# Patient Record
Sex: Female | Born: 1985
Health system: Southern US, Community
[De-identification: ages and names within clinical notes are randomized; demographics above are authoritative.]

## PROBLEM LIST (undated history)

## (undated) DIAGNOSIS — D509 Iron deficiency anemia, unspecified: Secondary | ICD-10-CM

## (undated) DIAGNOSIS — J45909 Unspecified asthma, uncomplicated: Secondary | ICD-10-CM

## (undated) DIAGNOSIS — E538 Deficiency of other specified B group vitamins: Secondary | ICD-10-CM

## (undated) DIAGNOSIS — F329 Major depressive disorder, single episode, unspecified: Secondary | ICD-10-CM

## (undated) DIAGNOSIS — F419 Anxiety disorder, unspecified: Secondary | ICD-10-CM

## (undated) DIAGNOSIS — E282 Polycystic ovarian syndrome: Secondary | ICD-10-CM

## (undated) DIAGNOSIS — O24419 Gestational diabetes mellitus in pregnancy, unspecified control: Secondary | ICD-10-CM

## (undated) DIAGNOSIS — K219 Gastro-esophageal reflux disease without esophagitis: Secondary | ICD-10-CM

## (undated) DIAGNOSIS — F32A Depression, unspecified: Secondary | ICD-10-CM

## (undated) DIAGNOSIS — Z803 Family history of malignant neoplasm of breast: Secondary | ICD-10-CM

## (undated) HISTORY — DX: Depression, unspecified: F32.A

## (undated) HISTORY — DX: Unspecified asthma, uncomplicated: J45.909

## (undated) HISTORY — DX: Gestational diabetes mellitus in pregnancy, unspecified control: O24.419

## (undated) HISTORY — DX: Iron deficiency anemia, unspecified: D50.9

## (undated) HISTORY — DX: Family history of malignant neoplasm of breast: Z80.3

## (undated) HISTORY — DX: Deficiency of other specified B group vitamins: E53.8

---
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
## 2007-05-03 ENCOUNTER — Encounter: Payer: Self-pay | Admitting: Internal Medicine

## 2007-05-03 ENCOUNTER — Ambulatory Visit: Payer: Self-pay | Admitting: Internal Medicine

## 2007-05-03 ENCOUNTER — Other Ambulatory Visit: Admission: RE | Admit: 2007-05-03 | Discharge: 2007-05-03 | Payer: Self-pay | Admitting: Internal Medicine

## 2007-05-03 DIAGNOSIS — N946 Dysmenorrhea, unspecified: Secondary | ICD-10-CM | POA: Insufficient documentation

## 2007-05-03 DIAGNOSIS — E049 Nontoxic goiter, unspecified: Secondary | ICD-10-CM | POA: Insufficient documentation

## 2007-05-03 LAB — CONVERTED CEMR LAB
Thyroglobulin Ab: 34.9 (ref 0.0–60.0)
Thyroperoxidase Ab SerPl-aCnc: <25 (ref 0.0–60.0)

## 2007-05-08 LAB — CONVERTED CEMR LAB
ALT: 12 units/L (ref 0–35)
AST: 15 units/L (ref 0–37)
Albumin: 3.5 g/dL (ref 3.5–5.2)
Alkaline Phosphatase: 59 units/L (ref 39–117)
BUN: 11 mg/dL (ref 6–23)
Basophils Absolute: 0.1 10*3/uL (ref 0.0–0.1)
Basophils Relative: 0.9 % (ref 0.0–1.0)
Bilirubin, Direct: 0.1 mg/dL (ref 0.0–0.3)
CO2: 25 meq/L (ref 19–32)
Calcium: 9.2 mg/dL (ref 8.4–10.5)
Chloride: 107 meq/L (ref 96–112)
Cholesterol: 133 mg/dL (ref 0–200)
Creatinine, Ser: 0.8 mg/dL (ref 0.4–1.2)
Eosinophils Absolute: 0.1 10*3/uL (ref 0.0–0.6)
Eosinophils Relative: 1.2 % (ref 0.0–5.0)
Free T4: 1 ng/dL (ref 0.6–1.6)
GFR calc Af Amer: 119 mL/min
GFR calc non Af Amer: 98 mL/min
Glucose, Bld: 90 mg/dL (ref 70–99)
HCT: 37.3 % (ref 36.0–46.0)
HDL: 31.3 mg/dL — ABNORMAL LOW (ref >39.0)
Hemoglobin: 12.8 g/dL (ref 12.0–15.0)
LDL Cholesterol: 90 mg/dL (ref 0–99)
Lymphocytes Relative: 24.1 % (ref 12.0–46.0)
MCHC: 34.3 g/dL (ref 30.0–36.0)
MCV: 83.6 fL (ref 78.0–100.0)
Monocytes Absolute: 0.4 10*3/uL (ref 0.2–0.7)
Monocytes Relative: 5.6 % (ref 3.0–11.0)
Neutro Abs: 4.3 10*3/uL (ref 1.4–7.7)
Neutrophils Relative %: 68.2 % (ref 43.0–77.0)
Platelets: 228 10*3/uL (ref 150–400)
Potassium: 3.8 meq/L (ref 3.5–5.1)
RBC: 4.46 M/uL (ref 3.87–5.11)
RDW: 11.8 % (ref 11.5–14.6)
Sodium: 139 meq/L (ref 135–145)
TSH: 0.99 microintl units/mL (ref 0.35–5.50)
Total Bilirubin: 0.6 mg/dL (ref 0.3–1.2)
Total CHOL/HDL Ratio: 4.2
Total Protein: 6.8 g/dL (ref 6.0–8.3)
Triglycerides: 60 mg/dL (ref 0–149)
VLDL: 12 mg/dL (ref 0–40)
WBC: 6.5 10*3/uL (ref 4.5–10.5)

## 2007-05-24 ENCOUNTER — Encounter: Payer: Self-pay | Admitting: Internal Medicine

## 2007-05-25 ENCOUNTER — Telehealth: Payer: Self-pay | Admitting: *Deleted

## 2007-07-19 ENCOUNTER — Ambulatory Visit: Payer: Self-pay | Admitting: Internal Medicine

## 2007-07-19 DIAGNOSIS — R51 Headache: Secondary | ICD-10-CM | POA: Insufficient documentation

## 2007-07-19 DIAGNOSIS — R519 Headache, unspecified: Secondary | ICD-10-CM | POA: Insufficient documentation

## 2007-07-19 DIAGNOSIS — E786 Lipoprotein deficiency: Secondary | ICD-10-CM | POA: Insufficient documentation

## 2007-07-19 DIAGNOSIS — J069 Acute upper respiratory infection, unspecified: Secondary | ICD-10-CM | POA: Insufficient documentation

## 2007-08-17 ENCOUNTER — Ambulatory Visit: Payer: Self-pay | Admitting: Internal Medicine

## 2007-08-17 DIAGNOSIS — N926 Irregular menstruation, unspecified: Secondary | ICD-10-CM | POA: Insufficient documentation

## 2007-08-17 DIAGNOSIS — N83209 Unspecified ovarian cyst, unspecified side: Secondary | ICD-10-CM | POA: Insufficient documentation

## 2007-08-23 ENCOUNTER — Telehealth: Payer: Self-pay | Admitting: Internal Medicine

## 2007-09-26 ENCOUNTER — Telehealth: Payer: Self-pay | Admitting: Internal Medicine

## 2007-10-16 ENCOUNTER — Ambulatory Visit (HOSPITAL_COMMUNITY): Admission: RE | Admit: 2007-10-16 | Discharge: 2007-10-16 | Payer: Self-pay | Admitting: Obstetrics and Gynecology

## 2009-06-16 ENCOUNTER — Ambulatory Visit: Payer: Self-pay | Admitting: Internal Medicine

## 2009-06-16 DIAGNOSIS — K645 Perianal venous thrombosis: Secondary | ICD-10-CM | POA: Insufficient documentation

## 2009-12-21 ENCOUNTER — Emergency Department: Payer: Self-pay | Admitting: Emergency Medicine

## 2009-12-24 ENCOUNTER — Ambulatory Visit: Payer: Self-pay | Admitting: Internal Medicine

## 2009-12-24 DIAGNOSIS — M79609 Pain in unspecified limb: Secondary | ICD-10-CM | POA: Insufficient documentation

## 2009-12-24 DIAGNOSIS — M542 Cervicalgia: Secondary | ICD-10-CM | POA: Insufficient documentation

## 2009-12-29 ENCOUNTER — Telehealth: Payer: Self-pay | Admitting: *Deleted

## 2010-01-08 ENCOUNTER — Encounter: Payer: Self-pay | Admitting: Internal Medicine

## 2010-02-02 ENCOUNTER — Encounter: Payer: Self-pay | Admitting: Internal Medicine

## 2010-03-02 ENCOUNTER — Encounter: Payer: Self-pay | Admitting: Internal Medicine

## 2010-07-12 ENCOUNTER — Encounter: Payer: Self-pay | Admitting: Endocrinology

## 2010-07-21 NOTE — Letter (Signed)
Summary: [facility]  [facility]   Imported By: Maryln Gottron 02/13/2010 12:16:09  _____________________________________________________________________  External Attachment:    Type:   Image     Comment:   External Document

## 2010-07-21 NOTE — Progress Notes (Signed)
Summary: diag  Phone Note Other Incoming Call back at [phone]   Call placed by: Shelby Aguilar Call placed to: Shelby Aguilar Summary of Call: they need more specific dig in order to complete Shelby Aguilar paper work for a medical leave at [employer].  Would you please fax to [phone], make atten to Shelby Aguilar. Initial call taken by: Shelby Aguilar,  September 26, 2007 8:55 AM  Follow-up for Phone Call        Per md- Call pt and see what the GYN said and let her know what is going on. Left message to call back. Follow-up by: Shelby Aguilar, CMA,  September 26, 2007 12:54 PM  Additional Follow-up for Phone Call Additional follow up Details #1::        Spoke to pt- pt saw a gyn- they didn't do anything just talked to her. She doesn't remember who she seen but they thought she was there because she was pregnant. Her grandmother was suppose to make her another appt but hasn't yet. I told her to call Shelby Aguilar and let her know what is going on and to call us back once she gets seen with a gyn about this problem then we would be able to do another letter. I also told pt that we would call over to [facility] as well and let her know that we have referred her to a gyn and once we get a report from them, we will be happy to do another letter. I spoke with Shelby Aguilar at [facility] and she said that the would hold her file until they get the letter from [place]. Additional Follow-up by: Shelby Aguilar, CMA,  September 26, 2007 2:26 PM

## 2010-07-21 NOTE — Letter (Signed)
Summary: Out of Work  Adult nurse at [employer]  [address]   [place], [place] [address]   Phone: [phone]  Fax: [phone]    December 24, 2009   Employee:  LESLIE JESTER    To Whom It May Concern:   For Medical reasons, please excuse the above named employee from  physical work for the following dates:  Start:   December 24 2009  End:   December 31 2009  If you need additional information, please feel free to contact our office.         Sincerely,    Madelin Headings MD

## 2010-07-21 NOTE — Letter (Signed)
Summary: Out of Work  Adult nurse at [employer]  [address]   [place], [place] [address]   Phone: [phone]  Fax: [phone]    December 24, 2009   Employee:  ALIVEAH GALLANT    To Whom It May Concern:   For Medical reasons, please excuse the above named employee from work for the following dates:  Start:    End:    If you need additional information, please feel free to contact our office.         Sincerely,    Madelin Headings MD

## 2010-07-21 NOTE — Letter (Signed)
Summary: medical release  medical release   Imported By: Kassie Mends 06/16/2007 14:58:18  _____________________________________________________________________  External Attachment:    Type:   Image     Comment:   medical release

## 2010-07-21 NOTE — Assessment & Plan Note (Signed)
Summary: panosh pt w rectal bleed/mae   Vital Signs:  Patient profile:   25 year old female Height:      62.75 inches Weight:      154 pounds BMI:     27.60 Temp:     98.2 degrees F oral Pulse rate:   72 / minute Resp:     14 per minute BP sitting:   110 / 76  (left arm)  Vitals Entered By: Willy Eddy, LPN (June 16, 2009 4:11 PM)  CC:  c/obright red rectal bleeding with bm 3 times.  History of Present Illness: PRESENTS FOR RECTAL BLEEDING no hx of hemmorhoids has been mildly constipated has a hx anal sex in the past had several episodes of bleeding with blood on the tissue and in the toilet had a similar episode that was diagnosed as a hemmorrhoid   Problems Prior to Update: 1)  Ovarian Cyst  (ICD-620.2) 2)  Irregular Menstrual Cycle  (ICD-626.4) 3)  Low Hdl  (ICD-272.5) 4)  Headache  (ICD-784.0) 5)  Uri  (ICD-465.9) 6)  Oral Contraception  (ICD-V25.41) 7)  Dysmenorrhea  (ICD-625.3) 8)  Well Woman  (ICD-V70.0) 9)  Goiter, Unspecified  (ICD-240.9) 10)  Family History Diabetes 1st Degree Relative  (ICD-V18.0)  Medications Prior to Update: 1)  None  Current Medications (verified): 1)  None  Allergies (verified): 1)  ! Penicillin  Past History:  Family History: Last updated: 05/03/2007 Family History Diabetes 1st degree relative Family History Hypertension no blood clots   Social History: Last updated: 05/03/2007 Occupation: Consulting civil engineer at [employer]  Never Smoked  Risk Factors: Smoking Status: never (05/03/2007)  Past medical, surgical, family and social histories (including risk factors) reviewed, and no changes noted (except as noted below).  Past Medical History: Reviewed history from 07/19/2007 and no changes required. ?EIA 9#1/2 BW  Family History: Reviewed history from 05/03/2007 and no changes required. Family History Diabetes 1st degree relative Family History Hypertension no blood clots   Social History: Reviewed history from  05/03/2007 and no changes required. Occupation: Consulting civil engineer at [employer]  Review of Systems       The patient complains of hematochezia.  The patient denies anorexia, fever, weight loss, weight gain, vision loss, decreased hearing, hoarseness, chest pain, syncope, dyspnea on exertion, peripheral edema, prolonged cough, headaches, hemoptysis, abdominal pain, melena, severe indigestion/heartburn, hematuria, incontinence, genital sores, muscle weakness, suspicious skin lesions, transient blindness, difficulty walking, depression, unusual weight change, abnormal bleeding, enlarged lymph nodes, angioedema, and breast masses.    Physical Exam  General:  Well-developed,well-nourished,in no acute distress; alert,appropriate and cooperative throughout examination Head:  normocephalic and atraumatic.   Eyes:  vision grossly intact, pupils equal, and pupils round.   Ears:  R ear normal, L ear normal, and ear piercing(s) noted.   Nose:  mucosal erythema and mucosal edema.  face nt Mouth:  pharynx pink and moist.  no lesions Neck:  No deformities, masses, or tenderness noted. Lungs:  Normal respiratory effort, chest expands symmetrically. Lungs are clear to auscultation, no crackles or wheezes. Heart:  Normal rate and regular rhythm. S1 and S2 normal without gallop, murmur, click, rub or other extra sounds. Rectal:  internal hemorrhoid(s).   no external lesions Genitalia:  normal introitus and no external lesions.    no apparent vaginal bleeding   Impression & Recommendations:  Problem # 1:  INTERNAL THROMBOSED HEMORRHOIDS (ICD-455.1)   pt gave informed consent and anascopy was performed with my nurse present internal thrombosed hemorrhoids  were noted  and scarring from prior hemorrhoids noted pt informed of results  Orders: Anoscopy ([address])  Complete Medication List: 1)  Hydrocortisone Ace-pramoxine 2.5-1 % Crea (Hydrocortisone ace-pramoxine) .... Apply pr internally two times a day for  7 days  Patient Instructions: 1)  keep stools soft and use cream for one week even if the bleeding stops Prescriptions: HYDROCORTISONE ACE-PRAMOXINE 2.5-1 % CREA (HYDROCORTISONE ACE-PRAMOXINE) apply PR internally two times a day for 7 days  #1 tube x 1   Entered and Authorized by:   Stacie Glaze MD   Signed by:   Stacie Glaze MD on 06/16/2009   Method used:   Electronically to        [facility]. [address] [phone]* (retail)       [address] [place], [place]  [id number]       Ph: [phone]       Fax: [phone]   RxID:   [id number]

## 2010-07-21 NOTE — Consult Note (Signed)
Summary: [facility]  [facility]   Imported By: Maryln Gottron 01/20/2010 10:43:14  _____________________________________________________________________  External Attachment:    Type:   Image     Comment:   External Document
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
## 2010-07-21 NOTE — Progress Notes (Signed)
Summary: wants referral  Phone Note Call from Patient Call back at [phone]   Caller: linda---grandmother---live call Summary of Call: still having back pain. was told by dr Fabian Sharp that a referral to a chiropractor can be done. please advise. she is unable to come in today. Initial call taken by: Warnell Forester,  December 29, 2009 8:41 AM  Follow-up for Phone Call        I would  do orthopedist /( and then  Physical therapy) referral first.  She can  do a chiro appt on her own.   May we do a referral ? Follow-up by: Madelin Headings MD,  December 29, 2009 5:11 PM  Additional Follow-up for Phone Call Additional follow up Details #1::        Pt's grandmother aware and order sent to [facility]. Pt had appt on Wed with Dr. Fabian Sharp but we went ahead and cancelled it. Additional Follow-up by: Romualdo Bolk, CMA (AAMA),  December 29, 2009 5:23 PM

## 2010-07-24 NOTE — Letter (Signed)
Summary: [facility]  [facility]   Imported By: Maryln Gottron 03/18/2010 09:14:04  _____________________________________________________________________  External Attachment:    Type:   Image     Comment:   External Document

## 2010-11-06 NOTE — Letter (Signed)
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  Unfortunately, she  developed a medical problem during her study abroad at [place] that  required her to seek care at the local hospital.  She did need to return  home for further care and I have since  referred her to a specialist.   If you require more specifics about her medical condition, please let us  know.  I suspect that she would be able to return to classes within a  month or so, however, because of her study abroad, she would not be able  to return to [place].  Therefore, she is requesting withdrawal for  medical reasons, which I support.   Please let us know if we can be of more help.    Sincerely,      Shelby Mends. Panosh, MD  Electronically Signed    WKP/MedQ  DD: 08/24/2007  DT: 08/24/2007  Job #: [id number]

## 2011-10-20 ENCOUNTER — Observation Stay: Payer: Self-pay | Admitting: Obstetrics and Gynecology

## 2011-10-20 LAB — URINALYSIS, COMPLETE
Bilirubin,UR: NEGATIVE
Glucose,UR: 150 mg/dL (ref 0–75)
Ketone: NEGATIVE
Leukocyte Esterase: NEGATIVE
Nitrite: NEGATIVE
Ph: 6 (ref 4.5–8.0)
Protein: 100
RBC,UR: 3281 /HPF (ref 0–5)
Specific Gravity: 1.021 (ref 1.003–1.030)
Squamous Epithelial: 3
WBC UR: 34 /HPF (ref 0–5)

## 2011-10-23 LAB — URINE CULTURE

## 2011-12-14 ENCOUNTER — Inpatient Hospital Stay: Payer: Self-pay | Admitting: Obstetrics & Gynecology

## 2011-12-15 LAB — CBC WITH DIFFERENTIAL/PLATELET
Basophil #: 0 x10 3/mm 3 (ref 0.0–0.1)
Basophil %: 0.1 %
Eosinophil #: 0.3 x10 3/mm 3 (ref 0.0–0.7)
Eosinophil %: 1.9 %
HCT: 37.2 % (ref 35.0–47.0)
HGB: 12.4 g/dL (ref 12.0–16.0)
Lymphocyte %: 13.8 %
Lymphs Abs: 2 x10 3/mm 3 (ref 1.0–3.6)
MCH: 27.8 pg (ref 26.0–34.0)
MCHC: 33.2 g/dL (ref 32.0–36.0)
MCV: 84 fL (ref 80–100)
Monocyte #: 0.9 x10 3/mm (ref 0.2–0.9)
Monocyte %: 6.6 %
Neutrophil #: 11 x10 3/mm 3 — ABNORMAL HIGH (ref 1.4–6.5)
Neutrophil %: 77.6 %
Platelet: 195 x10 3/mm 3 (ref 150–440)
RBC: 4.45 X10 6/mm 3 (ref 3.80–5.20)
RDW: 14.2 % (ref 11.5–14.5)
WBC: 14.2 x10 3/mm 3 — ABNORMAL HIGH (ref 3.6–11.0)

## 2011-12-16 LAB — HEMATOCRIT: HCT: 25.8 % — ABNORMAL LOW (ref 35.0–47.0)

## 2014-10-13 NOTE — Consult Note (Signed)
PATIENT NAME:  Shelby Aguilar, Shelby Aguilar MR#:  [id number] DATE OF BIRTH:  [birth year]  DATE OF CONSULTATION:  10/21/2011  REFERRING PHYSICIAN:  Dr. Bonney AidStaebler CONSULTING PHYSICIAN:  Suszanne ConnersMichael R. Evelene CroonWolff, MD  REASON FOR CONSULTATION: Kidney stones.   HISTORY OF PRESENT ILLNESS: [name] is a 29 year old African American female with sudden onset of right lower quadrant abdominal pain radiating to her right upper quadrant last night. She denied nausea, vomiting, fever, chills, or hematuria. She denies past history of kidney stones or urinary tract infections. She had an abdominal ultrasound today which by report indicated moderate to severe right hydronephrosis but an obstructing stone was not identified. At the present time the patient is pain free.   ALLERGIES: No drug allergies.   CURRENT MEDICATIONS: Prenatal vitamins.   PAST SURGICAL HISTORY: No previous surgical procedures.   SOCIAL HISTORY: Patient denied tobacco or alcohol use.   FAMILY HISTORY: Negative for kidney disease or stones.   PAST AND CURRENT MEDICAL CONDITIONS: Patient denied heart disease, lung disease, diabetes or kidney disease. She does have a history of a questionable hyperthyroidism.   PHYSICAL EXAMINATION:  GENITOURINARY: She had a gravid uterus consistent with 27 weeks of pregnancy. No CVA tenderness.   LABORATORY, DIAGNOSTIC AND RADIOLOGICAL DATA: Urinalysis indicated 34 WBCs per field and +3 blood.   IMPRESSION:  1. Right hydronephrosis per ultrasound.  2. Possible right ureterolithiasis.   SUGGESTIONS:  1. Discussed kidney stone disease during pregnancy with the patient. Specifically discussed indications for urological intervention with her such as intractable pain, intractable nausea and vomiting and urinary sepsis.  2. Patient is pain free at the present time and may be discharged home when stable from the obstetric standpoint.  3. Patient was instructed to drink plenty of fluids and strain her urine. 4. If she  develops any significant symptoms that could complicate her pregnancy then she is to contact myself or her OB/GYN.  5. After she delivers she will need a KUB to determine if the stone is still present.  ____________________________ Suszanne ConnersMichael R. Evelene CroonWolff, MD mrw:cms D: 10/21/2011 16:38:44 ET T: 10/22/2011 08:11:37 ET JOB#: [id number]  cc: Suszanne ConnersMichael R. Evelene CroonWolff, MD, <Dictator> Florina OuAndreas Aguilar. Bonney AidStaebler, MD Orson ApeMICHAEL R Sorren Vallier MD ELECTRONICALLY SIGNED 10/22/2011 11:13

## 2014-10-13 NOTE — Consult Note (Signed)
Brief Consult Note: Diagnosis: R hydronephrosis/possible ureterolithiasis.   Patient was seen by consultant.   Consult note dictated.   Orders entered.   Discussed with Attending MD.   Comments: Kidney stone management during pregnancy discussed with patient and she was given a brochure to take home. Patient is pain-free now and may be discharged home from my standpoint.  Electronic Signatures: Orson ApeWolff, Michael R (MD)  (Signed 02-May-13 16:27)  Authored: Brief Consult Note   Last Updated: 02-May-13 16:27 by Orson ApeWolff, Michael R (MD)

## 2014-10-13 NOTE — Op Note (Signed)
PATIENT NAME:  Shelby Aguilar, Shelby Aguilar MR#:  [id number] DATE OF BIRTH:  03-21-1986  DATE OF PROCEDURE:  12/15/2011  PREOPERATIVE DIAGNOSES:  1. Term intrauterine pregnancy at 39 weeks 5 days gestation.  2. Failed induction of labor with active phase arrest deemed secondary to cephalopelvic disproportion.   POSTOPERATIVE DIAGNOSES:  1. Term intrauterine pregnancy at 39 weeks 5 days gestation.  2. Failed induction of labor with active phase arrest deemed secondary to cephalopelvic disproportion.  3. True knot in umbilical cord.   PROCEDURE: Primary low transverse C-section via Pfannenstiel skin incision. .   ANESTHESIA: Epidural.   PRIMARY SURGEON: Florina Ou. Bonney Aid, MD  ASSISTANT: Shella Maxim, CNM    ESTIMATED BLOOD LOSS: 650 mL.   OPERATIVE FLUIDS: 100 mL.   DRAINS OR TUBES: Foley to gravity drainage. On-Q catheter system.   IMPLANTS: None.   SPECIMENS REMOVED: None.   COMPLICATIONS: None.   INTRAOPERATIVE FINDINGS: Normal tubes, ovaries, and uterus. Delivery of a liveborn female infant weighing 3990 grams, 8 pounds 13 ounces, Apgars 9 and 9.   POSTOPERATIVE CONDITION: Stable.   PROCEDURE IN DETAIL: Risks, benefits, and alternatives of the procedure were discussed with the patient prior to proceeding to the Operating Room. The patient displayed reassuring fetal surveillance but had stalled labor at approximately 5 cm cervical dilation with no cervical change despite adequate MVU's. After discussion of further attempts to labor versus proceeding with operative delivery via cesarean section, the patient opted to proceed with cesarean section for delivery. Patient was taken back to the Operating Room her epidural was dosed. Once the level of anesthetic was noted to be adequate the patient was prepped and draped in the usual sterile fashion and positioned in the supine position. A Pfannenstiel skin incision was made 2 cm above the umbilicus and carried down sharply to the level of the  rectus fascia with the knife. The fascia was incised in the midline using the knife. The fascial incision was then extended using Mayo scissors. The superior border of the rectus fascia was grasped with two Kocher clamps. The underlying rectus muscle was dissected off the rectus fascia bluntly and the median raphe incised using Mayo scissors. Attention was then turned to the inferior border of the rectus fascia which was dissected off the rectus muscles in a similar fashion. The midline was identified and the peritoneum was entered bluntly. The peritoneal incision was stretched using manual traction. A bladder blade was placed. The operator's hand was placed into the abdomen noting the uterus to be in nonrotated position. A bladder flap was created using Metzenbaum scissors and further developed digitally and the bladder blade was replaced. A low transverse hysterotomy incision was made. The uterus was entered bluntly using the operator's finger. Clear fluid was noted. The infant's head was noted to be in the OA position. The vertex was grasped, flexed, brought to the incision, and delivered atraumatically using fundal pressure. The infant's cord was clamped and cut and the infant was suctioned before being passed to the awaiting pediatrician. Following delivery of the infant, cord blood was obtained. The placenta was delivered using manual extraction. The uterus was exteriorized and wiped clean of clots and debris. The hysterotomy incision was closed in two layers using 0 Vicryl with the first layer being a running locked stitch, the second a horizontal imbricating. Following closure the hysterotomy was inspected and noted to be hemostatic. The uterus was returned to the abdomen. The peritoneal gutters were wiped clean of clots and debris using two  moist laps. The peritoneum was then closed using a 2-0 Vicryl in a running fashion. The rectus muscles were inspected and noted to be hemostatic. The On-Q catheter  system was then placed using the On-Q trocars. The On-Q trocars were introduced a midline approximately 4 cm superior to the superior border of the Pfannenstiel skin incision. The trocars were removed and the catheters threaded through the introducers and introducers were subsequently removed. Following this the rectus fascia was closed using a running stitch of #1 loop PDS. Subcutaneous tissue was irrigated and hemostasis achieved using the Bovie. The skin was closed using a 4-0 Monocryl in a subcuticular fashion. The On-Q catheters were dressed with Dermabond, Steri-Strips and a Tegaderm. The Pfannenstiel skin incision was dressed using Steri-Strips. Following this the On-Q catheters were bolused with 5 mL of 0.5% bupivacaine each. Sponge, needle, and instrument counts were correct x2. The patient tolerated the procedure well and was taken to recovering room in stable condition.   ____________________________ Florina OuAndreas M. Bonney AidStaebler, MD ams:cms D: 12/15/2011 23:52:35 ET T: 12/16/2011 09:45:56 ET JOB#: [id number]  cc: Florina OuAndreas M. Bonney AidStaebler, MD, <Dictator>  Carmel SacramentoANDREAS Cathrine MusterM Raela Bohl MD ELECTRONICALLY SIGNED 12/25/2011 8:34

## 2014-10-22 DIAGNOSIS — E282 Polycystic ovarian syndrome: Secondary | ICD-10-CM | POA: Insufficient documentation

## 2014-10-29 NOTE — H&P (Signed)
L&D Evaluation:  History Expanded:   HPI 29 yo G1 at 1432 1/7 weeks with EDD of 12/18/11 per early US. Pt presents with c/o right low abdominal and back pain and noting blood in toilet after voiding. [facility], ASCUS + HPV pap (Colp neg, repeat pap neg/+ HPV), UTI tx in October, elevated 1 hr with normal 3 hr GTT.    Blood Type A positive    Group B Strep Results (Result >5wks must be treated as unknown) unknown/result > 5 weeks ago    Maternal HIV Negative    Maternal Syphilis Ab Nonreactive    Maternal Varicella Immune    Rubella Results immune    Maternal T-Dap Immune    Patient's Medical History enlarged thyroid (labs nl this pregnancy)    Medications Pre Natal Vitamins    Allergies PCN    Social History none    Family History Non-Contributory   ROS:   ROS see HPI   Exam:   Vital Signs stable    General no apparent distress    Mental Status clear    Chest clear    Heart no murmur/gallop/rubs    Abdomen gravid, tender RLQ    Back no CVAT    Pelvic closed per RN, no vaginal blood noted while checking    Mebranes Intact    FHT normal rate with no decels, category 1    Ucx absent    Other UA: 150 glucose, 3+ blood, 100 protein, 3281 RBCs, 34 WBC, 1+ bacteria, 3 epithelial Renal US: right hydronephrosis, questionable right nonobstructive nephrolithiasis   Impression:   Impression nephrolithiasis   Plan:   Comments Urology consult in am Percocet prn for pain Transfer to mother-baby floor   Electronic Signatures: Vella KohlerBrothers, Ioane Bhola K (CNM)  (Signed 02-May-13 03:50)  Authored: L&D Evaluation   Last Updated: 02-May-13 03:50 by Vella KohlerBrothers, Cataleia Gade K (CNM)

## 2015-06-24 DIAGNOSIS — E669 Obesity, unspecified: Secondary | ICD-10-CM | POA: Insufficient documentation

## 2017-06-17 ENCOUNTER — Encounter: Payer: Self-pay | Admitting: Obstetrics and Gynecology

## 2017-06-17 ENCOUNTER — Ambulatory Visit (INDEPENDENT_AMBULATORY_CARE_PROVIDER_SITE_OTHER): Payer: 59 | Admitting: Obstetrics and Gynecology

## 2017-06-17 DIAGNOSIS — Z1239 Encounter for other screening for malignant neoplasm of breast: Secondary | ICD-10-CM

## 2017-06-17 DIAGNOSIS — Z113 Encounter for screening for infections with a predominantly sexual mode of transmission: Secondary | ICD-10-CM | POA: Diagnosis not present

## 2017-06-17 DIAGNOSIS — Z1231 Encounter for screening mammogram for malignant neoplasm of breast: Secondary | ICD-10-CM | POA: Diagnosis not present

## 2017-06-17 DIAGNOSIS — Z01419 Encounter for gynecological examination (general) (routine) without abnormal findings: Secondary | ICD-10-CM | POA: Diagnosis not present

## 2017-06-17 DIAGNOSIS — Z124 Encounter for screening for malignant neoplasm of cervix: Secondary | ICD-10-CM | POA: Diagnosis not present

## 2017-06-17 NOTE — Progress Notes (Signed)
Patient ID: Shelby Aguilar, female   DOB: 22-Dec-1985, 31 y.o.   MRN: [id number]     Gynecology Annual Exam  PCP: Madelin HeadingsPanosh, Wanda K, MD  Chief Complaint:  Chief Complaint  Patient presents with  . Gynecologic Exam    Discuss fertility    History of Present Illness: Patient is a 31 y.o. G1P1001 presents for annual exam. The patient has no complaints today.   LMP: Patient's last menstrual period was 06/11/2017. Regular monthly, 4-5 days, light flow, positive moliminal symptoms  The patient is sexually active. She currently uses none for contraception. She denies dyspareunia.  The patient does perform self breast exams.  There is notable family history of breastcancer in her paternal grandmother in her 6440's.  The patient wears seatbelts: yes.   The patient has regular exercise: no, but just got gym membership.   The patient denies current symptoms of depression.    Review of Systems: ROS  Past Medical History:  Past Medical History:  Diagnosis Date  . Asthma     Past Surgical History:  Past Surgical History:  Procedure Laterality Date  . CESAREAN SECTION  2013    Gynecologic History:  Patient's last menstrual period was 06/11/2017. Contraception: none Nexplanon removed 02/12/2015 Last Pap:  03/18/2014 negative colposcopy 07/10/2013 CIN II 06/06/2013 ASCUS HPV positive 01/26/2012 NIL  Obstetric History: G1P1001  Family History:  Family History  Problem Relation Age of Onset  . Breast cancer Paternal Grandmother 5240    Social History:  Social History   Socioeconomic History  . Marital status: Married    Spouse name: Not on file  . Number of children: Not on file  . Years of education: Not on file  . Highest education level: Not on file  Social Needs  . Financial resource strain: Not on file  . Food insecurity - worry: Not on file  . Food insecurity - inability: Not on file  . Transportation needs - medical: Not on file  . Transportation needs -  non-medical: Not on file  Occupational History  . Not on file  Tobacco Use  . Smoking status: Never Smoker  . Smokeless tobacco: Never Used  Substance and Sexual Activity  . Alcohol use: Yes  . Drug use: No  . Sexual activity: Yes    Partners: Male    Birth control/protection: None  Other Topics Concern  . Not on file  Social History Narrative  . Not on file    Allergies:  Allergies  Allergen Reactions  . Penicillins     Medications: Prior to Admission medications   Not on File    Physical Exam Vitals: Blood pressure 116/64, pulse (!) 102, height 5\' 3"  (1.6 m), weight 226 lb (102.5 kg), last menstrual period 06/11/2017. Body mass index is 40.03 kg/m.  General: NAD HEENT: normocephalic, anicteric Thyroid: no enlargement, no palpable nodules Pulmonary: No increased work of breathing, CTAB Cardiovascular: RRR, distal pulses 2+ Breast: Breast symmetrical, no tenderness, no palpable nodules or masses, no skin or nipple retraction present, no nipple discharge.  No axillary or supraclavicular lymphadenopathy. Abdomen: NABS, soft, non-tender, non-distended.  Umbilicus without lesions.  No hepatomegaly, splenomegaly or masses palpable. No evidence of hernia  Genitourinary:  External: Normal external female genitalia.  Normal urethral meatus, normal  Bartholin's and Skene's glands.    Vagina: Normal vaginal mucosa, no evidence of prolapse.    Cervix: Grossly normal in appearance, no bleeding  Uterus: Non-enlarged, mobile, normal contour.  No CMT  Adnexa: ovaries non-enlarged,  no adnexal masses  Rectal: deferred  Lymphatic: no evidence of inguinal lymphadenopathy Extremities: no edema, erythema, or tenderness Neurologic: Grossly intact Psychiatric: mood appropriate, affect full  Female chaperone present for pelvic and breast  portions of the physical exam    Assessment: 31 y.o. G1P1001 routine annual exam  Plan: Problem List Items Addressed This Visit    None      Visit Diagnoses    Screening for malignant neoplasm of cervix       Relevant Orders   Pap Lb, Ct-Ng, rfx HPV ASCU   Breast screening       Encounter for gynecological examination without abnormal finding       Relevant Orders   Pap Lb, Ct-Ng, rfx HPV ASCU   HEP, RPR, HIV Panel   Screen for STD (sexually transmitted disease)       Relevant Orders   Pap Lb, Ct-Ng, rfx HPV ASCU   HEP, RPR, HIV Panel      1) STI screening was offered and accepted  2) ASCCP guidelines and rational discussed.  Patient opts for every 3 years screening interval  3) Contraception - attempting to conceive, stressed PNV.  Discussed OPK's if no positive consider letrozole given prior diagnosis of PCOS  4) Routine healthcare maintenance including cholesterol, diabetes screening discussed managed by PCP  5) Follow up 1 year for routine annual exam

## 2017-06-17 NOTE — Patient Instructions (Signed)
Preventive Care 18-39 Years, Female Preventive care refers to lifestyle choices and visits with your health care provider that can promote health and wellness. What does preventive care include?  A yearly physical exam. This is also called an annual well check.  Dental exams once or twice a year.  Routine eye exams. Ask your health care provider how often you should have your eyes checked.  Personal lifestyle choices, including: ? Daily care of your teeth and gums. ? Regular physical activity. ? Eating a healthy diet. ? Avoiding tobacco and drug use. ? Limiting alcohol use. ? Practicing safe sex. ? Taking vitamin and mineral supplements as recommended by your health care provider. What happens during an annual well check? The services and screenings done by your health care provider during your annual well check will depend on your age, overall health, lifestyle risk factors, and family history of disease. Counseling Your health care provider may ask you questions about your:  Alcohol use.  Tobacco use.  Drug use.  Emotional well-being.  Home and relationship well-being.  Sexual activity.  Eating habits.  Work and work Statistician.  Method of birth control.  Menstrual cycle.  Pregnancy history.  Screening You may have the following tests or measurements:  Height, weight, and BMI.  Diabetes screening. This is done by checking your blood sugar (glucose) after you have not eaten for a while (fasting).  Blood pressure.  Lipid and cholesterol levels. These may be checked every 5 years starting at age 66.  Skin check.  Hepatitis C blood test.  Hepatitis B blood test.  Sexually transmitted disease (STD) testing.  BRCA-related cancer screening. This may be done if you have a family history of breast, ovarian, tubal, or peritoneal cancers.  Pelvic exam and Pap test. This may be done every 3 years starting at age 40. Starting at age 59, this may be done every 5  years if you have a Pap test in combination with an HPV test.  Discuss your test results, treatment options, and if necessary, the need for more tests with your health care provider. Vaccines Your health care provider may recommend certain vaccines, such as:  Influenza vaccine. This is recommended every year.  Tetanus, diphtheria, and acellular pertussis (Tdap, Td) vaccine. You may need a Td booster every 10 years.  Varicella vaccine. You may need this if you have not been vaccinated.  HPV vaccine. If you are 69 or younger, you may need three doses over 6 months.  Measles, mumps, and rubella (MMR) vaccine. You may need at least one dose of MMR. You may also need a second dose.  Pneumococcal 13-valent conjugate (PCV13) vaccine. You may need this if you have certain conditions and were not previously vaccinated.  Pneumococcal polysaccharide (PPSV23) vaccine. You may need one or two doses if you smoke cigarettes or if you have certain conditions.  Meningococcal vaccine. One dose is recommended if you are age 27-21 years and a first-year college student living in a residence hall, or if you have one of several medical conditions. You may also need additional booster doses.  Hepatitis A vaccine. You may need this if you have certain conditions or if you travel or work in places where you may be exposed to hepatitis A.  Hepatitis B vaccine. You may need this if you have certain conditions or if you travel or work in places where you may be exposed to hepatitis B.  Haemophilus influenzae type b (Hib) vaccine. You may need this if  you have certain risk factors.  Talk to your health care provider about which screenings and vaccines you need and how often you need them. This information is not intended to replace advice given to you by your health care provider. Make sure you discuss any questions you have with your health care provider. Document Released: 08/03/2001 Document Revised: 02/25/2016  Document Reviewed: 04/08/2015 Elsevier Interactive Patient Education  [employer].

## 2017-06-18 LAB — HEP, RPR, HIV PANEL
HIV Screen 4th Generation wRfx: NONREACTIVE
Hepatitis B Surface Ag: NEGATIVE
RPR Ser Ql: NONREACTIVE

## 2017-06-22 LAB — PAP LB, CT-NG, RFX HPV ASCU
Chlamydia, Nuc. Acid Amp: NEGATIVE
Gonococcus, Nuc. Acid Amp: NEGATIVE
PAP Smear Comment: 0

## 2017-08-09 ENCOUNTER — Ambulatory Visit: Payer: 59 | Admitting: Obstetrics and Gynecology

## 2017-08-22 ENCOUNTER — Other Ambulatory Visit: Payer: Self-pay

## 2017-08-22 ENCOUNTER — Encounter: Payer: Self-pay | Admitting: *Deleted

## 2017-08-22 ENCOUNTER — Ambulatory Visit
Admission: EM | Admit: 2017-08-22 | Discharge: 2017-08-22 | Disposition: A | Discharge status: Home / Self Care | Payer: 59 | Attending: Family Medicine | Admitting: Family Medicine

## 2017-08-22 DIAGNOSIS — J029 Acute pharyngitis, unspecified: Secondary | ICD-10-CM

## 2017-08-22 HISTORY — DX: Polycystic ovarian syndrome: E28.2

## 2017-08-22 LAB — RAPID STREP SCREEN (MED CTR MEBANE ONLY): Streptococcus, Group A Screen (Direct): NEGATIVE

## 2017-08-22 MED ORDER — FLUTICASONE PROPIONATE 50 MCG/ACT NA SUSP: 2.0000 | Freq: Every day | NASAL | 0 refills | Status: DC

## 2017-08-22 MED ORDER — HYDROCODONE-HOMATROPINE 5-1.5 MG/5ML PO SYRP: 5.0000 mL | ORAL_SOLUTION | Freq: Four times a day (QID) | ORAL | 0 refills | Status: DC | PRN

## 2017-08-22 NOTE — ED Provider Notes (Signed)
[facility]    CSN: [id number] Arrival date & time: 08/22/17  0815  History   Chief Complaint Chief Complaint  Patient presents with  . Sore Throat  . Cough   HPI  32 year old female presents with the above complaints.  Patient states that she is been sick since Thursday.  She is been worse since Saturday.  She reports sore throat which is severe.  Associated cough and body aches.  No fever.  No improvement with over-the-counter medication.  No known exacerbating factors.  No other associated symptoms.  No other complaints.  Past Medical History:  Diagnosis Date  . Asthma   . PCOS (polycystic ovarian syndrome)     Patient Active Problem List   Diagnosis Date Noted  . NECK AND BACK PAIN 12/24/2009  . ARM PAIN, LEFT 12/24/2009  . INTERNAL THROMBOSED HEMORRHOIDS 06/16/2009  . OVARIAN CYST 08/17/2007  . IRREGULAR MENSTRUAL CYCLE 08/17/2007  . LOW HDL 07/19/2007  . URI 07/19/2007  . HEADACHE 07/19/2007  . GOITER, UNSPECIFIED 05/03/2007  . DYSMENORRHEA 05/03/2007    Past Surgical History:  Procedure Laterality Date  . CESAREAN SECTION  2013    OB History    Gravida Para Term Preterm AB Living   1 1 1     1    SAB TAB Ectopic Multiple Live Births           1       Home Medications    Prior to Admission medications   Medication Sig Start Date End Date Taking? Authorizing Provider  albuterol (PROVENTIL HFA;VENTOLIN HFA) 108 (90 Base) MCG/ACT inhaler Inhale 2 puffs into the lungs every 6 (six) hours as needed for wheezing or shortness of breath.   Yes [provider]  fluticasone (FLONASE) 50 MCG/ACT nasal spray Place 2 sprays into both nostrils daily. 08/22/17   Tommie Samsook, Caelen Higinbotham G, DO  HYDROcodone-homatropine (HYCODAN) 5-1.5 MG/5ML syrup Take 5 mLs by mouth every 6 (six) hours as needed. 08/22/17   Tommie Samsook, Shimika Ames G, DO    Family History Family History  Problem Relation Age of Onset  . Breast cancer Paternal Grandmother 4140    Social History Social  History   Tobacco Use  . Smoking status: Never Smoker  . Smokeless tobacco: Never Used  Substance Use Topics  . Alcohol use: Yes  . Drug use: No     Allergies   Penicillins   Review of Systems Review of Systems  Constitutional: Negative for fever.  HENT: Positive for sore throat.   Respiratory: Positive for cough.   Musculoskeletal:       Body aches.   Physical Exam Triage Vital Signs ED Triage Vitals  Enc Vitals Group     BP 08/22/17 0839 117/68     Pulse Rate 08/22/17 0839 84     Resp 08/22/17 0839 16     Temp 08/22/17 0839 97.9 F (36.6 C)     Temp Source 08/22/17 0839 Oral     SpO2 08/22/17 0839 99 %     Weight 08/22/17 0841 220 lb (99.8 kg)     Height 08/22/17 0841 5\' 3"  (1.6 m)     Head Circumference --      Peak Flow --      Pain Score 08/22/17 0840 5     Pain Loc --      Pain Edu? --      Excl. in GC? --    Updated Vital Signs BP 117/68 (BP Location: Left Arm)  Pulse 84   Temp 97.9 F (36.6 C) (Oral)   Resp 16   Ht 5\' 3"  (1.6 m)   Wt 220 lb (99.8 kg)   LMP 08/10/2017   SpO2 99%   BMI 38.97 kg/m   Physical Exam  Constitutional: She is oriented to person, place, and time. She appears well-developed. No distress.  HENT:  Head: Normocephalic and atraumatic.  Oropharynx with mild erythema. Post nasal drip noted.  Eyes: Conjunctivae are normal. Right eye exhibits no discharge. Left eye exhibits no discharge.  Neck: Neck supple.  Cardiovascular: Normal rate and regular rhythm.  Pulmonary/Chest: Effort normal and breath sounds normal. She has no wheezes. She has no rales.  Lymphadenopathy:    She has no cervical adenopathy.  Neurological: She is alert and oriented to person, place, and time.  Psychiatric: She has a normal mood and affect. Her behavior is normal.  Nursing note and vitals reviewed.  UC Treatments / Results  Labs (all labs ordered are listed, but only abnormal results are displayed) Labs Reviewed  RAPID STREP SCREEN (NOT AT  [facility])  CULTURE, GROUP A STREP [facility])    EKG  EKG Interpretation None       Radiology No results found.  Procedures Procedures (including critical care time)  Medications Ordered in UC Medications - No data to display   Initial Impression / Assessment and Plan / UC Course  I have reviewed the triage vital signs and the nursing notes.  Pertinent labs & imaging results that were available during my care of the patient were reviewed by me and considered in my medical decision making (see chart for details).     32 year old female presents with a viral respiratory illness. Treating with flonase and hycodan.  Final Clinical Impressions(s) / UC Diagnoses   Final diagnoses:  Viral pharyngitis    ED Discharge Orders        Ordered    fluticasone (FLONASE) 50 MCG/ACT nasal spray  Daily     08/22/17 0907    HYDROcodone-homatropine (HYCODAN) 5-1.5 MG/5ML syrup  Every 6 hours PRN     08/22/17 0907     Controlled Substance Prescriptions [place] Controlled Substance Registry consulted? Not Applicable   Tommie Sams, [place] 08/22/17 1610

## 2017-08-22 NOTE — Discharge Instructions (Signed)
Strep negative.  This appears to be mostly from postnasal drip.   Use the medications as prescribed.  Take care  Dr. Adriana Simasook

## 2017-08-22 NOTE — ED Triage Notes (Signed)
Patient started having symptom of cough, sore throat and body aches 4 days ago.

## 2017-08-24 LAB — CULTURE, GROUP A STREP (THRC)

## 2017-11-17 ENCOUNTER — Ambulatory Visit (INDEPENDENT_AMBULATORY_CARE_PROVIDER_SITE_OTHER): Payer: 59 | Admitting: Advanced Practice Midwife

## 2017-11-17 ENCOUNTER — Encounter: Payer: Self-pay | Admitting: Advanced Practice Midwife

## 2017-11-17 VITALS — BP 126/82 | Wt 222.0 lb

## 2017-11-17 DIAGNOSIS — O99210 Obesity complicating pregnancy, unspecified trimester: Secondary | ICD-10-CM

## 2017-11-17 DIAGNOSIS — N912 Amenorrhea, unspecified: Secondary | ICD-10-CM

## 2017-11-17 DIAGNOSIS — Z6839 Body mass index (BMI) 39.0-39.9, adult: Secondary | ICD-10-CM

## 2017-11-17 DIAGNOSIS — Z113 Encounter for screening for infections with a predominantly sexual mode of transmission: Secondary | ICD-10-CM

## 2017-11-17 DIAGNOSIS — O0990 Supervision of high risk pregnancy, unspecified, unspecified trimester: Secondary | ICD-10-CM

## 2017-11-17 LAB — POCT URINE PREGNANCY: Preg Test, Ur: POSITIVE — AB

## 2017-11-17 NOTE — Progress Notes (Signed)
NOB Cramping Sharpe pain across chest when coughs

## 2017-11-17 NOTE — Patient Instructions (Signed)
Exercise During Pregnancy For people of all ages, exercise is an important part of being healthy. Exercise improves heart and lung function and helps to maintain strength, flexibility, and a healthy body weight. Exercise also boosts energy levels and elevates mood. For most women, maintaining an exercise routine throughout pregnancy is recommended. It is only on rare occasions and with certain medical conditions or pregnancy complications that women may be asked to limit or avoid exercise during pregnancy. What are some other benefits to exercising during pregnancy? Along with maintaining strength and flexibility, exercising throughout pregnancy can help to:  Keep strength in muscles that are very important during labor and childbirth.  Decrease low back pain during pregnancy.  Decrease the risk of developing gestational diabetes mellitus (GDM).  Improve blood sugar (glucose) control for women who have GDM.  Decrease the risk of developing preeclampsia. This is a serious condition that causes high blood pressure along with other symptoms, such as swelling and headaches.  Decrease the risk of cesarean delivery.  Speed up the recovery after giving birth.  How often should I exercise? Unless your health care provider gives you different instructions, you should try to exercise on most days or all days of the week. In general, try to exercise with moderate intensity for about 150 minutes per week. This can be spread out across several days, such as exercising for 30 minutes per day on 5 days of each week. You can tell that you are exercising at a moderate intensity if you have a higher heart rate and faster breathing, but you are still able to hold a conversation. What types of moderate-intensity exercise are recommended during pregnancy? There are many types of exercise that are safe for you to do during pregnancy. Unless your health care provider gives you different instructions, do a variety of  exercises that safely increase your heart and breathing (cardiopulmonary) rates and help you to build and maintain muscle strength (strength training). You should always be able to talk in full sentences while exercising during pregnancy. Some examples of exercising that is safe to do during pregnancy include:  Brisk walking or hiking.  Swimming.  Water aerobics.  Riding a stationary bike.  Strength training.  Modified yoga or Pilates. Tell your instructor that you are pregnant. Avoid overstretching and avoid lying on your back for long periods of time.  Running or jogging. Only choose this type of exercise if: ? You ran or jogged regularly before your pregnancy. ? You can run or jog and still talk in complete sentences.  What types of exercise should I not do during pregnancy? Depending on your level of fitness and whether you exercised regularly before your pregnancy, you may be advised to limit vigorous-intensity exercise during your pregnancy. You can tell that you are exercising at a vigorous intensity if you are breathing much harder and faster and cannot hold a conversation while exercising. Some examples of exercising that you should avoid during pregnancy include:  Contact sports.  Activities that place you at risk for falling on or being hit in the belly, such as downhill skiing, water skiing, surfing, rock climbing, cycling, gymnastics, and horseback riding.  Scuba diving.  Sky diving.  Yoga or Pilates in a room that is heated to extreme temperatures ("hot yoga" or "hot Pilates").  Jogging or running, unless you ran or jogged regularly before your pregnancy. While jogging or running, you should always be able to talk in full sentences. Do not run or jog so vigorously   that you are unable to have a conversation.  If you are not used to exercising at elevation (more than 6,000 feet above sea level), do not do so during your pregnancy.  When should I avoid exercising  during pregnancy? Certain medical conditions can make it unsafe to exercise during pregnancy, or they may increase your risk of miscarriage or early labor and birth. Some of these conditions include:  Some types of heart disease.  Some types of lung disease.  Placenta previa. This is when the placenta partially or completely covers the opening of the uterus (cervix).  Frequent bleeding from the vagina during your pregnancy.  Incompetent cervix. This is when your cervix does not remain as tightly closed during pregnancy as it should.  Premature labor.  Ruptured membranes. This is when the protective sac (amniotic sac) opens up and amniotic fluid leaks from your vagina.  Severely low blood count (anemia).  Preeclampsia or pregnancy-caused high blood pressure.  Carrying more than one baby (multiple gestation) and having an additional risk of early labor.  Poorly controlled diabetes.  Being severely underweight or severely overweight.  Intrauterine growth restriction. This is when your baby's growth and development during pregnancy are slower than expected.  Other medical conditions. Ask your health care provider if any apply to you.  What else should I know about exercising during pregnancy? You should take these precautions while exercising during pregnancy:  Avoid overheating. ? Wear loose-fitting, breathable clothes. ? Do not exercise in very high temperatures.  Avoid dehydration. Drink enough water before, during, and after exercise to keep your urine clear or pale yellow.  Avoid overstretching. Because of hormone changes during pregnancy, it is easy to overstretch muscles, tendons, and ligaments during pregnancy.  Start slowly and ask your health care provider to recommend types of exercise that are safe for you, if exercising regularly is new for you.  Pregnancy is not a time for exercising to lose weight. When should I seek medical care? You should stop exercising  and call your health care provider if you have any unusual symptoms, such as:  Mild uterine contractions or abdominal cramping.  Dizziness that does not improve with rest.  When should I seek immediate medical care? You should stop exercising and call your local emergency services (911 in the [place] if you have any unusual symptoms, such as:  Sudden, severe pain in your low back or your belly.  Uterine contractions or abdominal cramping that do not improve with rest.  Chest pain.  Bleeding or fluid leaking from your vagina.  Shortness of breath.  This information is not intended to replace advice given to you by your health care provider. Make sure you discuss any questions you have with your health care provider. Document Released: 06/07/2005 Document Revised: 11/05/2015 Document Reviewed: 08/15/2014 [employer] Interactive Patient Education  2018 [employer] Eating Plan for Pregnant Women While you are pregnant, your body will require additional nutrition to help support your growing baby. It is recommended that you consume:  150 additional calories each day during your first trimester.  300 additional calories each day during your second trimester.  300 additional calories each day during your third trimester.  Eating a healthy, well-balanced diet is very important for your health and for your baby's health. You also have a higher need for some vitamins and minerals, such as folic acid, calcium, iron, and vitamin D. What do I need to know about eating during pregnancy?  Do not try to lose weight   or go on a diet during pregnancy.  Choose healthy, nutritious foods. Choose  of a sandwich with a glass of milk instead of a candy bar or a high-calorie sugar-sweetened beverage.  Limit your overall intake of foods that have "empty calories." These are foods that have little nutritional value, such as sweets, desserts, candies, sugar-sweetened beverages, and fried foods.  Eat a  variety of foods, especially fruits and vegetables.  Take a prenatal vitamin to help meet the additional needs during pregnancy, specifically for folic acid, iron, calcium, and vitamin D.  Remember to stay active. Ask your health care provider for exercise recommendations that are specific to you.  Practice good food safety and cleanliness, such as washing your hands before you eat and after you prepare raw meat. This helps to prevent foodborne illnesses, such as listeriosis, that can be very dangerous for your baby. Ask your health care provider for more information about listeriosis. What does 150 extra calories look like? Healthy options for an additional 150 calories each day could be any of the following:  Plain low-fat yogurt (6-8 oz) with  cup of berries.  1 apple with 2 teaspoons of peanut butter.  Cut-up vegetables with  cup of hummus.  Low-fat chocolate milk (8 oz or 1 cup).  1 string cheese with 1 medium orange.   of a peanut butter and jelly sandwich on whole-wheat bread (1 tsp of peanut butter).  For 300 calories, you could eat two of those healthy options each day. What is a healthy amount of weight to gain? The recommended amount of weight for you to gain is based on your pre-pregnancy BMI. If your pre-pregnancy BMI was:  Less than 18 (underweight), you should gain 28-40 lb.  18-24.9 (normal), you should gain 25-35 lb.  25-29.9 (overweight), you should gain 15-25 lb.  Greater than 30 (obese), you should gain 11-20 lb.  What if I am having twins or multiples? Generally, pregnant women who will be having twins or multiples may need to increase their daily calories by 300-600 calories each day. The recommended range for total weight gain is 25-54 lb, depending on your pre-pregnancy BMI. Talk with your health care provider for specific guidance about additional nutritional needs, weight gain, and exercise during your pregnancy. What foods can I eat? Grains Any  grains. Try to choose whole grains, such as whole-wheat bread, oatmeal, or brown rice. Vegetables Any vegetables. Try to eat a variety of colors and types of vegetables to get a full range of vitamins and minerals. Remember to wash your vegetables well before eating. Fruits Any fruits. Try to eat a variety of colors and types of fruit to get a full range of vitamins and minerals. Remember to wash your fruits well before eating. Meats and Other Protein Sources Lean meats, including chicken, turkey, fish, and lean cuts of beef, veal, or pork. Make sure that all meats are cooked to "well done." Tofu. Tempeh. Beans. Eggs. Peanut butter and other nut butters. Seafood, such as shrimp, crab, and lobster. If you choose fish, select types that are higher in omega-3 fatty acids, including salmon, herring, mussels, trout, sardines, and pollock. Make sure that all meats are cooked to food-safe temperatures. Dairy Pasteurized milk and milk alternatives. Pasteurized yogurt and pasteurized cheese. Cottage cheese. Sour cream. Beverages Water. Juices that contain 100% fruit juice or vegetable juice. Caffeine-free teas and decaffeinated coffee. Drinks that contain caffeine are okay to drink, but it is better to avoid caffeine. Keep your total caffeine   intake to less than 200 mg each day (12 oz of coffee, tea, or soda) or as directed by your health care provider. Condiments Any pasteurized condiments. Sweets and Desserts Any sweets and desserts. Fats and Oils Any fats and oils. The items listed above may not be a complete list of recommended foods or beverages. Contact your dietitian for more options. What foods are not recommended? Vegetables Unpasteurized (raw) vegetable juices. Fruits Unpasteurized (raw) fruit juices. Meats and Other Protein Sources Cured meats that have nitrates, such as bacon, salami, and hotdogs. Luncheon meats, bologna, or other deli meats (unless they are reheated until they are  steaming hot). Refrigerated pate, meat spreads from a meat counter, smoked seafood that is found in the refrigerated section of a store. Raw fish, such as sushi or sashimi. High mercury content fish, such as tilefish, shark, swordfish, and king mackerel. Raw meats, such as tuna or beef tartare. Undercooked meats and poultry. Make sure that all meats are cooked to food-safe temperatures. Dairy Unpasteurized (raw) milk and any foods that have raw milk in them. Soft cheeses, such as feta, queso blanco, queso fresco, Brie, Camembert cheeses, blue-veined cheeses, and Panela cheese (unless it is made with pasteurized milk, which must be stated on the label). Beverages Alcohol. Sugar-sweetened beverages, such as sodas, teas, or energy drinks. Condiments Homemade fermented foods and drinks, such as pickles, sauerkraut, or kombucha drinks. (Store-bought pasteurized versions of these are okay.) Other Salads that are made in the store, such as ham salad, chicken salad, egg salad, tuna salad, and seafood salad. The items listed above may not be a complete list of foods and beverages to avoid. Contact your dietitian for more information. This information is not intended to replace advice given to you by your health care provider. Make sure you discuss any questions you have with your health care provider. Document Released: 03/22/2014 Document Revised: 11/13/2015 Document Reviewed: 11/20/2013 [employer] Interactive Patient Education  2018 [employer] Prenatal Care WHAT IS PRENATAL CARE? Prenatal care is the process of caring for a pregnant woman before she gives birth. Prenatal care makes sure that she and her baby remain as healthy as possible throughout pregnancy. Prenatal care may be provided by a midwife, family practice health care provider, or a childbirth and pregnancy specialist (obstetrician). Prenatal care may include physical examinations, testing, treatments, and education on nutrition, lifestyle, and  social support services. WHY IS PRENATAL CARE SO IMPORTANT? Early and consistent prenatal care increases the chance that you and your baby will remain healthy throughout your pregnancy. This type of care also decreases a baby's risk of being born too early (prematurely), or being born smaller than expected (small for gestational age). Any underlying medical conditions you may have that could pose a risk during your pregnancy are discussed during prenatal care visits. You will also be monitored regularly for any new conditions that may arise during your pregnancy so they can be treated quickly and effectively. WHAT HAPPENS DURING PRENATAL CARE VISITS? Prenatal care visits may include the following: Discussion Tell your health care provider about any new signs or symptoms you have experienced since your last visit. These might include:  Nausea or vomiting.  Increased or decreased level of energy.  Difficulty sleeping.  Back or leg pain.  Weight changes.  Frequent urination.  Shortness of breath with physical activity.  Changes in your skin, such as the development of a rash or itchiness.  Vaginal discharge or bleeding.  Feelings of excitement or nervousness.  Changes in   your baby's movements.  You may want to write down any questions or topics you want to discuss with your health care provider and bring them with you to your appointment. Examination During your first prenatal care visit, you will likely have a complete physical exam. Your health care provider will often examine your vagina, cervix, and the position of your uterus, as well as check your heart, lungs, and other body systems. As your pregnancy progresses, your health care provider will measure the size of your uterus and your baby's position inside your uterus. He or she may also examine you for early signs of labor. Your prenatal visits may also include checking your blood pressure and, after about 10-12 weeks of  pregnancy, listening to your baby's heartbeat. Testing Regular testing often includes:  Urinalysis. This checks your urine for glucose, protein, or signs of infection.  Blood count. This checks the levels of white and red blood cells in your body.  Tests for sexually transmitted infections (STIs). Testing for STIs at the beginning of pregnancy is routinely done and is required in many states.  Antibody testing. You will be checked to see if you are immune to certain illnesses, such as rubella, that can affect a developing fetus.  Glucose screen. Around 24-28 weeks of pregnancy, your blood glucose level will be checked for signs of gestational diabetes. Follow-up tests may be recommended.  Group B strep. This is a bacteria that is commonly found inside a woman's vagina. This test will inform your health care provider if you need an antibiotic to reduce the amount of this bacteria in your body prior to labor and childbirth.  Ultrasound. Many pregnant women undergo an ultrasound screening around 18-20 weeks of pregnancy to evaluate the health of the fetus and check for any developmental abnormalities.  HIV (human immunodeficiency virus) testing. Early in your pregnancy, you will be screened for HIV. If you are at high risk for HIV, this test may be repeated during your third trimester of pregnancy.  You may be offered other testing based on your age, personal or family medical history, or other factors. HOW OFTEN SHOULD I PLAN TO SEE MY HEALTH CARE PROVIDER FOR PRENATAL CARE? Your prenatal care check-up schedule depends on any medical conditions you have before, or develop during, your pregnancy. If you do not have any underlying medical conditions, you will likely be seen for checkups:  Monthly, during the first 6 months of pregnancy.  Twice a month during months 7 and 8 of pregnancy.  Weekly starting in the 9th month of pregnancy and until delivery.  If you develop signs of early labor  or other concerning signs or symptoms, you may need to see your health care provider more often. Ask your health care provider what prenatal care schedule is best for you. WHAT CAN I DO TO KEEP MYSELF AND MY BABY AS HEALTHY AS POSSIBLE DURING MY PREGNANCY?  Take a prenatal vitamin containing 400 micrograms (0.4 mg) of folic acid every day. Your health care provider may also ask you to take additional vitamins such as iodine, vitamin D, iron, copper, and zinc.  Take 1500-2000 mg of calcium daily starting at your 20th week of pregnancy until you deliver your baby.  Make sure you are up to date on your vaccinations. Unless directed otherwise by your health care provider: ? You should receive a tetanus, diphtheria, and pertussis (Tdap) vaccination between the 27th and 36th week of your pregnancy, regardless of when your last Tdap immunization   occurred. This helps protect your baby from whooping cough (pertussis) after he or she is born. ? You should receive an annual inactivated influenza vaccine (IIV) to help protect you and your baby from influenza. This can be done at any point during your pregnancy.  Eat a well-rounded diet that includes: ? Fresh fruits and vegetables. ? Lean proteins. ? Calcium-rich foods such as milk, yogurt, hard cheeses, and dark, leafy greens. ? Whole grain breads.  Do noteat seafood high in mercury, including: ? Swordfish. ? Tilefish. ? Shark. ? King mackerel. ? More than 6 oz tuna per week.  Do not eat: ? Raw or undercooked meats or eggs. ? Unpasteurized foods, such as soft cheeses (brie, blue, or feta), juices, and milks. ? Lunch meats. ? Hot dogs that have not been heated until they are steaming.  Drink enough water to keep your urine clear or pale yellow. For many women, this may be 10 or more 8 oz glasses of water each day. Keeping yourself hydrated helps deliver nutrients to your baby and may prevent the start of pre-term uterine contractions.  Do not use  any tobacco products including cigarettes, chewing tobacco, or electronic cigarettes. If you need help quitting, ask your health care provider.  Do not drink beverages containing alcohol. No safe level of alcohol consumption during pregnancy has been determined.  Do not use any illegal drugs. These can harm your developing baby or cause a miscarriage.  Ask your health care provider or pharmacist before taking any prescription or over-the-counter medicines, herbs, or supplements.  Limit your caffeine intake to no more than 200 mg per day.  Exercise. Unless told otherwise by your health care provider, try to get 30 minutes of moderate exercise most days of the week. Do not  do high-impact activities, contact sports, or activities with a high risk of falling, such as horseback riding or downhill skiing.  Get plenty of rest.  Avoid anything that raises your body temperature, such as hot tubs and saunas.  If you own a cat, do not empty its litter box. Bacteria contained in cat feces can cause an infection called toxoplasmosis. This can result in serious harm to the fetus.  Stay away from chemicals such as insecticides, lead, mercury, and cleaning or paint products that contain solvents.  Do not have any X-rays taken unless medically necessary.  Take a childbirth and breastfeeding preparation class. Ask your health care provider if you need a referral or recommendation.  This information is not intended to replace advice given to you by your health care provider. Make sure you discuss any questions you have with your health care provider. Document Released: 06/10/2003 Document Revised: 11/10/2015 Document Reviewed: 08/22/2013 [employer] Interactive Patient Education  2017 [employer]  

## 2017-11-18 ENCOUNTER — Encounter: Payer: Self-pay | Admitting: Advanced Practice Midwife

## 2017-11-18 LAB — URINE DRUG PANEL 7
Amphetamines, Urine: NEGATIVE ng/mL
Barbiturate Quant, Ur: NEGATIVE ng/mL
Benzodiazepine Quant, Ur: NEGATIVE ng/mL
Cannabinoid Quant, Ur: NEGATIVE ng/mL
Cocaine (Metab.): NEGATIVE ng/mL
Opiate Quant, Ur: NEGATIVE ng/mL
PCP Quant, Ur: NEGATIVE ng/mL

## 2017-11-19 DIAGNOSIS — O0990 Supervision of high risk pregnancy, unspecified, unspecified trimester: Secondary | ICD-10-CM | POA: Insufficient documentation

## 2017-11-19 DIAGNOSIS — O99210 Obesity complicating pregnancy, unspecified trimester: Secondary | ICD-10-CM | POA: Insufficient documentation

## 2017-11-19 DIAGNOSIS — Z6839 Body mass index (BMI) 39.0-39.9, adult: Secondary | ICD-10-CM | POA: Insufficient documentation

## 2017-11-19 LAB — CHLAMYDIA/GONOCOCCUS/TRICHOMONAS, NAA
Chlamydia by NAA: NEGATIVE
Gonococcus by NAA: NEGATIVE
Trich vag by NAA: NEGATIVE

## 2017-11-19 LAB — URINE CULTURE: Organism ID, Bacteria: NO GROWTH

## 2017-11-19 NOTE — Progress Notes (Signed)
New Obstetric Patient H&P    Chief Complaint: "Desires prenatal care"   History of Present Illness: Patient is a 32 y.o. G2P1001 Not Hispanic or Latino female, presents with amenorrhea and positive home pregnancy test. Patient's last menstrual period was 10/07/2017 (exact date). and based on her  LMP, her EDD is Estimated Date of Delivery: 07/14/18 and her EGA is 8332w1d. Cycles are 3. days, regular, and occur approximately every : 28 days. Her last pap smear was 5 months ago and was no abnormalities.    She had a urine pregnancy test which was positive 1 week(s)  ago. Her last menstrual period was normal and lasted for  3 day(s). Since her LMP she claims she has experienced breast tenderness, fatigue, nausea, vomiting. She denies vaginal bleeding. Her past medical history is noncontributory. Her prior pregnancies are notable for primary low transverse c/section for fetal intolerance  Since her LMP, she admits to the use of tobacco products  no She claims she has gained   2 pounds since the start of her pregnancy.  There are cats in the home in the home  no  She admits close contact with children on a regular basis  yes  She has had chicken pox in the past yes She has had Tuberculosis exposures, symptoms, or previously tested positive for TB   no Current or past history of domestic violence. no  Genetic Screening/Teratology Counseling: (Includes patient, baby's father, or anyone in either family with:)   1. [facility]  no 2. Thalassemia ([place], [place], Mediterranean, or Asian background): MCV<80  no 3. Neural tube defect (meningomyelocele, spina bifida, anencephaly)  no 4. Congenital heart defect  no  5. Down syndrome  no 6. Tay-Sachs (Jewish, [place])  no 7. Canavan's Disease  no 8. Sickle cell disease or trait (African)  no  9. Hemophilia or other blood disorders  no  10. Muscular dystrophy  no  11. Cystic fibrosis  no  12. Huntington's Chorea  no  13.  Mental retardation/autism  no 14. Other inherited genetic or chromosomal disorder  no 15. Maternal metabolic disorder (DM, PKU, etc)  no 16. Patient or FOB with a child with a birth defect not listed above no  16a. Patient or FOB with a birth defect themselves no 17. Recurrent pregnancy loss, or stillbirth  no  18. Any medications since LMP other than prenatal vitamins (include vitamins, supplements, OTC meds, drugs, alcohol)  no 19. Any other genetic/environmental exposure to discuss  no  Infection History:   1. Lives with someone with TB or TB exposed  no  2. Patient or partner has history of genital herpes  no 3. Rash or viral illness since LMP  no 4. History of STI (GC, CT, HPV, syphilis, HIV)  no 5. History of recent travel :  no  Other pertinent information:  no     Review of Systems:10 point review of systems negative unless otherwise noted in HPI  Past Medical History:  Past Medical History:  Diagnosis Date  . Asthma   . PCOS (polycystic ovarian syndrome)     Past Surgical History:  Past Surgical History:  Procedure Laterality Date  . CESAREAN SECTION  2013    Gynecologic History: Patient's last menstrual period was 10/07/2017 (exact date).  Obstetric History: G2P1001  Family History:  Family History  Problem Relation Age of Onset  . Breast cancer Paternal Grandmother [id number]    Social History:  Social History  Socioeconomic History  . Marital status: Married    Spouse name: Not on file  . Number of children: Not on file  . Years of education: Not on file  . Highest education level: Not on file  Occupational History  . Not on file  Social Needs  . Financial resource strain: Not on file  . Food insecurity:    Worry: Not on file    Inability: Not on file  . Transportation needs:    Medical: Not on file    Non-medical: Not on file  Tobacco Use  . Smoking status: Never Smoker  . Smokeless tobacco: Never Used  Substance and Sexual Activity  .  Alcohol use: Yes  . Drug use: No  . Sexual activity: Yes    Partners: Male    Birth control/protection: None  Lifestyle  . Physical activity:    Days per week: 3 days    Minutes per session: 30 min  . Stress: Rather much  Relationships  . Social connections:    Talks on phone: More than three times a week    Gets together: Never    Attends religious service: More than 4 times per year    Active member of club or organization: Yes    Attends meetings of clubs or organizations: More than 4 times per year    Relationship status: Married  . Intimate partner violence:    Fear of current or ex partner: No    Emotionally abused: No    Physically abused: No    Forced sexual activity: No  Other Topics Concern  . Not on file  Social History Narrative  . Not on file    Allergies:  Allergies  Allergen Reactions  . Penicillins     Medications: Prior to Admission medications   Medication Sig Start Date End Date Taking? Authorizing Provider  albuterol (PROVENTIL HFA;VENTOLIN HFA) 108 (90 Base) MCG/ACT inhaler Inhale 2 puffs into the lungs every 6 (six) hours as needed for wheezing or shortness of breath.   Yes [provider]    Physical Exam Vitals: Blood pressure 126/82, weight 222 lb (100.7 kg), last menstrual period 10/07/2017.  General: NAD HEENT: normocephalic, anicteric Thyroid: no enlargement, no palpable nodules Pulmonary: No increased work of breathing, CTAB Cardiovascular: RRR, distal pulses 2+ Abdomen: NABS, soft, non-tender, non-distended.  Umbilicus without lesions.  No hepatomegaly, splenomegaly or masses palpable. No evidence of hernia  Genitourinary:  External: Normal external female genitalia.  Normal urethral meatus, normal  Bartholin's and Skene's glands.    Vagina: Normal vaginal mucosa, no evidence of prolapse.    Cervix: Grossly normal in appearance, no bleeding, no CMT  Uterus: deferred for no concerns/shared decision making  Adnexa: deferred  for no concerns/shared decision making  Rectal: deferred Extremities: no edema, erythema, or tenderness Neurologic: Grossly intact Psychiatric: mood appropriate, affect full   Assessment: 32 y.o. G2P1001 at [redacted]w[redacted]d presenting to initiate prenatal care  Plan: 1) Avoid alcoholic beverages. 2) Patient encouraged not to smoke.  3) Discontinue the use of all non-medicinal drugs and chemicals.  4) Take prenatal vitamins daily.  5) Nutrition, food safety (fish, cheese advisories, and high nitrite foods) and exercise discussed. 6) Hospital and practice style discussed with cross coverage system.  7) Genetic Screening, such as with 1st Trimester Screening, cell free fetal DNA, AFP testing, and Ultrasound, as well as with amniocentesis and CVS as appropriate, is discussed with patient. At the conclusion of today's visit patient requested cell free DNA genetic  testing 8) Patient is asked about travel to areas at risk for the Zika virus, and counseled to avoid travel and exposure to mosquitoes or sexual partners who may have themselves been exposed to the virus. Testing is discussed, and will be ordered as appropriate.    Tresea Mall, CNM [facility], [facility] 11/19/2017, 6:52 AM

## 2017-11-28 ENCOUNTER — Ambulatory Visit (INDEPENDENT_AMBULATORY_CARE_PROVIDER_SITE_OTHER): Payer: Self-pay

## 2017-11-28 ENCOUNTER — Ambulatory Visit (INDEPENDENT_AMBULATORY_CARE_PROVIDER_SITE_OTHER): Payer: Self-pay | Admitting: Obstetrics and Gynecology

## 2017-11-28 ENCOUNTER — Other Ambulatory Visit: Payer: Self-pay | Admitting: Advanced Practice Midwife

## 2017-11-28 VITALS — BP 102/62 | Wt 222.0 lb

## 2017-11-28 DIAGNOSIS — O99210 Obesity complicating pregnancy, unspecified trimester: Secondary | ICD-10-CM

## 2017-11-28 DIAGNOSIS — O99211 Obesity complicating pregnancy, first trimester: Secondary | ICD-10-CM

## 2017-11-28 DIAGNOSIS — O0990 Supervision of high risk pregnancy, unspecified, unspecified trimester: Secondary | ICD-10-CM

## 2017-11-28 DIAGNOSIS — Z6839 Body mass index (BMI) 39.0-39.9, adult: Secondary | ICD-10-CM

## 2017-11-28 DIAGNOSIS — O0991 Supervision of high risk pregnancy, unspecified, first trimester: Secondary | ICD-10-CM

## 2017-11-28 DIAGNOSIS — Z3A01 Less than 8 weeks gestation of pregnancy: Secondary | ICD-10-CM

## 2017-11-28 NOTE — Progress Notes (Signed)
Dating Scan today. No c/o's. 

## 2017-11-29 ENCOUNTER — Encounter: Payer: Self-pay | Admitting: Obstetrics and Gynecology

## 2017-11-29 NOTE — Progress Notes (Signed)
    Routine Prenatal Care Visit  Subjective  Shelby Aguilar is a 32 y.o. G2P1001 at 1752w6d being seen today for ongoing prenatal care.  She is currently monitored for the following issues for this high-risk pregnancy and has GOITER, UNSPECIFIED; PCOD (polycystic ovarian disease); Obesity affecting pregnancy, antepartum; BMI 39.0-39.9,adult; and Supervision of high risk pregnancy, antepartum on their problem list.  ----------------------------------------------------------------------------------- Patient reports no complaints.   Contractions: Not present. Vag. Bleeding: None.   . Denies leaking of fluid.  ----------------------------------------------------------------------------------- The following portions of the patient's history were reviewed and updated as appropriate: allergies, current medications, past family history, past medical history, past social history, past surgical history and problem list. Problem list updated.   Objective  Blood pressure 102/62, weight 222 lb (100.7 kg), last menstrual period 10/07/2017. Pregravid weight 220 lb (99.8 kg) Total Weight Gain 2 lb (0.907 kg) Urinalysis: Urine Protein: Negative Urine Glucose: Negative  Fetal Status: Fetal Heart Rate (bpm): 125         General:  Alert, oriented and cooperative. Patient is in no acute distress.  Skin: Skin is warm and dry. No rash noted.   Cardiovascular: Normal heart rate noted  Respiratory: Normal respiratory effort, no problems with respiration noted  Abdomen: Soft, gravid, appropriate for gestational age. Pain/Pressure: Absent     Pelvic:  Cervical exam deferred        Extremities: Normal range of motion.     ental Status: Normal mood and affect. Normal behavior. Normal judgment and thought content.     Assessment   32 y.o. G2P1001 at 4752w6d by  07/19/2018, by Ultrasound presenting for routine prenatal visit  Plan   Pregnancy#2 Problems (from 10/07/17 to present)    Problem Noted Resolved   Obesity affecting pregnancy, antepartum 11/19/2017 by Tresea MallGledhill, Jane, CNM No   BMI 39.0-39.9,adult 11/19/2017 by Tresea MallGledhill, Jane, CNM No       Gestational age appropriate obstetric precautions including but not limited to vaginal bleeding, contractions, leaking of fluid and fetal movement were reviewed in detail with the patient.    Due date updated based on ultrasound. Labs at next visit once insurance is active.  Return in about 1 month (around 12/26/2017) for ROB.  Adelene Idlerhristanna Fin Hupp MD [facility], [facility] 11/29/17 8:10 AM

## 2018-01-02 ENCOUNTER — Ambulatory Visit (INDEPENDENT_AMBULATORY_CARE_PROVIDER_SITE_OTHER): Payer: Managed Care, Other (non HMO) | Admitting: Obstetrics and Gynecology

## 2018-01-02 ENCOUNTER — Encounter: Payer: Self-pay | Admitting: Obstetrics and Gynecology

## 2018-01-02 VITALS — BP 120/62 | Wt 224.0 lb

## 2018-01-02 DIAGNOSIS — Z6839 Body mass index (BMI) 39.0-39.9, adult: Secondary | ICD-10-CM

## 2018-01-02 DIAGNOSIS — Z1379 Encounter for other screening for genetic and chromosomal anomalies: Secondary | ICD-10-CM

## 2018-01-02 DIAGNOSIS — O99210 Obesity complicating pregnancy, unspecified trimester: Secondary | ICD-10-CM

## 2018-01-02 DIAGNOSIS — Z3A11 11 weeks gestation of pregnancy: Secondary | ICD-10-CM

## 2018-01-02 DIAGNOSIS — O0990 Supervision of high risk pregnancy, unspecified, unspecified trimester: Secondary | ICD-10-CM

## 2018-01-02 NOTE — Progress Notes (Signed)
    Routine Prenatal Care Visit  Subjective  Shelby Aguilar is a 32 y.o. G2P1001 at 3918w5d being seen today for ongoing prenatal care.  She is currently monitored for the following issues for this high-risk pregnancy and has GOITER, UNSPECIFIED; PCOD (polycystic ovarian disease); Obesity affecting pregnancy, antepartum; BMI 39.0-39.9,adult; and Supervision of high risk pregnancy, antepartum on their problem list.  ----------------------------------------------------------------------------------- Patient reports no complaints.   Contractions: Not present. Vag. Bleeding: None.   . Denies leaking of fluid.  ----------------------------------------------------------------------------------- The following portions of the patient's history were reviewed and updated as appropriate: allergies, current medications, past family history, past medical history, past social history, past surgical history and problem list. Problem list updated.   Objective  Blood pressure 120/62, weight 224 lb (101.6 kg), last menstrual period 10/07/2017. Pregravid weight 220 lb (99.8 kg) Total Weight Gain 4 lb (1.814 kg) Urinalysis: Urine Protein: Negative Urine Glucose: Negative  Fetal Status: Fetal Heart Rate (bpm): 162         General:  Alert, oriented and cooperative. Patient is in no acute distress.  Skin: Skin is warm and dry. No rash noted.   Cardiovascular: Normal heart rate noted  Respiratory: Normal respiratory effort, no problems with respiration noted  Abdomen: Soft, gravid, appropriate for gestational age. Pain/Pressure: Absent     Pelvic:  Cervical exam deferred        Extremities: Normal range of motion.     ental Status: Normal mood and affect. Normal behavior. Normal judgment and thought content.     Assessment   32 y.o. G2P1001 at 418w5d by  07/19/2018, by Ultrasound presenting for routine prenatal visit  Plan   Pregnancy#2 Problems (from 10/07/17 to present)    Problem Noted Resolved   Obesity affecting pregnancy, antepartum 11/19/2017 by Tresea MallGledhill, Jane, CNM No   BMI 39.0-39.9,adult 11/19/2017 by Tresea MallGledhill, Jane, CNM No       Gestational age appropriate obstetric precautions including but not limited to vaginal bleeding, contractions, leaking of fluid and fetal movement were reviewed in detail with the patient.    NOB labs today.  Materniti 21 testing today.  Return in about 1 month (around 01/30/2018) for ROB.  Adelene Idlerhristanna Jarae Panas MD [facility], [facility] 01/02/18 2:23 PM

## 2018-01-02 NOTE — Progress Notes (Signed)
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 Having some mild cramping today.

## 2018-01-03 LAB — RPR+RH+ABO+RUB AB+AB SCR+CB...
Antibody Screen: NEGATIVE
HIV Screen 4th Generation wRfx: NONREACTIVE
Hematocrit: 34.1 % (ref 34.0–46.6)
Hemoglobin: 11.3 g/dL (ref 11.1–15.9)
Hepatitis B Surface Ag: NEGATIVE
MCH: 26.7 pg (ref 26.6–33.0)
MCHC: 33.1 g/dL (ref 31.5–35.7)
MCV: 81 fL (ref 79–97)
Platelets: 272 10*3/uL (ref 150–450)
RBC: 4.23 x10E6/uL (ref 3.77–5.28)
RDW: 12.7 % (ref 12.3–15.4)
RPR Ser Ql: NONREACTIVE
Rh Factor: POSITIVE
Rubella Antibodies, IGG: 5.43 index (ref >0.99)
Varicella zoster IgG: 1423 index (ref >165)
WBC: 10.8 10*3/uL (ref 3.4–10.8)

## 2018-01-07 LAB — MATERNIT 21 PLUS CORE, BLOOD
Chromosome 13: NEGATIVE
Chromosome 18: NEGATIVE
Chromosome 21: NEGATIVE
Y Chromosome: DETECTED

## 2018-01-09 NOTE — Progress Notes (Signed)
Called pt and she is aware of envelope at front desk

## 2018-01-09 NOTE — Progress Notes (Signed)
This patient would like an envelope with the baby's gender (female). Could you please prepare one and call the patient so she can pick it up? Thank you, Dr. Jerene PitchSchuman

## 2018-01-09 NOTE — Progress Notes (Signed)
Normal XY. Released to mychart.

## 2018-01-30 ENCOUNTER — Ambulatory Visit (INDEPENDENT_AMBULATORY_CARE_PROVIDER_SITE_OTHER): Payer: Managed Care, Other (non HMO) | Admitting: Maternal Newborn

## 2018-01-30 VITALS — BP 114/64 | Wt 224.0 lb

## 2018-01-30 DIAGNOSIS — Z3A15 15 weeks gestation of pregnancy: Secondary | ICD-10-CM

## 2018-01-30 DIAGNOSIS — Z3689 Encounter for other specified antenatal screening: Secondary | ICD-10-CM

## 2018-01-30 DIAGNOSIS — O0990 Supervision of high risk pregnancy, unspecified, unspecified trimester: Secondary | ICD-10-CM

## 2018-01-30 LAB — POCT URINALYSIS DIPSTICK OB: Glucose, UA: NEGATIVE — AB

## 2018-01-30 MED ORDER — CITRANATAL BLOOM 90-1 MG PO TABS: 1.0000 | ORAL_TABLET | Freq: Every day | ORAL | 6 refills | Status: DC

## 2018-01-30 NOTE — Patient Instructions (Signed)

## 2018-01-30 NOTE — Progress Notes (Signed)
Routine Prenatal Care Visit  Subjective  Shelby Aguilar is a 32 y.o. G2P1001 at 8767w5d being seen today for ongoing prenatal care.  She is currently monitored for the following issues for this high-risk pregnancy and has GOITER, UNSPECIFIED; PCOD (polycystic ovarian disease); Obesity affecting pregnancy, antepartum; BMI 39.0-39.9,adult; and Supervision of high risk pregnancy, antepartum on their problem list.  ----------------------------------------------------------------------------------- Patient reports some pain in her side occasionally, she thinks she may have pulled a muscle.   Contractions: Not present. Vag. Bleeding: None.  ----------------------------------------------------------------------------------- The following portions of the patient's history were reviewed and updated as appropriate: allergies, current medications, past family history, past medical history, past social history, past surgical history and problem list. Problem list updated.   Objective  Blood pressure 114/64, weight 224 lb (101.6 kg), last menstrual period 10/07/2017. Pregravid weight 220 lb (99.8 kg) Total Weight Gain 4 lb (1.814 kg) Body mass index is 39.68 kg/m. Urinalysis: Protein Trace; Glucose Negative Fetal Status: Fetal Heart Rate (bpm): 153         General:  Alert, oriented and cooperative. Patient is in no acute distress.  Skin: Skin is warm and dry. No rash noted.   Cardiovascular: Normal heart rate noted  Respiratory: Normal respiratory effort, no problems with respiration noted  Abdomen: Soft, gravid, appropriate for gestational age. Pain/Pressure: Absent     Pelvic:  Cervical exam deferred        Extremities: Normal range of motion.     Mental Status: Normal mood and affect. Normal behavior. Normal judgment and thought content.     Assessment   32 y.o. G2P1001 at 7667w5d, EDD 07/19/2018 by Ultrasound presenting for routine prenatal visit.   Plan   Pregnancy#2 Problems (from  10/07/17 to present)    Problem Noted Resolved   Obesity affecting pregnancy, antepartum 11/19/2017 by Tresea MallGledhill, Jane, CNM No   BMI 39.0-39.9,adult 11/19/2017 by Tresea MallGledhill, Jane, CNM No   Supervision of high risk pregnancy, antepartum 11/19/2017 by Tresea MallGledhill, Jane, CNM No   Overview Addendum 01/30/2018  8:16 AM by Oswaldo ConroySchmid, Saleena Tamas Y, CNM    Clinic [facility]  Dating 6 wk US Blood type: A/Positive/-- (07/15 1428)   Genetic Screen 1 Screen:    AFP:     Quad:     NIPS: Antibody:Negative (07/15 1428)  Anatomic US  Rubella: 5.43 (07/15 1428) Varicella: Immune  GTT Early:                Third trimester:  RPR: Non Reactive (07/15 1428)   Rhogam  HBsAg: Negative (07/15 1428)   TDaP vaccine                        Flu Shot: HIV: Non Reactive (07/15 1428)   Baby Food Breast                              GBS:   Contraception  Pap: 06/17/17, NILM  CBB     CS/VBAC 2013 LTCS   Support Person Husband Montegus            Rx sent for prenatal vitamins.  Gestational age appropriate obstetric precautions including but not limited to vaginal bleeding, contractions, leaking of fluid and fetal movement were reviewed.  Please refer to After Visit Summary for other counseling recommendations.   Return in about 4 weeks (around 02/27/2018) for ROB and anatomy scan.  Marcelyn BruinsJacelyn Moritz Lever, CNM 01/30/2018  8:30 AM

## 2018-02-27 ENCOUNTER — Ambulatory Visit (INDEPENDENT_AMBULATORY_CARE_PROVIDER_SITE_OTHER): Payer: Managed Care, Other (non HMO)

## 2018-02-27 ENCOUNTER — Ambulatory Visit (INDEPENDENT_AMBULATORY_CARE_PROVIDER_SITE_OTHER): Payer: Managed Care, Other (non HMO) | Admitting: Obstetrics and Gynecology

## 2018-02-27 ENCOUNTER — Encounter: Payer: Self-pay | Admitting: Obstetrics and Gynecology

## 2018-02-27 VITALS — BP 118/62 | Wt 224.0 lb

## 2018-02-27 DIAGNOSIS — Z363 Encounter for antenatal screening for malformations: Secondary | ICD-10-CM | POA: Diagnosis not present

## 2018-02-27 DIAGNOSIS — O0990 Supervision of high risk pregnancy, unspecified, unspecified trimester: Secondary | ICD-10-CM

## 2018-02-27 DIAGNOSIS — O0992 Supervision of high risk pregnancy, unspecified, second trimester: Secondary | ICD-10-CM

## 2018-02-27 DIAGNOSIS — Z23 Encounter for immunization: Secondary | ICD-10-CM | POA: Diagnosis not present

## 2018-02-27 DIAGNOSIS — Z3689 Encounter for other specified antenatal screening: Secondary | ICD-10-CM

## 2018-02-27 DIAGNOSIS — Z3A19 19 weeks gestation of pregnancy: Secondary | ICD-10-CM

## 2018-02-27 LAB — POCT URINALYSIS DIPSTICK OB
Glucose, UA: NEGATIVE
POC,PROTEIN,UA: NEGATIVE

## 2018-02-27 NOTE — Progress Notes (Signed)
Routine Prenatal Care Visit  Subjective  Shelby Aguilar is a 32 y.o. G2P1001 at [redacted]w[redacted]d being seen today for ongoing prenatal care.  She is currently monitored for the following issues for this high-risk pregnancy and has GOITER, UNSPECIFIED; PCOD (polycystic ovarian disease); Obesity affecting pregnancy, antepartum; BMI 39.0-39.9,adult; and Supervision of high risk pregnancy, antepartum on their problem list.  ----------------------------------------------------------------------------------- Patient reports occassional sharp severe pain along the site of her cesarean scar. .   Contractions: Not present. Vag. Bleeding: None.   . Denies leaking of fluid.  ----------------------------------------------------------------------------------- The following portions of the patient's history were reviewed and updated as appropriate: allergies, current medications, past family history, past medical history, past social history, past surgical history and problem list. Problem list updated.   Objective  Blood pressure 118/62, weight 224 lb (101.6 kg), last menstrual period 10/07/2017. Pregravid weight 220 lb (99.8 kg) Total Weight Gain 4 lb (1.814 kg) Urinalysis:      Fetal Status: Fetal Heart Rate (bpm): 155         General:  Alert, oriented and cooperative. Patient is in no acute distress.  Skin: Skin is warm and dry. No rash noted.   Cardiovascular: Normal heart rate noted  Respiratory: Normal respiratory effort, no problems with respiration noted  Abdomen: Soft, gravid, appropriate for gestational age. Pain/Pressure: Present     Pelvic:  Cervical exam deferred        Extremities: Normal range of motion.     ental Status: Normal mood and affect. Normal behavior. Normal judgment and thought content.     Assessment   32 y.o. G2P1001 at [redacted]w[redacted]d by  07/19/2018, by Ultrasound presenting for routine prenatal visit  Plan   Pregnancy#2 Problems (from 10/07/17 to present)    Problem Noted  Resolved   Obesity affecting pregnancy, antepartum 11/19/2017 by Tresea Mall, CNM No   BMI 39.0-39.9,adult 11/19/2017 by Tresea Mall, CNM No   Supervision of high risk pregnancy, antepartum 11/19/2017 by Tresea Mall, CNM No   Overview Addendum 02/27/2018  9:19 AM by Natale Milch, MD    [facility]inic [facility] P[facility]atal [facility]  Dating 6 wk [place] Blood type: A/Positive/-- (07/15 1428)   Genetic Screen  NIPS: normal XY Antibody:Negative (07/15 1428)  Anatomic [place]  incomplete Rubella: 5.43 (07/15 1428) Varicella: Immune  GTT Early:                Third trimester:  RPR: Non Reactive (07/15 1428)   Rhogam  not needed HBsAg: Negative (07/15 1428)   TDaP vaccine                        Flu Shot: 02/27/18 HIV: Non Reactive (07/15 1428)   Baby Food Breast                              GBS:   Contraception  Pap: 06/17/17, NILM  CBB     CS/VBAC 2013 LTCS   Support Person Husband Montegus               Gestational age appropriate obstetric precautions including but not limited to vaginal bleeding, contractions, leaking of fluid and fetal movement were reviewed in detail with the patient.    Flu vaccine today, [place] incomplete.  Discussed her concerns about scar tissue and uterine separation.  Return in about 2 weeks (around 03/13/2018) for ROB and [place].  Adelene Idler MD [facility], [facility] 02/27/18 2:29 PM

## 2018-02-27 NOTE — Progress Notes (Signed)
Rob/Anatomy scan  Flu shot today C/o Pains with coughing/sneezing/and when walking feels like stabbing pains in LQ

## 2018-03-15 ENCOUNTER — Ambulatory Visit (INDEPENDENT_AMBULATORY_CARE_PROVIDER_SITE_OTHER): Payer: Managed Care, Other (non HMO) | Admitting: Obstetrics and Gynecology

## 2018-03-15 ENCOUNTER — Other Ambulatory Visit: Payer: Self-pay | Admitting: Obstetrics and Gynecology

## 2018-03-15 ENCOUNTER — Ambulatory Visit (INDEPENDENT_AMBULATORY_CARE_PROVIDER_SITE_OTHER): Payer: Managed Care, Other (non HMO)

## 2018-03-15 ENCOUNTER — Encounter: Payer: Self-pay | Admitting: Obstetrics and Gynecology

## 2018-03-15 VITALS — BP 120/74 | Wt 226.0 lb

## 2018-03-15 DIAGNOSIS — O0990 Supervision of high risk pregnancy, unspecified, unspecified trimester: Secondary | ICD-10-CM

## 2018-03-15 DIAGNOSIS — O99210 Obesity complicating pregnancy, unspecified trimester: Secondary | ICD-10-CM

## 2018-03-15 DIAGNOSIS — O99212 Obesity complicating pregnancy, second trimester: Secondary | ICD-10-CM

## 2018-03-15 DIAGNOSIS — Z131 Encounter for screening for diabetes mellitus: Secondary | ICD-10-CM

## 2018-03-15 DIAGNOSIS — Z113 Encounter for screening for infections with a predominantly sexual mode of transmission: Secondary | ICD-10-CM

## 2018-03-15 DIAGNOSIS — Z362 Encounter for other antenatal screening follow-up: Secondary | ICD-10-CM

## 2018-03-15 DIAGNOSIS — Z6839 Body mass index (BMI) 39.0-39.9, adult: Secondary | ICD-10-CM

## 2018-03-15 DIAGNOSIS — Z3A22 22 weeks gestation of pregnancy: Secondary | ICD-10-CM

## 2018-03-15 NOTE — Patient Instructions (Signed)
Second Trimester of Pregnancy The second trimester is from week 13 through week 28, month 4 through 6. This is often the time in pregnancy that you feel your best. Often times, morning sickness has lessened or quit. You may have more energy, and you may get hungry more often. Your unborn baby (fetus) is growing rapidly. At the end of the sixth month, he or she is about 9 inches long and weighs about 1 pounds. You will likely feel the baby move (quickening) between 18 and 20 weeks of pregnancy. Follow these instructions at home:  Avoid all smoking, herbs, and alcohol. Avoid drugs not approved by your doctor.  Do not use any tobacco products, including cigarettes, chewing tobacco, and electronic cigarettes. If you need help quitting, ask your doctor. You may get counseling or other support to help you quit.  Only take medicine as told by your doctor. Some medicines are safe and some are not during pregnancy.  Exercise only as told by your doctor. Stop exercising if you start having cramps.  Eat regular, healthy meals.  Wear a good support bra if your breasts are tender.  Do not use hot tubs, steam rooms, or saunas.  Wear your seat belt when driving.  Avoid raw meat, uncooked cheese, and liter boxes and soil used by cats.  Take your prenatal vitamins.  Take 1500-2000 milligrams of calcium daily starting at the 20th week of pregnancy until you deliver your baby.  Try taking medicine that helps you poop (stool softener) as needed, and if your doctor approves. Eat more fiber by eating fresh fruit, vegetables, and whole grains. Drink enough fluids to keep your pee (urine) clear or pale yellow.  Take warm water baths (sitz baths) to soothe pain or discomfort caused by hemorrhoids. Use hemorrhoid cream if your doctor approves.  If you have puffy, bulging veins (varicose veins), wear support hose. Raise (elevate) your feet for 15 minutes, 3-4 times a day. Limit salt in your diet.  Avoid heavy  lifting, wear low heals, and sit up straight.  Rest with your legs raised if you have leg cramps or low back pain.  Visit your dentist if you have not gone during your pregnancy. Use a soft toothbrush to brush your teeth. Be gentle when you floss.  You can have sex (intercourse) unless your doctor tells you not to.  Go to your doctor visits. Get help if:  You feel dizzy.  You have mild cramps or pressure in your lower belly (abdomen).  You have a nagging pain in your belly area.  You continue to feel sick to your stomach (nauseous), throw up (vomit), or have watery poop (diarrhea).  You have bad smelling fluid coming from your vagina.  You have pain with peeing (urination). Get help right away if:  You have a fever.  You are leaking fluid from your vagina.  You have spotting or bleeding from your vagina.  You have severe belly cramping or pain.  You lose or gain weight rapidly.  You have trouble catching your breath and have chest pain.  You notice sudden or extreme puffiness (swelling) of your face, hands, ankles, feet, or legs.  You have not felt the baby move in over an hour.  You have severe headaches that do not go away with medicine.  You have vision changes. This information is not intended to replace advice given to you by your health care provider. Make sure you discuss any questions you have with your health care   provider. Document Released: 09/01/2009 Document Revised: 11/13/2015 Document Reviewed: 08/08/2012 Elsevier Interactive Patient Education  2017 [employer]  

## 2018-03-15 NOTE — Progress Notes (Signed)
Routine Prenatal Care Visit  Subjective  Shelby Aguilar is a 32 y.o. G2P1001 at [redacted]w[redacted]d being seen today for ongoing prenatal care.  She is currently monitored for the following issues for this high-risk pregnancy and has GOITER, UNSPECIFIED; PCOD (polycystic ovarian disease); Obesity affecting pregnancy, antepartum; BMI 39.0-39.9,adult; and Supervision of high risk pregnancy, antepartum on their problem list.  ----------------------------------------------------------------------------------- Patient reports no complaints.   Contractions: Not present. Vag. Bleeding: None.  Movement: Present. Denies leaking of fluid.  Anatomy U/S complete today. ----------------------------------------------------------------------------------- The following portions of the patient's history were reviewed and updated as appropriate: allergies, current medications, past family history, past medical history, past social history, past surgical history and problem list. Problem list updated.   Objective  Blood pressure 120/74, weight 226 lb (102.5 kg), last menstrual period 10/07/2017. Pregravid weight 220 lb (99.8 kg) Total Weight Gain 6 lb (2.722 kg) Urinalysis: Urine Protein    Urine Glucose    Fetal Status: Fetal Heart Rate (bpm): present   Movement: Present     General:  Alert, oriented and cooperative. Patient is in no acute distress.  Skin: Skin is warm and dry. No rash noted.   Cardiovascular: Normal heart rate noted  Respiratory: Normal respiratory effort, no problems with respiration noted  Abdomen: Soft, gravid, appropriate for gestational age. Pain/Pressure: Absent     Pelvic:  Cervical exam deferred        Extremities: Normal range of motion.  Edema: None  Mental Status: Normal mood and affect. Normal behavior. Normal judgment and thought content.   US Ob Follow Up  Result Date: 03/15/2018 Patient Name: Shelby Aguilar DOB: Nov 08, 1985 MRN: [id number] ULTRASOUND REPORT Location: [facility] Date of Service: 03/15/2018 Indications: Anatomy follow up ultrasound Findings: Mason Jim intrauterine pregnancy is visualized with FHR at 127 BPM. Fetal presentation is Cephalic. Placenta: posterior left. Grade: 0 AFI: subjectively normal. Anatomic survey is now complete for cerebellum and cisterna magna.  There is no free peritoneal fluid in the cul de sac. Impression: 1. [redacted]w[redacted]d Viable Singleton Intrauterine pregnancy previously established criteria. 2. Normal Anatomy Scan is now complete  for cerebellum and cisterna magna.  Deirdre Peer, RDMS RVT There is a singleton gestation with subjectively normal amniotic fluid volume. The fetal biometry correlates with established dating. Detailed evaluation of the fetal anatomy was performed. The fetal anatomical survey appears within normal limits within the resolution of ultrasound as described above.  Not all structures were attempted to be visualized as they were noted on a previous exam. See the prior exam for full details.  It must be noted that a normal ultrasound is unable to rule out fetal aneuploidy nor is it able to detect all possible malformations. The ultrasound images and findings were reviewed by me and I agree with the above report. Thomasene Mohair, MD, [facility], [facility] 03/15/2018 8:40 AM      Assessment   32 y.o. G2P1001 at [redacted]w[redacted]d by  07/19/2018, by Ultrasound presenting for routine prenatal visit  Plan   Pregnancy#2 Problems (from 10/07/17 to present)    Problem Noted Resolved   Obesity affecting pregnancy, antepartum 11/19/2017 by Tresea Mall, CNM No   BMI 39.0-39.9,adult 11/19/2017 by Tresea Mall, CNM No   Supervision of high risk pregnancy, antepartum 11/19/2017 by Tresea Mall, CNM No   Overview Addendum 02/27/2018  2:29 PM by Natale Milch, MD    Clinic [facility] Prenatal Labs  Dating 6 wk [place] Blood type: A/Positive/-- (07/15 1428)   Genetic  Screen  NIPS: normal XY Antibody:Negative (07/15 1428)   Anatomic [place]  incomplete Rubella: 5.43 (07/15 1428) Varicella: Immune  GTT Early:                Third trimester:  RPR: Non Reactive (07/15 1428)   Rhogam  not needed HBsAg: Negative (07/15 1428)   TDaP vaccine                        Flu Shot: 02/27/18 HIV: Non Reactive (07/15 1428)   Baby Food Breast                              GBS:   Contraception  Pap: 06/17/17, NILM  CBB     CS/VBAC 2013 LTCS:  Desires VBAC   Support Person Husband [place]             Preterm labor symptoms and general obstetric precautions including but not limited to vaginal bleeding, contractions, leaking of fluid and fetal movement were reviewed in detail with the patient. Please refer to After Visit Summary for other counseling recommendations.   Return in about 4 weeks (around 04/12/2018) for schedule 28 week labs and routine prenatal.  Thomasene Mohair, MD, [facility], [facility] 03/15/2018 9:09 AM

## 2018-04-12 ENCOUNTER — Other Ambulatory Visit: Payer: Managed Care, Other (non HMO)

## 2018-04-12 ENCOUNTER — Encounter: Payer: Self-pay | Admitting: Obstetrics & Gynecology

## 2018-04-12 ENCOUNTER — Ambulatory Visit (INDEPENDENT_AMBULATORY_CARE_PROVIDER_SITE_OTHER): Payer: Managed Care, Other (non HMO) | Admitting: Obstetrics & Gynecology

## 2018-04-12 VITALS — BP 122/70 | Wt 229.0 lb

## 2018-04-12 DIAGNOSIS — Z131 Encounter for screening for diabetes mellitus: Secondary | ICD-10-CM

## 2018-04-12 DIAGNOSIS — O99210 Obesity complicating pregnancy, unspecified trimester: Secondary | ICD-10-CM

## 2018-04-12 DIAGNOSIS — O0990 Supervision of high risk pregnancy, unspecified, unspecified trimester: Secondary | ICD-10-CM

## 2018-04-12 DIAGNOSIS — Z113 Encounter for screening for infections with a predominantly sexual mode of transmission: Secondary | ICD-10-CM

## 2018-04-12 DIAGNOSIS — Z3A26 26 weeks gestation of pregnancy: Secondary | ICD-10-CM

## 2018-04-12 DIAGNOSIS — O99212 Obesity complicating pregnancy, second trimester: Secondary | ICD-10-CM

## 2018-04-12 LAB — POCT URINALYSIS DIPSTICK OB
Bilirubin, UA: NEGATIVE
Blood, UA: NEGATIVE
Glucose, UA: NEGATIVE
Ketones, UA: NEGATIVE
Leukocytes, UA: NEGATIVE
Nitrite, UA: NEGATIVE
POC,PROTEIN,UA: NEGATIVE
Spec Grav, UA: 1.01 (ref 1.010–1.025)
Urobilinogen, UA: 0.2 E.U./dL
pH, UA: 5 (ref 5.0–8.0)

## 2018-04-12 NOTE — Patient Instructions (Signed)
Third Trimester of Pregnancy The third trimester is from week 28 through week 40 (months 7 through 9). The third trimester is a time when the unborn baby (fetus) is growing rapidly. At the end of the ninth month, the fetus is about 20 inches in length and weighs 6-10 pounds. Body changes during your third trimester Your body will continue to go through many changes during pregnancy. The changes vary from woman to woman. During the third trimester:  Your weight will continue to increase. You can expect to gain 25-35 pounds (11-16 kg) by the end of the pregnancy.  You may begin to get stretch marks on your hips, abdomen, and breasts.  You may urinate more often because the fetus is moving lower into your pelvis and pressing on your bladder.  You may develop or continue to have heartburn. This is caused by increased hormones that slow down muscles in the digestive tract.  You may develop or continue to have constipation because increased hormones slow digestion and cause the muscles that push waste through your intestines to relax.  You may develop hemorrhoids. These are swollen veins (varicose veins) in the rectum that can itch or be painful.  You may develop swollen, bulging veins (varicose veins) in your legs.  You may have increased body aches in the pelvis, back, or thighs. This is due to weight gain and increased hormones that are relaxing your joints.  You may have changes in your hair. These can include thickening of your hair, rapid growth, and changes in texture. Some women also have hair loss during or after pregnancy, or hair that feels dry or thin. Your hair will most likely return to normal after your baby is born.  Your breasts will continue to grow and they will continue to become tender. A yellow fluid (colostrum) may leak from your breasts. This is the first milk you are producing for your baby.  Your belly button may stick out.  You may notice more swelling in your hands,  face, or ankles.  You may have increased tingling or numbness in your hands, arms, and legs. The skin on your belly may also feel numb.  You may feel short of breath because of your expanding uterus.  You may have more problems sleeping. This can be caused by the size of your belly, increased need to urinate, and an increase in your body's metabolism.  You may notice the fetus "dropping," or moving lower in your abdomen (lightening).  You may have increased vaginal discharge.  You may notice your joints feel loose and you may have pain around your pelvic bone.  What to expect at prenatal visits You will have prenatal exams every 2 weeks until week 36. Then you will have weekly prenatal exams. During a routine prenatal visit:  You will be weighed to make sure you and the baby are growing normally.  Your blood pressure will be taken.  Your abdomen will be measured to track your baby's growth.  The fetal heartbeat will be listened to.  Any test results from the previous visit will be discussed.  You may have a cervical check near your due date to see if your cervix has softened or thinned (effaced).  You will be tested for Group B streptococcus. This happens between 35 and 37 weeks.  Your health care provider may ask you:  What your birth plan is.  How you are feeling.  If you are feeling the baby move.  If you have had   any abnormal symptoms, such as leaking fluid, bleeding, severe headaches, or abdominal cramping.  If you are using any tobacco products, including cigarettes, chewing tobacco, and electronic cigarettes.  If you have any questions.  Other tests or screenings that may be performed during your third trimester include:  Blood tests that check for low iron levels (anemia).  Fetal testing to check the health, activity level, and growth of the fetus. Testing is done if you have certain medical conditions or if there are problems during the  pregnancy.  Nonstress test (NST). This test checks the health of your baby to make sure there are no signs of problems, such as the baby not getting enough oxygen. During this test, a belt is placed around your belly. The baby is made to move, and its heart rate is monitored during movement.  What is false labor? False labor is a condition in which you feel small, irregular tightenings of the muscles in the womb (contractions) that usually go away with rest, changing position, or drinking water. These are called Braxton Hicks contractions. Contractions may last for hours, days, or even weeks before true labor sets in. If contractions come at regular intervals, become more frequent, increase in intensity, or become painful, you should see your health care provider. What are the signs of labor?  Abdominal cramps.  Regular contractions that start at 10 minutes apart and become stronger and more frequent with time.  Contractions that start on the top of the uterus and spread down to the lower abdomen and back.  Increased pelvic pressure and dull back pain.  A watery or bloody mucus discharge that comes from the vagina.  Leaking of amniotic fluid. This is also known as your "water breaking." It could be a slow trickle or a gush. Let your health care provider know if it has a color or strange odor. If you have any of these signs, call your health care provider right away, even if it is before your due date. Follow these instructions at home: Medicines  Follow your health care provider's instructions regarding medicine use. Specific medicines may be either safe or unsafe to take during pregnancy.  Take a prenatal vitamin that contains at least 600 micrograms (mcg) of folic acid.  If you develop constipation, try taking a stool softener if your health care provider approves. Eating and drinking  Eat a balanced diet that includes fresh fruits and vegetables, whole grains, good sources of protein  such as meat, eggs, or tofu, and low-fat dairy. Your health care provider will help you determine the amount of weight gain that is right for you.  Avoid raw meat and uncooked cheese. These carry germs that can cause birth defects in the baby.  If you have low calcium intake from food, talk to your health care provider about whether you should take a daily calcium supplement.  Eat four or five small meals rather than three large meals a day.  Limit foods that are high in fat and processed sugars, such as fried and sweet foods.  To prevent constipation: ? Drink enough fluid to keep your urine clear or pale yellow. ? Eat foods that are high in fiber, such as fresh fruits and vegetables, whole grains, and beans. Activity  Exercise only as directed by your health care provider. Most women can continue their usual exercise routine during pregnancy. Try to exercise for 30 minutes at least 5 days a week. Stop exercising if you experience uterine contractions.  Avoid heavy   lifting.  Do not exercise in extreme heat or humidity, or at high altitudes.  Wear low-heel, comfortable shoes.  Practice good posture.  You may continue to have sex unless your health care provider tells you otherwise. Relieving pain and discomfort  Take frequent breaks and rest with your legs elevated if you have leg cramps or low back pain.  Take warm sitz baths to soothe any pain or discomfort caused by hemorrhoids. Use hemorrhoid cream if your health care provider approves.  Wear a good support bra to prevent discomfort from breast tenderness.  If you develop varicose veins: ? Wear support pantyhose or compression stockings as told by your healthcare provider. ? Elevate your feet for 15 minutes, 3-4 times a day. Prenatal care  Write down your questions. Take them to your prenatal visits.  Keep all your prenatal visits as told by your health care provider. This is important. Safety  Wear your seat belt at  all times when driving.  Make a list of emergency phone numbers, including numbers for family, friends, the hospital, and police and fire departments. General instructions  Avoid cat litter boxes and soil used by cats. These carry germs that can cause birth defects in the baby. If you have a cat, ask someone to clean the litter box for you.  Do not travel far distances unless it is absolutely necessary and only with the approval of your health care provider.  Do not use hot tubs, steam rooms, or saunas.  Do not drink alcohol.  Do not use any products that contain nicotine or tobacco, such as cigarettes and e-cigarettes. If you need help quitting, ask your health care provider.  Do not use any medicinal herbs or unprescribed drugs. These chemicals affect the formation and growth of the baby.  Do not douche or use tampons or scented sanitary pads.  Do not cross your legs for long periods of time.  To prepare for the arrival of your baby: ? Take prenatal classes to understand, practice, and ask questions about labor and delivery. ? Make a trial run to the hospital. ? Visit the hospital and tour the maternity area. ? Arrange for maternity or paternity leave through employers. ? Arrange for family and friends to take care of pets while you are in the hospital. ? Purchase a rear-facing car seat and make sure you know how to install it in your car. ? Pack your hospital bag. ? Prepare the baby's nursery. Make sure to remove all pillows and stuffed animals from the baby's crib to prevent suffocation.  Visit your dentist if you have not gone during your pregnancy. Use a soft toothbrush to brush your teeth and be gentle when you floss. Contact a health care provider if:  You are unsure if you are in labor or if your water has broken.  You become dizzy.  You have mild pelvic cramps, pelvic pressure, or nagging pain in your abdominal area.  You have lower back pain.  You have persistent  nausea, vomiting, or diarrhea.  You have an unusual or bad smelling vaginal discharge.  You have pain when you urinate. Get help right away if:  Your water breaks before 37 weeks.  You have regular contractions less than 5 minutes apart before 37 weeks.  You have a fever.  You are leaking fluid from your vagina.  You have spotting or bleeding from your vagina.  You have severe abdominal pain or cramping.  You have rapid weight loss or weight gain.    You have shortness of breath with chest pain.  You notice sudden or extreme swelling of your face, hands, ankles, feet, or legs.  Your baby makes fewer than 10 movements in 2 hours.  You have severe headaches that do not go away when you take medicine.  You have vision changes. Summary  The third trimester is from week 28 through week 40, months 7 through 9. The third trimester is a time when the unborn baby (fetus) is growing rapidly.  During the third trimester, your discomfort may increase as you and your baby continue to gain weight. You may have abdominal, leg, and back pain, sleeping problems, and an increased need to urinate.  During the third trimester your breasts will keep growing and they will continue to become tender. A yellow fluid (colostrum) may leak from your breasts. This is the first milk you are producing for your baby.  False labor is a condition in which you feel small, irregular tightenings of the muscles in the womb (contractions) that eventually go away. These are called Braxton Hicks contractions. Contractions may last for hours, days, or even weeks before true labor sets in.  Signs of labor can include: abdominal cramps; regular contractions that start at 10 minutes apart and become stronger and more frequent with time; watery or bloody mucus discharge that comes from the vagina; increased pelvic pressure and dull back pain; and leaking of amniotic fluid. This information is not intended to replace advice  given to you by your health care provider. Make sure you discuss any questions you have with your health care provider. Document Released: 06/01/2001 Document Revised: 11/13/2015 Document Reviewed: 08/08/2012 Elsevier Interactive Patient Education  2017 [employer]  

## 2018-04-12 NOTE — Progress Notes (Signed)
  Subjective  Fetal Movement? yes Contractions? no Leaking Fluid? no Vaginal Bleeding? no  Objective  BP 122/70   Wt 229 lb (103.9 kg)   LMP 10/07/2017 (Exact Date)   BMI 40.57 kg/m  General: NAD Pumonary: no increased work of breathing Abdomen: gravid, non-tender Extremities: no edema Psychiatric: mood appropriate, affect full  Assessment  32 y.o. G2P1001 at [redacted]w[redacted]d by  07/19/2018, by Ultrasound presenting for routine prenatal visit  Plan   Problem List Items Addressed This Visit      Other   Obesity affecting pregnancy, antepartum   Supervision of high risk pregnancy, antepartum    Other Visit Diagnoses    [redacted] weeks gestation of pregnancy    -  Primary    VBAC planned Pt desires labor in water tub, discussed for her to plan thru hospital for this, and limitations as she is VBAC and needs to have frequent access and availability to convert to CS if needed.  Shelby Major, MD, [facility], [facility] 04/12/2018  9:05 AM

## 2018-04-13 ENCOUNTER — Other Ambulatory Visit: Payer: Self-pay | Admitting: Obstetrics and Gynecology

## 2018-04-13 DIAGNOSIS — O0990 Supervision of high risk pregnancy, unspecified, unspecified trimester: Secondary | ICD-10-CM

## 2018-04-13 DIAGNOSIS — O99810 Abnormal glucose complicating pregnancy: Secondary | ICD-10-CM

## 2018-04-13 LAB — 28 WEEK RH+PANEL
Basophils Absolute: 0 10*3/uL (ref 0.0–0.2)
Basos: 0 %
EOS (ABSOLUTE): 0.1 10*3/uL (ref 0.0–0.4)
Eos: 1 %
Gestational Diabetes Screen: 165 mg/dL — ABNORMAL HIGH (ref 65–139)
HIV Screen 4th Generation wRfx: NONREACTIVE
Hematocrit: 32.4 % — ABNORMAL LOW (ref 34.0–46.6)
Hemoglobin: 11.1 g/dL (ref 11.1–15.9)
Immature Grans (Abs): 0.1 10*3/uL (ref 0.0–0.1)
Immature Granulocytes: 1 %
Lymphocytes Absolute: 1.5 10*3/uL (ref 0.7–3.1)
Lymphs: 11 %
MCH: 26.9 pg (ref 26.6–33.0)
MCHC: 34.3 g/dL (ref 31.5–35.7)
MCV: 79 fL (ref 79–97)
Monocytes Absolute: 0.4 10*3/uL (ref 0.1–0.9)
Monocytes: 3 %
Neutrophils Absolute: 11.2 10*3/uL — ABNORMAL HIGH (ref 1.4–7.0)
Neutrophils: 84 %
Platelets: 248 10*3/uL (ref 150–450)
RBC: 4.12 x10E6/uL (ref 3.77–5.28)
RDW: 13.5 % (ref 12.3–15.4)
RPR Ser Ql: NONREACTIVE
WBC: 13.2 10*3/uL — ABNORMAL HIGH (ref 3.4–10.8)

## 2018-04-13 NOTE — Progress Notes (Signed)
Would you mind letting her know she failed her 1h gtt and have her set up a 3h gtt? I will put in the order. Please let me know once you've spoken with her. Thank you!

## 2018-04-14 ENCOUNTER — Telehealth: Payer: Self-pay

## 2018-04-14 NOTE — Telephone Encounter (Signed)
Lm with pt to call back and get 3 hr gtt scheduled. Please help her schedule when

## 2018-04-14 NOTE — Telephone Encounter (Signed)
-----   Message from Conard Novak, MD sent at 04/13/2018  3:08 PM EDT ----- Would you mind letting her know she failed her 1h gtt and have her set up a 3h gtt? I will put in the order. Please let me know once you've spoken with her. Thank you!

## 2018-04-17 NOTE — Telephone Encounter (Signed)
Patient is schedule 04/24/18 °

## 2018-04-24 ENCOUNTER — Other Ambulatory Visit: Payer: Managed Care, Other (non HMO)

## 2018-04-25 ENCOUNTER — Encounter: Payer: Self-pay | Admitting: Certified Nurse Midwife

## 2018-04-25 ENCOUNTER — Ambulatory Visit (INDEPENDENT_AMBULATORY_CARE_PROVIDER_SITE_OTHER): Payer: Managed Care, Other (non HMO) | Admitting: Certified Nurse Midwife

## 2018-04-25 VITALS — BP 124/60 | Wt 225.0 lb

## 2018-04-25 DIAGNOSIS — Z3A27 27 weeks gestation of pregnancy: Secondary | ICD-10-CM

## 2018-04-25 DIAGNOSIS — O99213 Obesity complicating pregnancy, third trimester: Secondary | ICD-10-CM

## 2018-04-25 DIAGNOSIS — O368190 Decreased fetal movements, unspecified trimester, not applicable or unspecified: Secondary | ICD-10-CM

## 2018-04-25 DIAGNOSIS — O368120 Decreased fetal movements, second trimester, not applicable or unspecified: Secondary | ICD-10-CM

## 2018-04-25 DIAGNOSIS — O99212 Obesity complicating pregnancy, second trimester: Secondary | ICD-10-CM

## 2018-04-25 DIAGNOSIS — O0990 Supervision of high risk pregnancy, unspecified, unspecified trimester: Secondary | ICD-10-CM

## 2018-04-25 DIAGNOSIS — R300 Dysuria: Secondary | ICD-10-CM

## 2018-04-25 DIAGNOSIS — O26892 Other specified pregnancy related conditions, second trimester: Secondary | ICD-10-CM

## 2018-04-25 DIAGNOSIS — R109 Unspecified abdominal pain: Secondary | ICD-10-CM

## 2018-04-25 DIAGNOSIS — O26899 Other specified pregnancy related conditions, unspecified trimester: Secondary | ICD-10-CM

## 2018-04-25 LAB — POCT URINALYSIS DIPSTICK
Bilirubin, UA: NEGATIVE
Blood, UA: NEGATIVE
Glucose, UA: NEGATIVE
Ketones, UA: NEGATIVE
Nitrite, UA: NEGATIVE
Protein, UA: NEGATIVE
Spec Grav, UA: 1.015 (ref 1.010–1.025)
Urobilinogen, UA: NEGATIVE E.U./dL — AB
pH, UA: 7 (ref 5.0–8.0)

## 2018-04-25 LAB — POCT URINALYSIS DIPSTICK OB
Glucose, UA: NEGATIVE
POC,PROTEIN,UA: NEGATIVE

## 2018-04-25 NOTE — Progress Notes (Signed)
C/o stomach hurting and diarrhea yesterday; stomach still hurts today; last felt baby move this am; has been snacking on stuff today; had cappaccino this am.rj

## 2018-04-25 NOTE — Progress Notes (Signed)
Work in for decreased FM since yesterday, pressure since 2 November, and cramping today. Now 27wk 6days. Last felt baby move earlier today. No vaginal bleeding, vulvar irritation or itching. Painful intercourse 2 November and last night: sharp vaginal pains Has had some dysuria since yesterday. She also had 3 or 4 loose stools yesterday which she attributed to some food she ate on 3 November at a cook out. No blood in the stools. History is significant for a previous LTCS in 2013 for FTP and obesity Exam: General: gravid female in NAD Abdomen: gravid, non tender, well healed Pfannenstiel scar, FHT 145 Pelvic: Ext/BUS: no lesions or inflammation Vagina: small amount thin white discharge Wet prep: negative for hyphae, Trich or clue cells Cervix: thick, closed, posterior NST: reactive with baseline 140-145 and accelerations to 150-155 (10x 10 s)   Results for orders placed or performed in visit on 04/25/18 (from the past 24 hour(s))  POC Urinalysis Dipstick OB     Status: Normal   Collection Time: 04/25/18  3:20 PM  Result Value Ref Range   Color, UA     Clarity, UA     Glucose, UA Negative Negative   Bilirubin, UA     Ketones, UA     Spec Grav, UA     Blood, UA     pH, UA     POC Protein UA Negative Negative, Trace   Urobilinogen, UA     Nitrite, UA     Leukocytes, UA     Appearance     Odor    POCT urinalysis dipstick     Status: Abnormal   Collection Time: 04/25/18  3:34 PM  Result Value Ref Range   Color, UA     Clarity, UA     Glucose, UA Negative Negative   Bilirubin, UA neg    Ketones, UA neg    Spec Grav, UA 1.015 1.010 - 1.025   Blood, UA neg    pH, UA 7.0 5.0 - 8.0   Protein, UA Negative Negative   Urobilinogen, UA negative (A) 0.2 or 1.0 E.U./dL   Nitrite, UA neg    Leukocytes, UA Trace (A) Negative   Appearance     Odor     A: IUP at 27 weeks 6 days with lower abdominal cramping  No evidence of preterm labor  R/O UTI Decreased fetal movement: reactive NST  and mother started feeling more movement during NST.  P: Urine culture sent [facility] instructions/ given # for L&D if any further problems with decreased fetal movement. 3 hour GTT scheduled for 8 November-will also get growth scan and check AFI.  Farrel Conners, CNM

## 2018-04-27 LAB — URINE CULTURE

## 2018-04-28 ENCOUNTER — Ambulatory Visit (INDEPENDENT_AMBULATORY_CARE_PROVIDER_SITE_OTHER): Payer: Managed Care, Other (non HMO)

## 2018-04-28 ENCOUNTER — Other Ambulatory Visit: Payer: Managed Care, Other (non HMO)

## 2018-04-28 DIAGNOSIS — O0990 Supervision of high risk pregnancy, unspecified, unspecified trimester: Secondary | ICD-10-CM

## 2018-04-28 DIAGNOSIS — Z362 Encounter for other antenatal screening follow-up: Secondary | ICD-10-CM

## 2018-04-28 DIAGNOSIS — O99810 Abnormal glucose complicating pregnancy: Secondary | ICD-10-CM

## 2018-04-28 DIAGNOSIS — O99213 Obesity complicating pregnancy, third trimester: Secondary | ICD-10-CM

## 2018-04-29 LAB — GESTATIONAL GLUCOSE TOLERANCE
Glucose, Fasting: 87 mg/dL (ref 65–94)
Glucose, GTT - 1 Hour: 171 mg/dL (ref 65–179)
Glucose, GTT - 2 Hour: 146 mg/dL (ref 65–154)
Glucose, GTT - 3 Hour: 109 mg/dL (ref 65–139)

## 2018-05-17 ENCOUNTER — Encounter: Payer: Self-pay | Admitting: Advanced Practice Midwife

## 2018-05-17 ENCOUNTER — Encounter: Payer: Managed Care, Other (non HMO) | Admitting: Obstetrics & Gynecology

## 2018-05-17 ENCOUNTER — Ambulatory Visit (INDEPENDENT_AMBULATORY_CARE_PROVIDER_SITE_OTHER): Payer: Managed Care, Other (non HMO) | Admitting: Advanced Practice Midwife

## 2018-05-17 VITALS — BP 118/64 | Wt 228.0 lb

## 2018-05-17 DIAGNOSIS — O99213 Obesity complicating pregnancy, third trimester: Secondary | ICD-10-CM

## 2018-05-17 DIAGNOSIS — Z3A31 31 weeks gestation of pregnancy: Secondary | ICD-10-CM

## 2018-05-17 LAB — POCT URINALYSIS DIPSTICK OB: Glucose, UA: NEGATIVE

## 2018-05-17 NOTE — Progress Notes (Signed)
  Routine Prenatal Care Visit  Subjective  Shelby BeechBrittany M Azevedo is a 32 y.o. G2P1001 at 3567w0d being seen today for ongoing prenatal care.  She is currently monitored for the following issues for this high-risk pregnancy and has GOITER, UNSPECIFIED; PCOD (polycystic ovarian disease); Obesity affecting pregnancy, antepartum; BMI 39.0-39.9,adult; and Supervision of high risk pregnancy, antepartum on their problem list.  ----------------------------------------------------------------------------------- Patient reports no complaints.   Contractions: Not present. Vag. Bleeding: None.  Movement: Present. Denies leaking of fluid.  ----------------------------------------------------------------------------------- The following portions of the patient's history were reviewed and updated as appropriate: allergies, current medications, past family history, past medical history, past social history, past surgical history and problem list. Problem list updated.   Objective  Blood pressure 118/64, weight 228 lb (103.4 kg), last menstrual period 10/07/2017. Pregravid weight 220 lb (99.8 kg) Total Weight Gain 8 lb (3.629 kg) Urinalysis: Urine Protein Trace  Urine Glucose Negative  Fetal Status: Fetal Heart Rate (bpm): 144 Fundal Height: 32 cm Movement: Present     General:  Alert, oriented and cooperative. Patient is in no acute distress.  Skin: Skin is warm and dry. No rash noted.   Cardiovascular: Normal heart rate noted  Respiratory: Normal respiratory effort, no problems with respiration noted  Abdomen: Soft, gravid, appropriate for gestational age. Pain/Pressure: Absent     Pelvic:  Cervical exam deferred        Extremities: Normal range of motion.  Edema: None  Mental Status: Normal mood and affect. Normal behavior. Normal judgment and thought content.   Assessment   32 y.o. G2P1001 at 7967w0d by  07/19/2018, by Ultrasound presenting for routine prenatal visit  Plan   Pregnancy#2 Problems (from  10/07/17 to present)    Problem Noted Resolved   Obesity affecting pregnancy, antepartum 11/19/2017 by Tresea MallGledhill, Elihue Ebert, CNM No   BMI 39.0-39.9,adult 11/19/2017 by Tresea MallGledhill, Desman Polak, CNM No   Supervision of high risk pregnancy, antepartum 11/19/2017 by Tresea MallGledhill, Ozzy Bohlken, CNM No   Overview Addendum 04/25/2018  5:16 PM by Farrel ConnersGutierrez, Colleen, CNM    Clinic [facility] Prenatal Labs  Dating 6 wk US Blood type: A/Positive/-- (07/15 1428)   Genetic Screen  NIPS: normal XY Antibody:Negative (07/15 1428)  Anatomic US  incomplete Rubella: 5.43 (07/15 1428) Varicella: Immune  GTT Early:                Third trimester: 165 RPR: Non Reactive (07/15 1428)   Rhogam  not needed HBsAg: Negative (07/15 1428)   TDaP vaccine                        Flu Shot: 02/27/18 HIV: Non Reactive (07/15 1428)   Baby Food Breast                              GBS:   Contraception  Pap: 06/17/17, NILM  CBB     CS/VBAC 2013 LTCS:  Desires VBAC   Support Person Husband Montegus               Preterm labor symptoms and general obstetric precautions including but not limited to vaginal bleeding, contractions, leaking of fluid and fetal movement were reviewed in detail with the patient.   Return in about 2 weeks (around 05/31/2018) for rob.  Tresea MallJane Asli Tokarski, CNM 05/17/2018 3:34 PM

## 2018-05-17 NOTE — Progress Notes (Signed)
ocROB

## 2018-05-31 ENCOUNTER — Ambulatory Visit (INDEPENDENT_AMBULATORY_CARE_PROVIDER_SITE_OTHER): Payer: Managed Care, Other (non HMO) | Admitting: Obstetrics and Gynecology

## 2018-05-31 VITALS — BP 133/74 | Wt 228.0 lb

## 2018-05-31 DIAGNOSIS — O34219 Maternal care for unspecified type scar from previous cesarean delivery: Secondary | ICD-10-CM

## 2018-05-31 DIAGNOSIS — Z23 Encounter for immunization: Secondary | ICD-10-CM

## 2018-05-31 DIAGNOSIS — O99210 Obesity complicating pregnancy, unspecified trimester: Secondary | ICD-10-CM

## 2018-05-31 DIAGNOSIS — O99213 Obesity complicating pregnancy, third trimester: Secondary | ICD-10-CM

## 2018-05-31 DIAGNOSIS — O0990 Supervision of high risk pregnancy, unspecified, unspecified trimester: Secondary | ICD-10-CM

## 2018-05-31 DIAGNOSIS — Z3A33 33 weeks gestation of pregnancy: Secondary | ICD-10-CM

## 2018-05-31 NOTE — Progress Notes (Signed)
Routine Prenatal Care Visit  Subjective  Shelby Aguilar is a 32 y.o. G2P1001 at [redacted]w[redacted]d being seen today for ongoing prenatal care.  She is currently monitored for the following issues for this high-risk pregnancy and has GOITER, UNSPECIFIED; PCOD (polycystic ovarian disease); Obesity affecting pregnancy, antepartum; BMI 39.0-39.9,adult; Supervision of high risk pregnancy, antepartum; and History of cesarean section complicating pregnancy on their problem list.  ----------------------------------------------------------------------------------- Patient reports no complaints.   Contractions: Not present. Vag. Bleeding: None.  Movement: Present. Denies leaking of fluid.  ----------------------------------------------------------------------------------- The following portions of the patient's history were reviewed and updated as appropriate: allergies, current medications, past family history, past medical history, past social history, past surgical history and problem list. Problem list updated.   Objective  Blood pressure 133/74, weight 228 lb (103.4 kg), last menstrual period 10/07/2017. Pregravid weight 220 lb (99.8 kg) Total Weight Gain 8 lb (3.629 kg) Urinalysis:      Fetal Status: Fetal Heart Rate (bpm): 150 Fundal Height: 35 cm Movement: Present     General:  Alert, oriented and cooperative. Patient is in no acute distress.  Skin: Skin is warm and dry. No rash noted.   Cardiovascular: Normal heart rate noted  Respiratory: Normal respiratory effort, no problems with respiration noted  Abdomen: Soft, gravid, appropriate for gestational age. Pain/Pressure: Absent     Pelvic:  Cervical exam deferred        Extremities: Normal range of motion.     ental Status: Normal mood and affect. Normal behavior. Normal judgment and thought content.     Assessment   32 y.o. G2P1001 at [redacted]w[redacted]d by  07/19/2018, by Ultrasound presenting for routine prenatal visit  Plan   Pregnancy#2  Problems (from 10/07/17 to present)    Problem Noted Resolved   Obesity affecting pregnancy, antepartum 11/19/2017 by Tresea Mall, CNM No   BMI 39.0-39.9,adult 11/19/2017 by Tresea Mall, CNM No   Supervision of high risk pregnancy, antepartum 11/19/2017 by Tresea Mall, CNM No   Overview Addendum 04/25/2018  5:16 PM by Farrel Conners, CNM    [facility]inic [facility] Prenatal Labs  Dating 6 wk [place] Blood type: A/Positive/-- (07/15 1428)   Genetic Screen  NIPS: normal XY Antibody:Negative (07/15 1428)  Anatomic [place]  Complete Rubella: 5.43 (07/15 1428) Varicella: Immune  GTT Early:                Third trimester: 165 3-hr 87 / 171 / 146 / 109 RPR: Non Reactive (07/15 1428)   Rhogam  not needed HBsAg: Negative (07/15 1428)   TDaP vaccine 05/31/18 Flu Shot: 02/27/18 HIV: Non Reactive (07/15 1428)   Baby Food Breast                              GBS:   Contraception  Pap: 06/17/17, NILM  CBB     CS/VBAC 2013 LTCS active phase arrest:  Desires VBAC success 24.6% CI 21.6% to 27.9%   Support Person Husband Montegus               Gestational age appropriate obstetric precautions including but not limited to vaginal bleeding, contractions, leaking of fluid and fetal movement were reviewed in detail with the patient.    32 y.o. G2P1001 at [redacted]w[redacted]d with Estimated Date of Delivery: 07/19/18 was seen today in office to discuss trial of labor after cesarean section (TOLAC) versus elective repeat cesarean delivery (ERCD). The following risks were discussed with the patient.  Risk of uterine rupture at term is 0.78 percent with TOLAC and 0.22 percent with ERCD. 1 in 10 uterine ruptures will result in neonatal death or neurological injury. The benefits of a trial of labor after cesarean (TOLAC) resulting in a vaginal birth after cesarean (VBAC) include the following: shorter length of hospital stay and postpartum recovery (in most cases); fewer complications, such as postpartum fever, wound or uterine infection,  thromboembolism (blood clots in the leg or lung), need for blood transfusion and fewer neonatal breathing problems. The risks of an attempted VBAC or TOLAC include the following: Risk of failed trial of labor after cesarean (TOLAC) without a vaginal birth after cesarean (VBAC) resulting in repeat cesarean delivery (RCD) in about 20 to 40 percent of women who attempt VBAC.  Her individualized success rate using the MFMU VBAC risk calculator is 24.6%.   Risk of rupture of uterus resulting in an emergency cesarean delivery. The risk of uterine rupture may be related in part to the type of uterine incision made during the first cesarean delivery. A previous transverse uterine incision has the lowest risk of rupture (0.2 to 1.5 percent risk). Vertical or T-shaped uterine incisions have a higher risk of uterine rupture (4 to 9 percent risk)The risk of fetal death is very low with both VBAC and elective repeat cesarean delivery (ERCD), but the likelihood of fetal death is higher with VBAC than with ERCD. Maternal death is very rare with either type of delivery. The risks of an elective repeat cesarean delivery (ERCD) were reviewed with the patient including but not limited to: 07/998 risk of uterine rupture which could have serious consequences, bleeding which may require transfusion; infection which may require antibiotics; injury to bowel, bladder or other surrounding organs (bowel, bladder, ureters); injury to the fetus; need for additional procedures including hysterectomy in the event of a life-threatening hemorrhage; thromboembolic phenomenon; abnormal placentation; incisional problems; death and other postoperative or anesthesia complications.    In addition we discussed that our collective office practice is to allow patient's who desire to attempt TOLAC to go into labor naturally.  There is some limited data that rupture rate may increase past [redacted] weeks gestation, but it is reasonable for women who are  strongly committed to [facility] to continue pregnancy into the 41st week.  Medical indications necessetating early delivery may arise during the course of any pregnancy.  Given the contraindication on the use of prostaglandins for use in cervical ripening,  recommendation would be to proceed with repeat cesarean for delivery for patient's with unfavorable cervix (low Bishops score) who reach 41 weeks or who otherwise have a medical indication for early delivery.   These risks and benefits are summarized on the consent form, which was reviewed with the patient during the visit.  All her questions answered and she signed a consent indicating a preference for TOLAC/ERCD. A copy of the consent was given to the patient.  MFMU VBAC calculator success rate of 24.6 with 95% CI of 21.6% to 27.9%.  The patient was counseled regarding recommendation for elective repeat cesarean section (ERCS) given that she has failed to go into spontaneous labor by [redacted]w[redacted]d gestation and has an unfavorable cervix.  Previous studies examining have noted an increased risk or maternal and fetal morbidity for patient attempting a trial of labor whose success rated is <70% compared to [facility], while morbidity was similar for patient attempting a trial of labor vs ERCS with success rates >70. ("Can a prediction model for vaginal birth  after cesarean section also predict the probablity of  Morbidity related to a trial of labor?" American Jour[employer] of Obstetric and Gynecology 2009  January) - patient still desires TOLAC  Return in about 2 weeks (around 06/14/2018) for 2 weeks ROB, 3 weeks ROB/NST/Growth scan.  Vena AustriaAndreas Blimie Vaness, MD, Evern CoreFACOG [facility], [facility] 05/31/2018, 10:37 AM

## 2018-05-31 NOTE — Progress Notes (Signed)
ROB °TDAP today ° °

## 2018-06-16 ENCOUNTER — Ambulatory Visit (INDEPENDENT_AMBULATORY_CARE_PROVIDER_SITE_OTHER): Payer: Managed Care, Other (non HMO) | Admitting: Obstetrics and Gynecology

## 2018-06-16 ENCOUNTER — Ambulatory Visit (INDEPENDENT_AMBULATORY_CARE_PROVIDER_SITE_OTHER): Payer: Managed Care, Other (non HMO)

## 2018-06-16 VITALS — BP 120/80 | Wt 234.0 lb

## 2018-06-16 DIAGNOSIS — O99213 Obesity complicating pregnancy, third trimester: Secondary | ICD-10-CM | POA: Diagnosis not present

## 2018-06-16 DIAGNOSIS — O0990 Supervision of high risk pregnancy, unspecified, unspecified trimester: Secondary | ICD-10-CM

## 2018-06-16 DIAGNOSIS — O99210 Obesity complicating pregnancy, unspecified trimester: Secondary | ICD-10-CM

## 2018-06-16 DIAGNOSIS — O34219 Maternal care for unspecified type scar from previous cesarean delivery: Secondary | ICD-10-CM | POA: Diagnosis not present

## 2018-06-16 DIAGNOSIS — Z3A35 35 weeks gestation of pregnancy: Secondary | ICD-10-CM

## 2018-06-16 DIAGNOSIS — Z3A36 36 weeks gestation of pregnancy: Secondary | ICD-10-CM

## 2018-06-16 DIAGNOSIS — Z3A32 32 weeks gestation of pregnancy: Secondary | ICD-10-CM | POA: Diagnosis not present

## 2018-06-16 LAB — POCT URINALYSIS DIPSTICK OB: Glucose, UA: NEGATIVE

## 2018-06-16 NOTE — Progress Notes (Addendum)
Routine Prenatal Care Visit  Subjective  Shelby Aguilar Wickham is a 32 y.o. G2P1001 at 6927w5d being seen today for ongoing prenatal care.  She is currently monitored for the following issues for this high-risk pregnancy and has GOITER, UNSPECIFIED; PCOD (polycystic ovarian disease); Obesity affecting pregnancy, antepartum; BMI 39.0-39.9,adult; Supervision of high risk pregnancy, antepartum; and History of cesarean section complicating pregnancy on their problem list.  ----------------------------------------------------------------------------------- Patient reports no complaints.    . Vag. Bleeding: None.  Movement: Present. Denies leaking of fluid.  ----------------------------------------------------------------------------------- The following portions of the patient's history were reviewed and updated as appropriate: allergies, current medications, past family history, past medical history, past social history, past surgical history and problem list. Problem list updated.   Objective  Blood pressure 120/80, weight 234 lb (106.1 kg), last menstrual period 10/07/2017. Pregravid weight 220 lb (99.8 kg) Total Weight Gain 14 lb (6.35 kg)  Body mass index is 41.45 kg/Aguilar.  Urinalysis:      Fetal Status: Fetal Heart Rate (bpm): 140 Fundal Height: 38 cm Movement: Present     General:  Alert, oriented and cooperative. Patient is in no acute distress.  Skin: Skin is warm and dry. No rash noted.   Cardiovascular: Normal heart rate noted  Respiratory: Normal respiratory effort, no problems with respiration noted  Abdomen: Soft, gravid, appropriate for gestational age. Pain/Pressure: Present     Pelvic:  Cervical exam deferred        Extremities: Normal range of motion.     ental Status: Normal mood and affect. Normal behavior. Normal judgment and thought content.   [place] Ob Follow Up  Result Date: 06/23/2018 ULTRASOUND REPORT Location: [facility] Date of Service: 06/16/2018 Patient Name:  Shelby Aguilar Fahl DOB: 1985-10-21 MRN: [id number] Indications:growth/afi Findings: Mason JimSingleton intrauterine pregnancy is visualized with FHR at 139 BPM. Biometrics give an (U/S) Gestational age of 765w1d and an (U/S) EDD of 07/20/2018; this correlates with the clinically established Estimated Date of Delivery: 07/19/18. Fetal presentation is Cephalic. Placenta: posterior. Grade: 1 AFI: 17.60 cm Growth percentile is 42nd %. EFW: 2,586 grams (5 lb 11 oz) Impression: 1. 1824w2d Viable Singleton Intrauterine pregnancy previously established criteria. 2. Growth is 42nd %ile.  AFI is 17.60 cm. Recommendations: 1.Clinical correlation with the patient's History and Physical Exam. Mital bahen Leodis BinetP Patel, RDMS Review of ULTRASOUND.    [facility]    Plan of management to be discussed with patient. Annamarie MajorPaul Harris, MD, FACOG [facility], [facility] 06/23/2018  10:17 AM     Assessment   32 y.o. G2P1001 at 5427w5d by  07/19/2018, by Ultrasound presenting for routine prenatal visit  Plan   Pregnancy#2 Problems (from 10/07/17 to present)    Problem Noted Resolved   History of cesarean section complicating pregnancy 05/31/2018 by Vena AustriaStaebler, Gittel Mccamish, MD No   Overview Addendum 05/31/2018 10:38 AM by Vena AustriaStaebler, Lewayne Pauley, MD    MFMU VBAC calculator success rate of 24.6 with 95% CI of 21.6% to 27.9%.  Active phase arrest.      Obesity affecting pregnancy, antepartum 11/19/2017 by Tresea MallGledhill, Jane, CNM No   BMI 39.0-39.9,adult 11/19/2017 by Tresea MallGledhill, Jane, CNM No   Supervision of high risk pregnancy, antepartum 11/19/2017 by Tresea MallGledhill, Jane, CNM No   Overview Addendum 06/25/2018  8:13 PM by Farrel ConnersGutierrez, Colleen, CNM    [facility]inic [facility] Prenatal Labs  Dating 6 wk US Blood type: A/Positive/-- (07/15 1428)   Genetic Screen  NIPS: normal  XY Antibody:Negative (07/15 1428)  Anatomic US Complete Rubella: 5.43 (07/15 1428) Varicella: Immune  GTT Third trimester: 165  RPR: Non Reactive (07/15 1428)   Rhogam  not needed HBsAg: Negative (07/15 1428)   TDaP vaccine 05/31/18                     Flu Shot: 02/27/18 HIV: Non Reactive (07/15 1428)   Baby Food Breast                              GBS: positive  Contraception Minipill Pap: 06/17/17, NILM  CBB     CS/VBAC 2013 LTCS:  Desires VBAC   Support Person Husband Montegus               Gestational age appropriate obstetric precautions including but not limited to vaginal bleeding, contractions, leaking of fluid and fetal movement were reviewed in detail with the patient.    Return in about 1 week (around 06/23/2018) for ROB.  Vena AustriaAndreas Tanner Yeley, MD, FACOG [facility], [facility] 06/26/2018, 1:08 PM   Reactive nst 130 Normal growth Blood pressure 120/80, weight 234 lb (106.1 kg), last menstrual period 10/07/2017.

## 2018-06-23 ENCOUNTER — Ambulatory Visit (INDEPENDENT_AMBULATORY_CARE_PROVIDER_SITE_OTHER): Payer: Managed Care, Other (non HMO) | Admitting: Certified Nurse Midwife

## 2018-06-23 ENCOUNTER — Encounter: Payer: Managed Care, Other (non HMO) | Admitting: Obstetrics & Gynecology

## 2018-06-23 ENCOUNTER — Encounter: Payer: Self-pay | Admitting: Certified Nurse Midwife

## 2018-06-23 VITALS — BP 116/54 | Wt 233.0 lb

## 2018-06-23 DIAGNOSIS — Z3A36 36 weeks gestation of pregnancy: Secondary | ICD-10-CM

## 2018-06-23 DIAGNOSIS — O34219 Maternal care for unspecified type scar from previous cesarean delivery: Secondary | ICD-10-CM

## 2018-06-23 DIAGNOSIS — O0990 Supervision of high risk pregnancy, unspecified, unspecified trimester: Secondary | ICD-10-CM

## 2018-06-23 DIAGNOSIS — Z3685 Encounter for antenatal screening for Streptococcus B: Secondary | ICD-10-CM

## 2018-06-23 LAB — POCT URINALYSIS DIPSTICK OB
Glucose, UA: NEGATIVE
POC,PROTEIN,UA: NEGATIVE

## 2018-06-23 NOTE — Progress Notes (Signed)
ROB Pain in right groin GBS/Aptima today

## 2018-06-23 NOTE — Progress Notes (Signed)
ROB at [id number]: Doing well. [name] active Will be breast feeding/ minipill for contraception Desires VBAC. No regular contractions, Vaginal bleeding or leakage of fluid. GBS done Cervix:L/C/OOP ROB 1 week  Shelby Aguilar, CNM

## 2018-06-25 LAB — STREP GP B NAA: Strep Gp B NAA: POSITIVE — AB

## 2018-06-28 ENCOUNTER — Ambulatory Visit (INDEPENDENT_AMBULATORY_CARE_PROVIDER_SITE_OTHER): Payer: Managed Care, Other (non HMO) | Admitting: Obstetrics and Gynecology

## 2018-06-28 ENCOUNTER — Encounter: Payer: Managed Care, Other (non HMO) | Admitting: Obstetrics and Gynecology

## 2018-06-28 ENCOUNTER — Encounter: Payer: Self-pay | Admitting: Obstetrics and Gynecology

## 2018-06-28 VITALS — BP 120/65 | Wt 236.0 lb

## 2018-06-28 DIAGNOSIS — O34219 Maternal care for unspecified type scar from previous cesarean delivery: Secondary | ICD-10-CM

## 2018-06-28 DIAGNOSIS — Z3A37 37 weeks gestation of pregnancy: Secondary | ICD-10-CM

## 2018-06-28 DIAGNOSIS — O99213 Obesity complicating pregnancy, third trimester: Secondary | ICD-10-CM

## 2018-06-28 DIAGNOSIS — O0990 Supervision of high risk pregnancy, unspecified, unspecified trimester: Secondary | ICD-10-CM

## 2018-06-28 DIAGNOSIS — O99210 Obesity complicating pregnancy, unspecified trimester: Secondary | ICD-10-CM

## 2018-06-28 LAB — POCT URINALYSIS DIPSTICK OB: Glucose, UA: NEGATIVE

## 2018-06-28 NOTE — Progress Notes (Signed)
Routine Prenatal Care Visit  Subjective  Shelby Aguilar is a 33 y.o. G2P1001 at [redacted]w[redacted]d being seen today for ongoing prenatal care.  She is currently monitored for the following issues for this high-risk pregnancy and has GOITER, UNSPECIFIED; PCOD (polycystic ovarian disease); Obesity affecting pregnancy, antepartum; Supervision of high risk pregnancy, antepartum; and History of cesarean section complicating pregnancy on their problem list.  ----------------------------------------------------------------------------------- Patient reports no complaints.   Contractions: Not present. Vag. Bleeding: None.  Movement: Present. Denies leaking of fluid.  ----------------------------------------------------------------------------------- The following portions of the patient's history were reviewed and updated as appropriate: allergies, current medications, past family history, past medical history, past social history, past surgical history and problem list. Problem list updated.   Objective  Blood pressure 120/65, weight 236 lb (107 kg), last menstrual period 10/07/2017. Pregravid weight 220 lb (99.8 kg) Total Weight Gain 16 lb (7.258 kg)  Body mass index is 41.81 kg/m.  Urinalysis:      Fetal Status: Fetal Heart Rate (bpm): 130 Fundal Height: 39 cm Movement: Present  Presentation: Vertex  General:  Alert, oriented and cooperative. Patient is in no acute distress.  Skin: Skin is warm and dry. No rash noted.   Cardiovascular: Normal heart rate noted  Respiratory: Normal respiratory effort, no problems with respiration noted  Abdomen: Soft, gravid, appropriate for gestational age. Pain/Pressure: Present     Pelvic:  Cervical exam deferred        Extremities: Normal range of motion.     ental Status: Normal mood and affect. Normal behavior. Normal judgment and thought content.   Baseline: 130 Variability: moderate Accelerations: present Decelerations: absent Tocometry: N/A The  patient was monitored for 30 minutes, fetal heart rate tracing was deemed reactive, category I tracing,  Assessment   32 y.o. G2P1001 at [redacted]w[redacted]d by  07/19/2018, by Ultrasound presenting for routine prenatal visit  Plan   Pregnancy#2 Problems (from 10/07/17 to present)    Problem Noted Resolved   History of cesarean section complicating pregnancy 05/31/2018 by Vena Austria, MD No   Overview Addendum 05/31/2018 10:38 AM by Vena Austria, MD    MFMU VBAC calculator success rate of 24.6 with 95% CI of 21.6% to 27.9%.  Active phase arrest.      Obesity affecting pregnancy, antepartum 11/19/2017 by Tresea Mall, CNM No   BMI 39.0-39.9,adult 11/19/2017 by Tresea Mall, CNM No   Supervision of high risk pregnancy, antepartum 11/19/2017 by Tresea Mall, CNM No   Overview Addendum 06/25/2018  8:13 PM by Farrel Conners, CNM    [facility]inic [facility] Prenatal Labs  Dating 6 wk [place] Blood type: A/Positive/-- (07/15 1428)   Genetic Screen  NIPS: normal XY Antibody:Negative (07/15 1428)  Anatomic [place] Complete Rubella: 5.43 (07/15 1428) Varicella: Immune  GTT Third trimester: 165 RPR: Non Reactive (07/15 1428)   Rhogam  not needed HBsAg: Negative (07/15 1428)   TDaP vaccine 05/31/18                     Flu Shot: 02/27/18 HIV: Non Reactive (07/15 1428)   Baby Food Breast                              GBS: positive  Contraception Minipill Pap: 06/17/17, NILM  CBB     CS/VBAC 2013 LTCS:  Desires VBAC   Support Person Husband Montegus               Gestational  age appropriate obstetric precautions including but not limited to vaginal bleeding, contractions, leaking of fluid and fetal movement were reviewed in detail with the patient.     Return in about 1 week (around 07/05/2018) for ROB, NST, AFI.  Vena AustriaAndreas Dayton Kenley, MD, Evern CoreFACOG [facility], [facility] 06/28/2018, 8:38 AM

## 2018-06-28 NOTE — Progress Notes (Signed)
ROB

## 2018-07-05 ENCOUNTER — Encounter: Payer: Self-pay | Admitting: Advanced Practice Midwife

## 2018-07-05 ENCOUNTER — Ambulatory Visit (INDEPENDENT_AMBULATORY_CARE_PROVIDER_SITE_OTHER): Payer: Managed Care, Other (non HMO)

## 2018-07-05 ENCOUNTER — Ambulatory Visit (INDEPENDENT_AMBULATORY_CARE_PROVIDER_SITE_OTHER): Payer: Managed Care, Other (non HMO) | Admitting: Advanced Practice Midwife

## 2018-07-05 VITALS — BP 112/70 | Wt 239.0 lb

## 2018-07-05 DIAGNOSIS — Z3A38 38 weeks gestation of pregnancy: Secondary | ICD-10-CM | POA: Diagnosis not present

## 2018-07-05 DIAGNOSIS — O34219 Maternal care for unspecified type scar from previous cesarean delivery: Secondary | ICD-10-CM

## 2018-07-05 DIAGNOSIS — O0990 Supervision of high risk pregnancy, unspecified, unspecified trimester: Secondary | ICD-10-CM

## 2018-07-05 DIAGNOSIS — O99213 Obesity complicating pregnancy, third trimester: Secondary | ICD-10-CM | POA: Diagnosis not present

## 2018-07-05 DIAGNOSIS — O99210 Obesity complicating pregnancy, unspecified trimester: Secondary | ICD-10-CM

## 2018-07-05 DIAGNOSIS — Z362 Encounter for other antenatal screening follow-up: Secondary | ICD-10-CM | POA: Diagnosis not present

## 2018-07-05 NOTE — Progress Notes (Signed)
ROB/US/NST No concerns

## 2018-07-05 NOTE — Patient Instructions (Signed)
Preparing for Vaginal Birth After Cesarean Delivery  Vaginal birth after cesarean delivery (VBAC) is giving birth vaginally after previously delivering a baby through a cesarean section (C-section). You and your health are provider will discuss your options and whether you may be a good candidate for VBAC.  What are my options?  After a cesarean delivery, your options for future deliveries may include:   Scheduled repeat cesarean delivery. This is done in a hospital with an operating room.   Trial of labor after cesarean (TOLAC). A successful TOLAC results in a vaginal delivery. If it is not successful, you will need to have a cesarean delivery. TOLAC should be attempted in facilities where an emergency cesarean delivery can be performed. It should not be done as a home birth.  Talk with your health care provider about the risks and benefits of each option early in your pregnancy. The best option for you will depend on your preferences and your overall health as well as your baby's.  What should I know about my past cesarean delivery?  It is important to know what type of incision was made in your uterus in a past cesarean delivery. The type of incision can affect the success of your TOLAC. Types of incisions include:   Low transverse. This is a side-to-side cut low on your uterus. The scar on your skin looks like a horizontal line just above your pubic area. This type of cut is the most common and makes you a good candidate for TOLAC.   Low vertical. This is an up-and-down cut low on your uterus. The scar on your skin looks like a vertical line between your pubic area and belly button. This type of cut puts you at higher risk for problems during TOLAC.   High vertical or classical. This is an up-and-down cut high on your uterus. The scar on your skin looks like a vertical line that runs over the top of your belly button. This type of cut has the highest risk for problems and usually means that TOLAC is not an  option.  When is VBAC not an option?  As you progress through your pregnancy, circumstances may change and you may need to reconsider your options. Your situation may also change even as you begin TOLAC. Your health care provider may not want you to attempt a VBAC if you:   Need to have labor started (induced) because your cervix is not ready for labor.   Have never had a vaginal delivery.   Have had more than two cesarean deliveries.   Are overdue.   Are pregnant with a very large baby.   Have a condition that causes high blood pressure (preeclampsia).  Questions to ask your health care provider   Am I a good candidate for TOLAC?   What are my chances of a successful vaginal delivery?   Is my preferred birth location equipped for a TOLAC?   What are my pain management options during a TOLAC?  Where to find more information   American Congress of Obstetricians and Gynecologists: www.acog.org   American College of Nurse-Midwives: www.midwife.org  Summary   Vaginal birth after cesarean delivery (VBAC) is giving birth vaginally after previously delivering a baby through a cesarean section (C-section).   VBAC may be a safe and appropriate option for you depending on your medical history and other risk factors. Talk with your health care provider about the options available to you, and the risks and benefits of each early   in your pregnancy.   TOLAC should be attempted in facilities where emergency cesarean section procedures can be performed.  This information is not intended to replace advice given to you by your health care provider. Make sure you discuss any questions you have with your health care provider.  Document Released: 02/23/2011 Document Revised: 09/16/2016 Document Reviewed: 09/16/2016  Elsevier Interactive Patient Education  2019 [employer]vier Inc[employer]

## 2018-07-05 NOTE — Progress Notes (Signed)
Routine Prenatal Care Visit  Subjective  Shelby Aguilar is a 33 y.o. G2P1001 at [redacted]w[redacted]d being seen today for ongoing prenatal care.  She is currently monitored for the following issues for this high-risk pregnancy and has GOITER, UNSPECIFIED; PCOD (polycystic ovarian disease); Obesity affecting pregnancy, antepartum; Supervision of high risk pregnancy, antepartum; and History of cesarean section complicating pregnancy on their problem list.  ----------------------------------------------------------------------------------- Patient reports some painful contractions, more braxton hicks.   Contractions: Irregular. Vag. Bleeding: None.  Movement: Present. Denies leaking of fluid.  ----------------------------------------------------------------------------------- The following portions of the patient's history were reviewed and updated as appropriate: allergies, current medications, past family history, past medical history, past social history, past surgical history and problem list. Problem list updated.   Objective  Blood pressure 112/70, weight 239 lb (108.4 kg), last menstrual period 10/07/2017 Pregravid weight 220 lb (99.8 kg) Total Weight Gain 19 lb (8.618 kg) Urinalysis: Urine Protein    Urine Glucose    Fetal Status: Fetal Heart Rate (bpm): 130   Movement: Present  Presentation: Vertex  AFI: 18 NST: 20 minute tracing reactive, 130 bpm, moderate variability, +accelerations to 170, 1 deep variable to 90 following buzzer and rapid return to baseline  General:  Alert, oriented and cooperative. Patient is in no acute distress.  Skin: Skin is warm and dry. No rash noted.   Cardiovascular: Normal heart rate noted  Respiratory: Normal respiratory effort, no problems with respiration noted  Abdomen: Soft, gravid, appropriate for gestational age. Pain/Pressure: Present     Pelvic:  Cervical exam deferred        Extremities: Normal range of motion.     Mental Status: Normal mood and  affect. Normal behavior. Normal judgment and thought content.   Assessment   32 y.o. G2P1001 at [redacted]w[redacted]d by  07/19/2018, by Ultrasound presenting for routine prenatal visit  Plan   Pregnancy#2 Problems (from 10/07/17 to present)    Problem Noted Resolved   History of cesarean section complicating pregnancy 05/31/2018 by Vena Austria, MD No   Overview Addendum 05/31/2018 10:38 AM by Vena Austria, MD    MFMU VBAC calculator success rate of 24.6 with 95% CI of 21.6% to 27.9%.  Active phase arrest.      Obesity affecting pregnancy, antepartum 11/19/2017 by Tresea Mall, CNM No   Supervision of high risk pregnancy, antepartum 11/19/2017 by Tresea Mall, CNM No   Overview Addendum 06/28/2018  8:19 AM by Vena Austria, MD    [facility]inic [facility] Prenatal Labs  Dating 6 wk [place] Blood type: A/Positive/-- (07/15 1428)   Genetic Screen  NIPS: normal XY Antibody:Negative (07/15 1428)  Anatomic [place] Complete Rubella: 5.43 (07/15 1428) Varicella: Immune  GTT Third trimester: 165 RPR: Non Reactive (07/15 1428)   Rhogam  not needed HBsAg: Negative (07/15 1428)   TDaP vaccine 05/31/18                     Flu Shot: 02/27/18 HIV: Non Reactive (07/15 1428)   Baby Food Breast                              GBS: positive  Contraception Minipill Pap: 06/17/17, NILM  CBB     CS/VBAC 2013 LTCS:  Desires VBAC CPD and 8lbs 13oz Growth [redacted]w[redacted]d 2,586 grams (5 lb 11 oz) 42%ile  Support Person Husband Montegus           BMI 39.0-39.9,adult 11/19/2017 by Tresea Mall, CNM 06/28/2018  by Vena Austria, MD       Term labor symptoms and general obstetric precautions including but not limited to vaginal bleeding, contractions, leaking of fluid and fetal movement were reviewed in detail with the patient. Preparing for VBAC information given.  Please refer to After Visit Summary for other counseling recommendations.   Return in about 1 week (around 07/12/2018) for AFI/NST/ROB.  Tresea Mall, CNM 07/05/2018 3:56  PM

## 2018-07-12 ENCOUNTER — Ambulatory Visit (INDEPENDENT_AMBULATORY_CARE_PROVIDER_SITE_OTHER): Payer: Managed Care, Other (non HMO) | Admitting: Maternal Newborn

## 2018-07-12 ENCOUNTER — Encounter: Payer: Self-pay | Admitting: Maternal Newborn

## 2018-07-12 ENCOUNTER — Ambulatory Visit (INDEPENDENT_AMBULATORY_CARE_PROVIDER_SITE_OTHER): Payer: Managed Care, Other (non HMO)

## 2018-07-12 VITALS — BP 124/78 | Wt 240.0 lb

## 2018-07-12 DIAGNOSIS — Z3A39 39 weeks gestation of pregnancy: Secondary | ICD-10-CM | POA: Diagnosis not present

## 2018-07-12 DIAGNOSIS — O99213 Obesity complicating pregnancy, third trimester: Secondary | ICD-10-CM

## 2018-07-12 DIAGNOSIS — O0993 Supervision of high risk pregnancy, unspecified, third trimester: Secondary | ICD-10-CM

## 2018-07-12 DIAGNOSIS — O0990 Supervision of high risk pregnancy, unspecified, unspecified trimester: Secondary | ICD-10-CM

## 2018-07-12 DIAGNOSIS — Z3689 Encounter for other specified antenatal screening: Secondary | ICD-10-CM | POA: Diagnosis not present

## 2018-07-12 DIAGNOSIS — O99210 Obesity complicating pregnancy, unspecified trimester: Secondary | ICD-10-CM

## 2018-07-12 DIAGNOSIS — O34219 Maternal care for unspecified type scar from previous cesarean delivery: Secondary | ICD-10-CM | POA: Diagnosis not present

## 2018-07-12 DIAGNOSIS — Z369 Encounter for antenatal screening, unspecified: Secondary | ICD-10-CM

## 2018-07-12 NOTE — Progress Notes (Signed)
Afi/ nst today. No vb. No lof 

## 2018-07-12 NOTE — Progress Notes (Signed)
Routine Prenatal Care Visit  Subjective  Shelby Aguilar is a 33 y.o. G2P1001 at 6365w0d being seen today for ongoing prenatal care.  She is currently monitored for the following issues for this high-risk pregnancy and has GOITER, UNSPECIFIED; PCOD (polycystic ovarian disease); Obesity affecting pregnancy, antepartum; Supervision of high risk pregnancy, antepartum; and History of cesarean section complicating pregnancy on their problem list.  ----------------------------------------------------------------------------------- Patient reports occasional contractions.   Contractions: Irregular. Vag. Bleeding: None.  Movement: Present. No leaking of fluid.  ----------------------------------------------------------------------------------- The following portions of the patient's history were reviewed and updated as appropriate: allergies, current medications, past family history, past medical history, past social history, past surgical history and problem list. Problem list updated.  Objective  Blood pressure 124/78, weight 240 lb (108.9 kg), last menstrual period 10/07/2017. Pregravid weight 220 lb (99.8 kg) Total Weight Gain 20 lb (9.072 kg) Body mass index is 42.51 kg/m.   Fetal Status: Fetal Heart Rate (bpm): 135   Movement: Present  Presentation: Vertex  General:  Alert, oriented and cooperative. Patient is in no acute distress.  Skin: Skin is warm and dry. No rash noted.   Cardiovascular: Normal heart rate noted  Respiratory: Normal respiratory effort, no problems with respiration noted  Abdomen: Soft, gravid, appropriate for gestational age. Pain/Pressure: Present     Pelvic:  Cervical exam performed Dilation: Closed Effacement (%): 40 Station: -3  Extremities: Normal range of motion.     Mental Status: Normal mood and affect. Normal behavior. Normal judgment and thought content.   NST Baseline: 135 Variability: moderate Accelerations: present Decelerations:  absent Tocometry: not done The patient was monitored for 20 minutes, fetal heart rate tracing was deemed reactive.  Assessment   32 y.o. G2P1001 at 9365w0d, EDD 07/19/2018 by Ultrasound presenting for a routine prenatal visit.  Plan   Pregnancy#2 Problems (from 10/07/17 to present)    Problem Noted Resolved   History of cesarean section complicating pregnancy 05/31/2018 by Vena AustriaStaebler, Andreas, MD No   Overview Addendum 05/31/2018 10:38 AM by Vena AustriaStaebler, Andreas, MD    MFMU VBAC calculator success rate of 24.6 with 95% CI of 21.6% to 27.9%.  Active phase arrest.      Obesity affecting pregnancy, antepartum 11/19/2017 by Tresea MallGledhill, Jane, CNM No   Supervision of high risk pregnancy, antepartum 11/19/2017 by Tresea MallGledhill, Jane, CNM No   Overview Addendum 06/28/2018  8:19 AM by Vena AustriaStaebler, Andreas, MD    [facility]inic [facility] Prenatal Labs  Dating 6 wk US Blood type: A/Positive/-- (07/15 1428)   Genetic Screen  NIPS: normal XY Antibody:Negative (07/15 1428)  Anatomic US Complete Rubella: 5.43 (07/15 1428) Varicella: Immune  GTT Third trimester: 165 RPR: Non Reactive (07/15 1428)   Rhogam  not needed HBsAg: Negative (07/15 1428)   TDaP vaccine 05/31/18                     Flu Shot: 02/27/18 HIV: Non Reactive (07/15 1428)   Baby Food Breast                              GBS: positive  Contraception Minipill Pap: 06/17/17, NILM  CBB     CS/VBAC 2013 LTCS:  Desires VBAC CPD and 8lbs 13oz Growth 4244w1d 2,586 grams (5 lb 11 oz) 42%ile  Support Person Husband Montegus           BMI 39.0-39.9,adult 11/19/2017 by Tresea MallGledhill, Jane, CNM 06/28/2018 by Vena AustriaStaebler, Andreas, MD  Reactive NST, AFI 16.1, cephalic position.  Discussed plans if labor does not begin on its own. She prefers not to be induced in this case. Will schedule Cesarean as back-up if needed and call with details on date and time.  Term labor symptoms and general obstetric precautions including but not limited to vaginal bleeding, contractions, leaking of  fluid and fetal movement were reviewed.  Please refer to After Visit Summary for other counseling recommendations.   Return in about 1 week (around 07/19/2018) for ROB with AFI/NST.  Marcelyn Bruins, CNM 07/12/2018

## 2018-07-13 NOTE — Patient Instructions (Signed)

## 2018-07-14 ENCOUNTER — Telehealth: Payer: Self-pay | Admitting: Obstetrics & Gynecology

## 2018-07-14 NOTE — Telephone Encounter (Signed)
-----   Message from Oswaldo Conroy, CNM sent at 07/13/2018  4:37 PM EST ----- Regarding: Schedule Cesarean 2/5 Surgery Booking Request Patient Full Name:  Shelby Aguilar MRN: [id number]  DOB: Dec 07, 1985  Surgeon: Nadara Mustard, MD Requested Surgery Date and Time: 07/26/2018, 1330 Primary Diagnosis AND Code: Repeat Cesarean Secondary Diagnosis and Code:  Surgical Procedure: Cesarean Section L&D Notification: Yes Admission Status: surgery admit Length of Surgery: 60 minutes Special Case Needs: On-Q pump H&P: Yes, needs (date) Phone Interview???: No Interpreter: Language:  Medical Clearance: None Special Scheduling Instructions: OK per RPH to schedule on this day/time. Please ensure clinic schedule is blocked. Thanks!

## 2018-07-14 NOTE — Telephone Encounter (Signed)
Lmtrc

## 2018-07-14 NOTE — Telephone Encounter (Signed)
Patient returned the call, and is aware of H&P at [facility] on 07/25/18 @ 9:00am w/ Dr Tiburcio Pea, Pre-admit Testing afterwards, and OR on 07/26/18.

## 2018-07-20 ENCOUNTER — Ambulatory Visit (INDEPENDENT_AMBULATORY_CARE_PROVIDER_SITE_OTHER): Payer: Managed Care, Other (non HMO)

## 2018-07-20 ENCOUNTER — Ambulatory Visit (INDEPENDENT_AMBULATORY_CARE_PROVIDER_SITE_OTHER): Payer: Managed Care, Other (non HMO) | Admitting: Obstetrics & Gynecology

## 2018-07-20 VITALS — BP 138/80 | Wt 242.0 lb

## 2018-07-20 DIAGNOSIS — O480 Post-term pregnancy: Secondary | ICD-10-CM | POA: Diagnosis not present

## 2018-07-20 DIAGNOSIS — O99213 Obesity complicating pregnancy, third trimester: Secondary | ICD-10-CM

## 2018-07-20 DIAGNOSIS — O0990 Supervision of high risk pregnancy, unspecified, unspecified trimester: Secondary | ICD-10-CM

## 2018-07-20 DIAGNOSIS — Z3A40 40 weeks gestation of pregnancy: Secondary | ICD-10-CM

## 2018-07-20 DIAGNOSIS — O34219 Maternal care for unspecified type scar from previous cesarean delivery: Secondary | ICD-10-CM

## 2018-07-20 DIAGNOSIS — O99210 Obesity complicating pregnancy, unspecified trimester: Secondary | ICD-10-CM

## 2018-07-20 DIAGNOSIS — Z369 Encounter for antenatal screening, unspecified: Secondary | ICD-10-CM

## 2018-07-20 LAB — POCT URINALYSIS DIPSTICK OB
Glucose, UA: NEGATIVE
POC,PROTEIN,UA: NEGATIVE

## 2018-07-20 NOTE — Progress Notes (Signed)
  Subjective  Fetal Movement? yes Contractions? no Leaking Fluid? no Vaginal Bleeding? no  Objective  BP 138/80   Wt 242 lb (109.8 kg)   LMP 10/07/2017 (Exact Date)   BMI 42.87 kg/m  General: NAD Pumonary: no increased work of breathing Abdomen: gravid, non-tender Extremities: no edema Psychiatric: mood appropriate, affect full  Assessment  32 y.o. G2P1001 at [redacted]w[redacted]d by  07/19/2018, by Ultrasound presenting for routine prenatal visit  Plan   Problem List Items Addressed This Visit      Other   Obesity affecting pregnancy, antepartum   History of cesarean section complicating pregnancy    Other Visit Diagnoses    [redacted] weeks gestation of pregnancy    -  Primary   Relevant Orders   POC Urinalysis Dipstick OB (Completed)    Desires TOLAC if natural labor this week; discussed cervix/ low bishop and not recommended for induction of labor.    Plan CS Wed as scheduled if no change in cervix by then A NST procedure was performed with FHR monitoring and a normal baseline established, appropriate time of 20-40 minutes of evaluation, and accels >2 seen w 15x15 characteristics.  Results show a REACTIVE NST.   Review of ULTRASOUND.    I have personally reviewed images and report of recent ultrasound done at [facility].    Plan of management to be discussed with patient.  Annamarie Major, MD, [facility], [facility] 07/20/2018  3:53 PM

## 2018-07-25 ENCOUNTER — Ambulatory Visit (INDEPENDENT_AMBULATORY_CARE_PROVIDER_SITE_OTHER): Payer: Managed Care, Other (non HMO) | Admitting: Obstetrics & Gynecology

## 2018-07-25 ENCOUNTER — Other Ambulatory Visit: Payer: Self-pay

## 2018-07-25 ENCOUNTER — Inpatient Hospital Stay: Payer: Managed Care, Other (non HMO) | Admitting: Anesthesiology

## 2018-07-25 ENCOUNTER — Encounter
Admission: EM | Disposition: A | Discharge status: Home / Self Care | Payer: Self-pay | Source: Home / Self Care | Attending: Obstetrics and Gynecology

## 2018-07-25 ENCOUNTER — Inpatient Hospital Stay: Admission: RE | Admit: 2018-07-25 | Payer: Self-pay | Source: Ambulatory Visit

## 2018-07-25 ENCOUNTER — Inpatient Hospital Stay
Admission: EM | Admit: 2018-07-25 | Discharge: 2018-07-29 | DRG: 787 | Disposition: A | Discharge status: Home / Self Care | Payer: Managed Care, Other (non HMO) | Attending: Obstetrics and Gynecology | Admitting: Obstetrics and Gynecology

## 2018-07-25 ENCOUNTER — Encounter: Payer: Self-pay | Admitting: Obstetrics & Gynecology

## 2018-07-25 VITALS — BP 148/80 | Ht 63.0 in | Wt 244.0 lb

## 2018-07-25 DIAGNOSIS — Z3A40 40 weeks gestation of pregnancy: Secondary | ICD-10-CM

## 2018-07-25 DIAGNOSIS — O99210 Obesity complicating pregnancy, unspecified trimester: Secondary | ICD-10-CM

## 2018-07-25 DIAGNOSIS — O0990 Supervision of high risk pregnancy, unspecified, unspecified trimester: Secondary | ICD-10-CM

## 2018-07-25 DIAGNOSIS — O34219 Maternal care for unspecified type scar from previous cesarean delivery: Secondary | ICD-10-CM

## 2018-07-25 DIAGNOSIS — O34211 Maternal care for low transverse scar from previous cesarean delivery: Secondary | ICD-10-CM

## 2018-07-25 DIAGNOSIS — O99820 Streptococcus B carrier state complicating pregnancy: Secondary | ICD-10-CM

## 2018-07-25 DIAGNOSIS — D62 Acute posthemorrhagic anemia: Secondary | ICD-10-CM | POA: Diagnosis not present

## 2018-07-25 DIAGNOSIS — O3663X0 Maternal care for excessive fetal growth, third trimester, not applicable or unspecified: Secondary | ICD-10-CM | POA: Diagnosis present

## 2018-07-25 DIAGNOSIS — O480 Post-term pregnancy: Secondary | ICD-10-CM

## 2018-07-25 DIAGNOSIS — O9081 Anemia of the puerperium: Secondary | ICD-10-CM | POA: Diagnosis not present

## 2018-07-25 DIAGNOSIS — O99213 Obesity complicating pregnancy, third trimester: Secondary | ICD-10-CM

## 2018-07-25 DIAGNOSIS — O99824 Streptococcus B carrier state complicating childbirth: Secondary | ICD-10-CM | POA: Diagnosis not present

## 2018-07-25 LAB — TYPE AND SCREEN
ABO/RH(D): A POS
Antibody Screen: NEGATIVE

## 2018-07-25 LAB — CBC
HCT: 37.3 % (ref 36.0–46.0)
Hemoglobin: 12.1 g/dL (ref 12.0–15.0)
MCH: 26.3 pg (ref 26.0–34.0)
MCHC: 32.4 g/dL (ref 30.0–36.0)
MCV: 81.1 fL (ref 80.0–100.0)
Platelets: 204 10*3/uL (ref 150–400)
RBC: 4.6 MIL/uL (ref 3.87–5.11)
RDW: 15.6 % — ABNORMAL HIGH (ref 11.5–15.5)
WBC: 14.6 10*3/uL — ABNORMAL HIGH (ref 4.0–10.5)
nRBC: 0 % (ref 0.0–0.2)

## 2018-07-25 SURGERY — Surgical Case
Anesthesia: Epidural | Site: Abdomen

## 2018-07-25 MED ORDER — ONDANSETRON HCL 4 MG/2ML IJ SOLN: 4.0000 mg | Freq: Four times a day (QID) | INTRAMUSCULAR | Status: DC | PRN

## 2018-07-25 MED ORDER — LACTATED RINGERS IV SOLN
INTRAVENOUS | Status: DC
  Administered 2018-07-25 – 2018-07-26 (×3): via INTRAVENOUS

## 2018-07-25 MED ORDER — LIDOCAINE HCL (PF) 1 % IJ SOLN: 30.0000 mL | INTRAMUSCULAR | Status: DC | PRN

## 2018-07-25 MED ORDER — BUPIVACAINE 0.25 % ON-Q PUMP DUAL CATH 400 ML
400.0000 mL | INJECTION | Status: DC
  Filled 2018-07-25: qty 400

## 2018-07-25 MED ORDER — CARBOPROST TROMETHAMINE 250 MCG/ML IM SOLN
INTRAMUSCULAR | Status: AC
  Filled 2018-07-25: qty 1

## 2018-07-25 MED ORDER — PHENYLEPHRINE 40 MCG/ML (10ML) SYRINGE FOR IV PUSH (FOR BLOOD PRESSURE SUPPORT): 80.0000 ug | PREFILLED_SYRINGE | INTRAVENOUS | Status: DC | PRN

## 2018-07-25 MED ORDER — BUTORPHANOL TARTRATE 2 MG/ML IJ SOLN: 1.0000 mg | INTRAMUSCULAR | Status: DC | PRN

## 2018-07-25 MED ORDER — EPHEDRINE 5 MG/ML INJ: 10.0000 mg | INTRAVENOUS | Status: DC | PRN

## 2018-07-25 MED ORDER — MISOPROSTOL 200 MCG PO TABS
ORAL_TABLET | ORAL | Status: AC
  Filled 2018-07-25: qty 4

## 2018-07-25 MED ORDER — FENTANYL 2.5 MCG/ML W/ROPIVACAINE 0.15% IN NS 100 ML EPIDURAL (ARMC)
12.0000 mL/h | EPIDURAL | Status: DC
  Administered 2018-07-25: 12 mL/h via EPIDURAL

## 2018-07-25 MED ORDER — SODIUM CHLORIDE 0.9 % IV SOLN
5.0000 10*6.[IU] | Freq: Once | INTRAVENOUS | Status: AC
  Administered 2018-07-25: 5 10*6.[IU] via INTRAVENOUS
  Filled 2018-07-25: qty 5

## 2018-07-25 MED ORDER — AMMONIA AROMATIC IN INHA
RESPIRATORY_TRACT | Status: AC
  Filled 2018-07-25: qty 10

## 2018-07-25 MED ORDER — LIDOCAINE-EPINEPHRINE (PF) 1.5 %-1:200000 IJ SOLN
INTRAMUSCULAR | Status: DC | PRN
  Administered 2018-07-25: 3 mL via PERINEURAL

## 2018-07-25 MED ORDER — DIPHENHYDRAMINE HCL 50 MG/ML IJ SOLN: 12.5000 mg | INTRAMUSCULAR | Status: DC | PRN

## 2018-07-25 MED ORDER — OXYTOCIN BOLUS FROM INFUSION: 500.0000 mL | Freq: Once | INTRAVENOUS | Status: DC

## 2018-07-25 MED ORDER — MISOPROSTOL 200 MCG PO TABS: 800.0000 ug | ORAL_TABLET | Freq: Once | ORAL | Status: DC | PRN

## 2018-07-25 MED ORDER — AMMONIA AROMATIC IN INHA: 0.3000 mL | Freq: Once | RESPIRATORY_TRACT | Status: DC | PRN

## 2018-07-25 MED ORDER — LACTATED RINGERS IV SOLN
500.0000 mL | INTRAVENOUS | Status: DC | PRN
  Administered 2018-07-26: 1000 mL via INTRAVENOUS

## 2018-07-25 MED ORDER — LACTATED RINGERS IV SOLN: 500.0000 mL | Freq: Once | INTRAVENOUS | Status: DC

## 2018-07-25 MED ORDER — LIDOCAINE HCL (PF) 1 % IJ SOLN
INTRAMUSCULAR | Status: DC | PRN
  Administered 2018-07-25: 3 mL

## 2018-07-25 MED ORDER — FENTANYL 2.5 MCG/ML W/ROPIVACAINE 0.15% IN NS 100 ML EPIDURAL (ARMC)
EPIDURAL | Status: AC
  Filled 2018-07-25: qty 100

## 2018-07-25 MED ORDER — OXYTOCIN 40 UNITS IN NORMAL SALINE INFUSION - SIMPLE MED
2.5000 [IU]/h | INTRAVENOUS | Status: DC
  Administered 2018-07-26: 1 mL via INTRAVENOUS
  Filled 2018-07-25: qty 1000

## 2018-07-25 MED ORDER — PENICILLIN G 3 MILLION UNITS IVPB - SIMPLE MED
3.0000 10*6.[IU] | INTRAVENOUS | Status: DC
  Administered 2018-07-25 (×2): 3 10*6.[IU] via INTRAVENOUS
  Filled 2018-07-25 (×3): qty 100
  Filled 2018-07-25 (×2): qty 3
  Filled 2018-07-25 (×2): qty 100

## 2018-07-25 MED ORDER — SOD CITRATE-CITRIC ACID 500-334 MG/5ML PO SOLN
30.0000 mL | ORAL | Status: AC
  Administered 2018-07-26: 30 mL via ORAL

## 2018-07-25 MED ORDER — METHYLERGONOVINE MALEATE 0.2 MG/ML IJ SOLN
INTRAMUSCULAR | Status: AC
  Filled 2018-07-25: qty 1

## 2018-07-25 MED ORDER — OXYTOCIN 10 UNIT/ML IJ SOLN
INTRAMUSCULAR | Status: AC
  Filled 2018-07-25: qty 2

## 2018-07-25 MED ORDER — CEFAZOLIN SODIUM-DEXTROSE 2-4 GM/100ML-% IV SOLN
2.0000 g | INTRAVENOUS | Status: DC
  Filled 2018-07-25: qty 100

## 2018-07-25 MED ORDER — LIDOCAINE HCL (PF) 1 % IJ SOLN
INTRAMUSCULAR | Status: AC
  Filled 2018-07-25: qty 30

## 2018-07-25 MED ORDER — BUPIVACAINE HCL (PF) 0.25 % IJ SOLN
INTRAMUSCULAR | Status: DC | PRN
  Administered 2018-07-25: 10 mL via EPIDURAL

## 2018-07-25 MED ORDER — BUPIVACAINE HCL (PF) 0.5 % IJ SOLN
INTRAMUSCULAR | Status: AC
  Filled 2018-07-25: qty 30

## 2018-07-25 SURGICAL SUPPLY — 31 items
CANISTER SUCT 3000ML PPV (MISCELLANEOUS) ×3 IMPLANT
CHLORAPREP W/TINT 26ML (MISCELLANEOUS) ×6 IMPLANT
DERMABOND ADVANCED (GAUZE/BANDAGES/DRESSINGS) ×2
DERMABOND ADVANCED .7 DNX12 (GAUZE/BANDAGES/DRESSINGS) ×1 IMPLANT
DRESSING SURGICEL FIBRLLR 1X2 (HEMOSTASIS) IMPLANT
DRSG OPSITE POSTOP 4X10 (GAUZE/BANDAGES/DRESSINGS) ×3 IMPLANT
DRSG SURGICEL FIBRILLAR 1X2 (HEMOSTASIS)
ELECT CAUTERY BLADE 6.4 (BLADE) ×3 IMPLANT
ELECT REM PT RETURN 9FT ADLT (ELECTROSURGICAL) ×3
ELECTRODE REM PT RTRN 9FT ADLT (ELECTROSURGICAL) ×1 IMPLANT
GLOVE BIO SURGEON STRL SZ 6.5 (GLOVE) ×6 IMPLANT
GLOVE BIO SURGEONS STRL SZ 6.5 (GLOVE) ×3
GLOVE BIOGEL PI IND STRL 6.5 (GLOVE) ×2 IMPLANT
GLOVE BIOGEL PI IND STRL 7.5 (GLOVE) ×2 IMPLANT
GLOVE BIOGEL PI INDICATOR 6.5 (GLOVE) ×4
GLOVE BIOGEL PI INDICATOR 7.5 (GLOVE) ×4
GOWN STRL REUS W/ TWL LRG LVL3 (GOWN DISPOSABLE) ×1 IMPLANT
GOWN STRL REUS W/ TWL XL LVL3 (GOWN DISPOSABLE) ×3 IMPLANT
GOWN STRL REUS W/TWL LRG LVL3 (GOWN DISPOSABLE) ×2
GOWN STRL REUS W/TWL XL LVL3 (GOWN DISPOSABLE) ×6
NS IRRIG 1000ML POUR BTL (IV SOLUTION) ×3 IMPLANT
PACK C SECTION AR (MISCELLANEOUS) ×3 IMPLANT
PAD OB MATERNITY 4.3X12.25 (PERSONAL CARE ITEMS) ×6 IMPLANT
PAD PREP 24X41 OB/GYN DISP (PERSONAL CARE ITEMS) ×3 IMPLANT
RETRACTOR TRAXI PANNICULUS (MISCELLANEOUS) ×1 IMPLANT
SUT MNCRL AB 4-0 PS2 18 (SUTURE) ×3 IMPLANT
SUT PLAIN 3-0 (SUTURE) ×3 IMPLANT
SUT VIC AB 0 CT1 36 (SUTURE) ×9 IMPLANT
SUT VIC AB 2-0 CT1 36 (SUTURE) ×3 IMPLANT
SYR 30ML LL (SYRINGE) ×6 IMPLANT
TRAXI PANNICULUS RETRACTOR (MISCELLANEOUS) ×2

## 2018-07-25 NOTE — Progress Notes (Signed)
VBAC protocol initiated. Dr. Jerene Pitch notified, OR charge nurse notified and Dr.Fitzgerald (anes) notified.

## 2018-07-25 NOTE — Progress Notes (Signed)
SVE 7,70,-3 Cervix swollen and edematous  Will proceed with cesarean section for arrest of dilation and fetal intolerance of labor Discussed with patient and family  Adelene Idler MD [facility], [facility] 07/25/2018 11:53 PM

## 2018-07-25 NOTE — Anesthesia Procedure Notes (Signed)
Epidural Patient location during procedure: OB Start time: 07/25/2018 8:44 PM End time: 07/25/2018 8:59 PM  Staffing Anesthesiologist: Yves Dill, MD Performed: anesthesiologist   Preanesthetic Checklist Completed: patient identified, site marked, surgical consent, pre-op evaluation, timeout performed, IV checked, risks and benefits discussed and monitors and equipment checked  Epidural Patient position: sitting Prep: Betadine Patient monitoring: heart rate, continuous pulse ox and blood pressure Approach: midline Location: L4-L5 Injection technique: LOR air  Needle:  Needle type: Tuohy  Needle gauge: 17 G Needle length: 9 cm and 9 Catheter type: closed end flexible Catheter size: 19 Gauge Test dose: negative and 1.5% lidocaine with Epi 1:200 K  Assessment Sensory level: T8 Events: blood not aspirated, injection not painful, no injection resistance, negative IV test and no paresthesia  Additional Notes Time out called.  Patient placed in sitting position. The back was prepped and draped in sterile fashion.  A skin wheal was made in the L4-L5 interspace with 1% Lidocaine plain.  A 17G Tuohy needle was advanced into the epidural space by a loss of resistance technique.  The epidural catheter was advanced 3 cm into the space and the TD was negative.  No blood or paresthesias.  The patient tolerated the procedure well and the catheter was affixed to the back in sterile fashion.Reason for block:procedure for pain

## 2018-07-25 NOTE — Patient Instructions (Signed)

## 2018-07-25 NOTE — Anesthesia Preprocedure Evaluation (Signed)
Anesthesia Evaluation  Patient identified by MRN, date of birth, ID band Patient awake    Reviewed: Allergy & Precautions, NPO status , Patient's Chart, lab work & pertinent test results  Airway Mallampati: II       Dental no notable dental hx.    Pulmonary asthma ,    Pulmonary exam normal        Cardiovascular negative cardio ROS Normal cardiovascular exam     Neuro/Psych negative neurological ROS  negative psych ROS   GI/Hepatic negative GI ROS, Neg liver ROS,   Endo/Other  negative endocrine ROS  Renal/GU negative Renal ROS  negative genitourinary   Musculoskeletal negative musculoskeletal ROS (+)   Abdominal Normal abdominal exam  (+)   Peds negative pediatric ROS (+)  Hematology negative hematology ROS (+)   Anesthesia Other Findings   Reproductive/Obstetrics                             Anesthesia Physical Anesthesia Plan  ASA: II  Anesthesia Plan: Epidural   Post-op Pain Management:    Induction:   PONV Risk Score and Plan:   Airway Management Planned: Natural Airway  Additional Equipment:   Intra-op Plan:   Post-operative Plan:   Informed Consent: I have reviewed the patients History and Physical, chart, labs and discussed the procedure including the risks, benefits and alternatives for the proposed anesthesia with the patient or authorized representative who has indicated his/her understanding and acceptance.     Dental advisory given  Plan Discussed with: CRNA and Surgeon  Anesthesia Plan Comments:         Anesthesia Quick Evaluation

## 2018-07-25 NOTE — Progress Notes (Addendum)
L&D Progress Note 33 year old G2 P1001 with history of prior CS for FTP (4000 gm baby) presented from office in early labor. Cervix was 3/80%/-3 on arrival.   Clinic [facility] Prenatal Labs  Dating 6 wk [place] Blood type: A/Positive/-- (07/15 1428)   Genetic Screen  NIPS: normal XY Antibody:Negative (07/15 1428)  Anatomic [place] Complete Rubella: 5.43 (07/15 1428) Varicella: Immune  GTT Third trimester: 165 RPR: Non Reactive (07/15 1428)   Rhogam  not needed HBsAg: Negative (07/15 1428)   TDaP vaccine 05/31/18                     Flu Shot: 02/27/18 HIV: Non Reactive (07/15 1428)   Baby Food Breast                              GBS: positive  Contraception Minipill Pap: 06/17/17, NILM  CBB     CS/VBAC 2013 LTCS:  Desires VBAC CPD and 8lbs 13oz Growth [redacted]w[redacted]d 2,586 grams (5 lb 11 oz) 42%ile  Support Person Husband Montegus      S: Contractions have become more frequent since she has been on L&D  O: BP 122/78 (BP Location: Left Arm)   Pulse 89   Temp 98.2 F (36.8 C) (Oral)   Resp 16   Ht 5\' 3"  (1.6 m)   Wt 110 kg   LMP 10/07/2017 (Exact Date)   BMI 42.96 kg/m    General: gravid BF, pauses with contractions to breathe FHR: 135-140 with accelerations to 150s to 160, moderate variability, occasiona mild variable deceleration Toco: Contractions every 3-4 minutes apart Cervix: 4/90-100%/-3/ BBOW EFW 8-9# Baby is LOA on ultrasound   A: IUP at 40wk6 day in labor-cervix has changed Prior Cesarean section-desires VBAC   P: Institute VBAC protocol-Dr [facility] notified. [place] states that she has been counseled on the risks and benefits of TOLAC vs repeat Cesarean section. She is aware of the risks of uterine rupture with TOLAC as well as the risks of Cesarean section for fetal intolerance or failure to progress.  IV already started. GBS positive: start PCN for PPX Monitor progress and fetal maternal well being.   Farrel Conners, CNM

## 2018-07-25 NOTE — Progress Notes (Signed)
HISTORY AND PHYSICAL EXAM  HPI:  Shelby Aguilar is a 33 y.o. G2P1001.  Patient's last menstrual period was 10/07/2017 (exact date).  5875w6d Estimated Date of Delivery: 07/19/18  She is being admitted for labor sx's today, was planning for CS at 41 weeks if still pregnant.  Ctxs since last night, every 4-5 min now, moderate.  PMHx: She  has a past medical history of Asthma and PCOS (polycystic ovarian syndrome). Also,  has a past surgical history that includes Cesarean section (2013)., family history includes Breast cancer (age of onset: 1740) in her paternal grandmother.,  reports that she has never smoked. She has never used smokeless tobacco. She reports current alcohol use. She reports that she does not use drugs. OB History  Gravida Para Term Preterm AB Living  2 1 1     1   SAB TAB Ectopic Multiple Live Births          1    # Outcome Date GA Lbr Len/2nd Weight Sex Delivery Anes PTL Lv  2 Current           1 Term 12/15/11 416w5d  8 lb 13 oz (3.997 kg) M CS-LTranv  N LIV     Complications: Cephalopelvic Disproportion, Failure to Progress in First Stage    Obstetric Comments  IOL with G1 for PUPPS  Patient denies any other pertinent gynecologic issues. See prenatal record for more complete H&P   Current Outpatient Medications:  .  albuterol (PROVENTIL HFA;VENTOLIN HFA) 108 (90 Base) MCG/ACT inhaler, Inhale 2 puffs into the lungs every 6 (six) hours as needed for wheezing or shortness of breath., Disp: , Rfl:  .  Docosahexaenoic Acid (DHA) 200 MG CAPS, Take 1 capsule by mouth daily. , Disp: , Rfl:  .  ferrous sulfate 325 (65 FE) MG tablet, Take 325 mg by mouth daily with breakfast., Disp: , Rfl:  .  Prenatal Vit-Fe Fumarate-FA (MULTIVITAMIN-PRENATAL) 27-0.8 MG TABS tablet, Take 1 tablet by mouth daily. , Disp: , Rfl:  Also, has No Known Allergies.  Review of Systems  Constitutional: Negative for chills, fever and malaise/fatigue.  HENT: Negative for congestion, sinus pain and  sore throat.   Eyes: Negative for blurred vision and pain.  Respiratory: Negative for cough and wheezing.   Cardiovascular: Negative for chest pain and leg swelling.  Gastrointestinal: Negative for abdominal pain, constipation, diarrhea, heartburn, nausea and vomiting.  Genitourinary: Negative for dysuria, frequency, hematuria and urgency.  Musculoskeletal: Negative for back pain, joint pain, myalgias and neck pain.  Skin: Negative for itching and rash.  Neurological: Negative for dizziness, tremors and weakness.  Endo/Heme/Allergies: Does not bruise/bleed easily.  Psychiatric/Behavioral: Negative for depression. The patient is not nervous/anxious and does not have insomnia.     Objective: BP (!) 148/80   Ht 5\' 3"  (1.6 m)   Wt 244 lb (110.7 kg)   LMP 10/07/2017 (Exact Date)   BMI 43.22 kg/m  Filed Weights   07/25/18 0912  Weight: 244 lb (110.7 kg)   Physical Exam Constitutional:      General: She is not in acute distress.    Appearance: She is well-developed.  Genitourinary:     Pelvic exam was performed with patient supine.     Vagina, uterus and rectum normal.     No lesions in the vagina.     No vaginal bleeding.     No cervical motion tenderness, friability, lesion or polyp.     Uterus is mobile.  Uterus is not enlarged.     No uterine mass detected.    Uterus is midaxial.     No right or left adnexal mass present.     Right adnexa not tender.     Left adnexa not tender.     Genitourinary Comments: CX 3/80/-3, VTX  HENT:     Head: Normocephalic and atraumatic. No laceration.     Right Ear: Hearing normal.     Left Ear: Hearing normal.     Mouth/Throat:     Pharynx: Uvula midline.  Eyes:     Pupils: Pupils are equal, round, and reactive to light.  Neck:     Musculoskeletal: Normal range of motion and neck supple.     Thyroid: No thyromegaly.  Cardiovascular:     Rate and Rhythm: Normal rate and regular rhythm.     Heart sounds: No murmur. No friction rub.  No gallop.   Pulmonary:     Effort: Pulmonary effort is normal. No respiratory distress.     Breath sounds: Normal breath sounds. No wheezing.  Chest:     Breasts:        Right: No mass, skin change or tenderness.        Left: No mass, skin change or tenderness.  Abdominal:     General: Bowel sounds are normal. There is no distension.     Palpations: Abdomen is soft.     Tenderness: There is no abdominal tenderness. There is no rebound.  Musculoskeletal: Normal range of motion.  Neurological:     Mental Status: She is alert and oriented to person, place, and time.     Cranial Nerves: No cranial nerve deficit.  Skin:    General: Skin is warm and dry.  Psychiatric:        Judgment: Judgment normal.  Vitals signs reviewed.   Assessment: 1. History of cesarean section complicating pregnancy   2. [redacted] weeks gestation of pregnancy   3. Obesity affecting pregnancy in third trimester    PLAN: 1.  Cesarean Delivery Scheduled 07/26/2018, but as she is in early labor now, will admit and manage with VBAC precuations. 2. GBS ABX needed 3. Analgesia as desired 4. Plans to breast feed 5. TDaP UTD  Annamarie Major, M.D. 07/25/2018 9:36 AM

## 2018-07-25 NOTE — Progress Notes (Signed)
Patient comfortable with the epidural.  SVE: 7, 90%, -3 station AROM- meconium  NST: 145 bpm baseline, moderate variability, 15x15 accelerations, variable and late decelerations, no repetitive decelerations. Tocometer : every 2-4 minutes  Will recheck in 2 hours or sooner if indicated.   Adelene Idler MD [facility], [facility] 07/25/2018 10:48 PM

## 2018-07-26 ENCOUNTER — Inpatient Hospital Stay
Admission: RE | Admit: 2018-07-26 | Payer: Managed Care, Other (non HMO) | Source: Home / Self Care | Admitting: Obstetrics & Gynecology

## 2018-07-26 ENCOUNTER — Encounter: Admission: RE | Payer: Self-pay | Source: Home / Self Care

## 2018-07-26 DIAGNOSIS — O99824 Streptococcus B carrier state complicating childbirth: Secondary | ICD-10-CM

## 2018-07-26 LAB — CBC
HCT: 30.3 % — ABNORMAL LOW (ref 36.0–46.0)
Hemoglobin: 9.9 g/dL — ABNORMAL LOW (ref 12.0–15.0)
MCH: 26.5 pg (ref 26.0–34.0)
MCHC: 32.7 g/dL (ref 30.0–36.0)
MCV: 81 fL (ref 80.0–100.0)
Platelets: 169 10*3/uL (ref 150–400)
RBC: 3.74 MIL/uL — ABNORMAL LOW (ref 3.87–5.11)
RDW: 15.4 % (ref 11.5–15.5)
WBC: 18.4 10*3/uL — ABNORMAL HIGH (ref 4.0–10.5)
nRBC: 0 % (ref 0.0–0.2)

## 2018-07-26 LAB — RPR: RPR Ser Ql: NONREACTIVE

## 2018-07-26 LAB — ABO/RH: ABO/RH(D): A POS

## 2018-07-26 SURGERY — Surgical Case
Anesthesia: Choice

## 2018-07-26 MED ORDER — BISACODYL 10 MG RE SUPP: 10.0000 mg | Freq: Every day | RECTAL | Status: DC | PRN

## 2018-07-26 MED ORDER — ZOLPIDEM TARTRATE 5 MG PO TABS: 5.0000 mg | ORAL_TABLET | Freq: Every evening | ORAL | Status: DC | PRN

## 2018-07-26 MED ORDER — 0.9 % SODIUM CHLORIDE (POUR BTL) OPTIME
TOPICAL | Status: DC | PRN
  Administered 2018-07-26: 1000 mL

## 2018-07-26 MED ORDER — SENNOSIDES-DOCUSATE SODIUM 8.6-50 MG PO TABS
2.0000 | ORAL_TABLET | ORAL | Status: DC
  Administered 2018-07-26 – 2018-07-27 (×2): 2 via ORAL
  Filled 2018-07-26 (×2): qty 2

## 2018-07-26 MED ORDER — MIDAZOLAM HCL 2 MG/2ML IJ SOLN
INTRAMUSCULAR | Status: DC | PRN
  Administered 2018-07-26 (×2): 1 mg via INTRAVENOUS

## 2018-07-26 MED ORDER — MORPHINE SULFATE (PF) 2 MG/ML IV SOLN: 1.0000 mg | INTRAVENOUS | Status: DC | PRN

## 2018-07-26 MED ORDER — HYDROMORPHONE HCL 1 MG/ML IJ SOLN: 0.2000 mg | INTRAMUSCULAR | Status: DC | PRN

## 2018-07-26 MED ORDER — BUPIVACAINE ON-Q PAIN PUMP (FOR ORDER SET NO CHG)
INJECTION | Status: AC
  Filled 2018-07-26: qty 1

## 2018-07-26 MED ORDER — METOPROLOL TARTRATE 5 MG/5ML IV SOLN
INTRAVENOUS | Status: DC | PRN
  Administered 2018-07-26: 2.5 mg via INTRAVENOUS

## 2018-07-26 MED ORDER — MENTHOL 3 MG MT LOZG
1.0000 | LOZENGE | OROMUCOSAL | Status: DC | PRN
  Filled 2018-07-26: qty 9

## 2018-07-26 MED ORDER — HYDROCODONE-ACETAMINOPHEN 5-325 MG PO TABS: 1.0000 | ORAL_TABLET | ORAL | Status: DC | PRN

## 2018-07-26 MED ORDER — DIPHENHYDRAMINE HCL 25 MG PO CAPS: 25.0000 mg | ORAL_CAPSULE | Freq: Four times a day (QID) | ORAL | Status: DC | PRN

## 2018-07-26 MED ORDER — KETOROLAC TROMETHAMINE 15 MG/ML IJ SOLN
15.0000 mg | Freq: Three times a day (TID) | INTRAMUSCULAR | Status: AC | PRN
  Filled 2018-07-26: qty 1

## 2018-07-26 MED ORDER — ALBUTEROL SULFATE (2.5 MG/3ML) 0.083% IN NEBU: 3.0000 mL | INHALATION_SOLUTION | Freq: Four times a day (QID) | RESPIRATORY_TRACT | Status: DC | PRN

## 2018-07-26 MED ORDER — WITCH HAZEL-GLYCERIN EX PADS: 1.0000 "application " | MEDICATED_PAD | CUTANEOUS | Status: DC | PRN

## 2018-07-26 MED ORDER — SIMETHICONE 80 MG PO CHEW
80.0000 mg | CHEWABLE_TABLET | Freq: Three times a day (TID) | ORAL | Status: DC
  Administered 2018-07-26 – 2018-07-29 (×12): 80 mg via ORAL
  Filled 2018-07-26 (×12): qty 1

## 2018-07-26 MED ORDER — FERROUS SULFATE 325 (65 FE) MG PO TABS
325.0000 mg | ORAL_TABLET | Freq: Two times a day (BID) | ORAL | Status: DC
  Administered 2018-07-26 – 2018-07-29 (×8): 325 mg via ORAL
  Filled 2018-07-26 (×8): qty 1

## 2018-07-26 MED ORDER — ACETAMINOPHEN 500 MG PO TABS
1000.0000 mg | ORAL_TABLET | Freq: Four times a day (QID) | ORAL | Status: DC
  Administered 2018-07-26 – 2018-07-29 (×13): 1000 mg via ORAL
  Filled 2018-07-26 (×15): qty 2

## 2018-07-26 MED ORDER — KETAMINE HCL 50 MG/ML IJ SOLN
INTRAMUSCULAR | Status: DC | PRN
  Administered 2018-07-26 (×6): 25 mg via INTRAMUSCULAR

## 2018-07-26 MED ORDER — KETOROLAC TROMETHAMINE 30 MG/ML IJ SOLN
INTRAMUSCULAR | Status: AC
  Administered 2018-07-26: 30 mg
  Filled 2018-07-26: qty 1

## 2018-07-26 MED ORDER — PRENATAL MULTIVITAMIN CH
1.0000 | ORAL_TABLET | Freq: Every day | ORAL | Status: DC
  Administered 2018-07-26 – 2018-07-29 (×4): 1 via ORAL
  Filled 2018-07-26 (×4): qty 1

## 2018-07-26 MED ORDER — LIDOCAINE 2% (20 MG/ML) 5 ML SYRINGE
INTRAMUSCULAR | Status: DC | PRN
  Administered 2018-07-26 (×3): 60 mg via INTRAVENOUS

## 2018-07-26 MED ORDER — DIBUCAINE 1 % RE OINT: 1.0000 "application " | TOPICAL_OINTMENT | RECTAL | Status: DC | PRN

## 2018-07-26 MED ORDER — FLEET ENEMA 7-19 GM/118ML RE ENEM: 1.0000 | ENEMA | Freq: Every day | RECTAL | Status: DC | PRN

## 2018-07-26 MED ORDER — BUPIVACAINE HCL (PF) 0.5 % IJ SOLN
INTRAMUSCULAR | Status: DC | PRN
  Administered 2018-07-26: 10 mL

## 2018-07-26 MED ORDER — IBUPROFEN 800 MG PO TABS
800.0000 mg | ORAL_TABLET | Freq: Three times a day (TID) | ORAL | Status: AC
  Administered 2018-07-26 – 2018-07-28 (×8): 800 mg via ORAL
  Filled 2018-07-26 (×9): qty 1

## 2018-07-26 MED ORDER — CEFAZOLIN SODIUM-DEXTROSE 2-3 GM-%(50ML) IV SOLR
INTRAVENOUS | Status: DC | PRN
  Administered 2018-07-26: 2 g via INTRAVENOUS

## 2018-07-26 MED ORDER — SIMETHICONE 80 MG PO CHEW
80.0000 mg | CHEWABLE_TABLET | ORAL | Status: DC
  Administered 2018-07-26 – 2018-07-29 (×3): 80 mg via ORAL
  Filled 2018-07-26 (×3): qty 1

## 2018-07-26 MED ORDER — SIMETHICONE 80 MG PO CHEW
80.0000 mg | CHEWABLE_TABLET | ORAL | Status: DC | PRN
  Filled 2018-07-26: qty 1

## 2018-07-26 MED ORDER — OXYTOCIN 40 UNITS IN NORMAL SALINE INFUSION - SIMPLE MED
2.5000 [IU]/h | INTRAVENOUS | Status: AC
  Administered 2018-07-26: 2.5 [IU]/h via INTRAVENOUS

## 2018-07-26 MED ORDER — ONDANSETRON HCL 4 MG/2ML IJ SOLN
INTRAMUSCULAR | Status: DC | PRN
  Administered 2018-07-26: 4 mg via INTRAVENOUS

## 2018-07-26 MED ORDER — OXYTOCIN 40 UNITS IN NORMAL SALINE INFUSION - SIMPLE MED
INTRAVENOUS | Status: AC
  Filled 2018-07-26: qty 1000

## 2018-07-26 MED ORDER — FENTANYL CITRATE (PF) 100 MCG/2ML IJ SOLN
INTRAMUSCULAR | Status: DC | PRN
  Administered 2018-07-26 (×2): 50 ug via INTRAVENOUS

## 2018-07-26 MED ORDER — LACTATED RINGERS IV SOLN: INTRAVENOUS | Status: DC

## 2018-07-26 MED ORDER — COCONUT OIL OIL: 1.0000 "application " | TOPICAL_OIL | Status: DC | PRN

## 2018-07-26 NOTE — Anesthesia Postprocedure Evaluation (Signed)
Anesthesia Post Note  Patient: Shelby Aguilar  Procedure(s) Performed: CESAREAN SECTION (N/A Abdomen)  Patient location during evaluation: Mother Baby Anesthesia Type: Epidural Level of consciousness: awake and alert Pain management: pain level controlled Vital Signs Assessment: post-procedure vital signs reviewed and stable Respiratory status: spontaneous breathing, nonlabored ventilation and respiratory function stable Cardiovascular status: stable Postop Assessment: no headache, no backache and epidural receding Anesthetic complications: no     Last Vitals:  Vitals:   07/26/18 0631 07/26/18 0818  BP: (!) 106/43 100/61  Pulse: 96 91  Resp: 18 18  Temp: 36.9 C (!) 36.3 C  SpO2: 97% 99%    Last Pain:  Vitals:   07/26/18 0818  TempSrc: Oral  PainSc:                  Rica Mast

## 2018-07-26 NOTE — Op Note (Signed)
Cesarean Section Procedure Note 07/26/18  Pre-operative Diagnosis: 1. Arrest of Dilation 2. Fetal intolerance of labor 3. Failed TOLAC Post-operative Diagnosis: same, delivered. Procedure:  Repeat Low Transverse Cesarean Section Surgeon: Adelene Idler MD   Anesthesia: Epidural Estimated Blood Loss: 1233 cc Complications: None; patient tolerated the procedure well.   Disposition: PACU - hemodynamically stable. Condition: stable   Findings: A female infant in the cephalic presentation. Amniotic fluid - meconium Birth weight: 9 lbs 9oz Apgars of 5 and 9.  Intact placenta with a three-vessel cord. Grossly normal uterus, tubes and ovaries bilaterally. No intraabdominal adhesions were noted.  Situation: TOLAC not successful, patient advanced to 7cm but had a swollen and edematous cervix. Did not make cervical change for more than 3 hours. Urgent cesarean performed.   Procedure Details    The patient was taken to operating room, identified as the correct patient and the procedure verified as C-Section Delivery. A time out was held and the above information confirmed. After induction of anesthesia, the patient was draped and prepped in the usual sterile manner. A Pfannenstiel incision was made and carried down through the subcutaneous tissue to the fascia. Fascial incision was made and extended transversely with the Mayo scissors. The fascia was separated from the underlying rectus tissue superiorly and inferiorly. The peritoneum was identified and entered bluntly. Peritoneal incision was extended longitudinally. A low transverse hysterotomy was made. The fetus was delivered atraumatically. The umbilical cord was clamped x2 and cut and the infant was handed to the awaiting pediatricians. The placenta was removed intact and appeared normal with a 3-vessel cord. The uterus was exteriorized and cleared of all clot and debris. The hysterotomy was closed with running sutures of 0 Vicryl suture. A  second imbricating layer was placed with the same suture. Excellent hemostasis was observed. The uterus was returned to the abdomen. The pelvis was irrigated and again, excellent hemostasis was noted. The On Q Pain pump System was then placed.  Trocars were placed through the abdominal wall into the subfascial space and these were used to thread the silver soaker cathaters into place.The rectus muscles were inspected and were hemostatic. The rectus fascia was then reapproximated with running sutures of 0-vicryl, with careful placement not to incorporate the cathaters. Subcutaneous tissues are then irrigated with saline and hemostasis assured with the bovie. The subcutaneous fat was approximated with 3-0 plain and a running stitch.  Skin was then closed with 4-0 monocryl suture in a subcuticular fashion followed by skin adhesive. The cathaters are flushed each with 5 mL of Bupivicaine and stabilized into place with dressing. Instrument, sponge, and needle counts were correct prior to the abdominal closure and at the conclusion of the case.  The patient tolerated the procedure well and was transferred to the recovery room in stable condition.   Natale Milch MD [facility], [facility] 07/26/18 2:04 AM

## 2018-07-26 NOTE — Lactation Note (Signed)
This note was copied from a baby's chart. Lactation Consultation Note  Patient Name: Shelby Aguilar Today's Date: 07/26/2018     Maternal Data    Feeding Feeding Type: Bottle Fed - Breast Milk Nipple Type: Slow - flow  LATCH Score                   Interventions    Lactation Tools Discussed/Used     Consult Status  LC set mom up with a DEBP to use while separated from baby in SCN. Mom breastfeed excl. For 80mos with now 33yo and has used a DEBP in the past. Mom states that baby keeps tongue up during feedings and is using a 51mm nipple shield to keep his tongue down while at the breast. LC fitted mom for 35mm flanges for pumping.     Burnadette Peter 07/26/2018, 11:20 AM

## 2018-07-26 NOTE — Discharge Summary (Addendum)
OB Discharge Summary     Patient Name: Shelby Aguilar DOB: April 24, 1986 MRN: [id number]  Date of admission: 07/25/2018 Delivering MD: Natale Milchhristanna R Schuman, MD  Date of Delivery: 07/25/2018  Date of discharge: 07/29/2018  Admitting diagnosis: 41 weeks;contractions Intrauterine pregnancy: 5813w0d     Secondary diagnosis: TOLAC, Repeat cesarean section failed TOLAC, LGA infant     Discharge diagnosis: Term Pregnancy Delivered, Reasons for cesarean section  Arrest of Dilation and Non-reassuring FHR                         Hospital course:  Onset of Labor With Unplanned C/S  33 y.o. yo [id number] at 5813w0d was admitted in Latent Labor on 07/25/2018. Patient had a labor course significant for AROM with meconium, slow cervical change. Membrane Rupture Time/Date: 10:41 PM ,07/25/2018   The patient went for cesarean section due to Arrest of Dilation and Non-Reassuring FHR, and delivered a Viable infant,07/26/2018  Details of operation can be found in separate operative note. Patient had an uncomplicated postpartum course.  She is ambulating,tolerating a regular diet, passing flatus, and urinating well.  Patient is discharged home in stable condition 07/29/18.                                                                 Post partum procedures:none  Complications: None  Physical exam on 07/29/2018: Vitals:   07/28/18 0755 07/28/18 2343 07/29/18 0821 07/29/18 1611  BP: 103/61 118/70 118/74 104/75  Pulse: 79 80 75 79  Resp: 20 18 20 20   Temp: 97.9 F (36.6 C) 97.8 F (36.6 C) 98 F (36.7 C) 98 F (36.7 C)  TempSrc: Oral Oral Oral   SpO2: 99% 99% 98% 99%  Weight:      Height:       General: alert, cooperative and no distress Lochia: appropriate Uterine Fundus: firm Incision: Healing well with no significant drainage, No significant erythema, Dressing is clean, dry, and intact DVT Evaluation: No evidence of DVT seen on physical exam. No cords or calf tenderness. No significant calf/ankle  edema.  Labs: Lab Results  Component Value Date   WBC 14.1 (H) 07/27/2018   HGB 9.1 (L) 07/27/2018   HCT 28.6 (L) 07/27/2018   MCV 82.7 07/27/2018   PLT 156 07/27/2018   CMP Latest Ref Rng & Units 05/03/2007  Glucose 70 - 99 mg/dL 90  BUN 6 - 23 mg/dL 11  Creatinine 0.4 - 1.2 mg/dL 0.8  Sodium 147135 - 829145 meq/L 139  Potassium 3.5 - 5.1 meq/L 3.8  Chloride 96 - 112 meq/L 107  CO2 19 - 32 meq/L 25  Calcium 8.4 - 10.5 mg/dL 9.2  Total Protein 6.0 - 8.3 g/dL 6.8  Total Bilirubin 0.3 - 1.2 mg/dL 0.6  Alkaline Phos 39 - 117 units/L 59  AST 0 - 37 units/L 15  ALT 0 - 35 units/L 12    Discharge instruction: per After Visit Summary.  Medications:  Allergies as of 07/29/2018   No Known Allergies     Medication List    STOP taking these medications   DHA 200 MG Caps     TAKE these medications   albuterol 108 (90 Base) MCG/ACT inhaler Commonly known as:  PROVENTIL HFA;VENTOLIN HFA  Inhale 2 puffs into the lungs every 6 (six) hours as needed for wheezing or shortness of breath.   ferrous sulfate 325 (65 FE) MG tablet Take 325 mg by mouth daily with breakfast.   HYDROcodone-acetaminophen 5-325 MG tablet Commonly known as:  NORCO/VICODIN Take 1 tablet by mouth every 6 (six) hours as needed for moderate pain.   ibuprofen 800 MG tablet Commonly known as:  ADVIL,MOTRIN Take 1 tablet (800 mg total) by mouth every 8 (eight) hours.   multivitamin-prenatal 27-0.8 MG Tabs tablet Take 1 tablet by mouth daily.   norethindrone 0.35 MG tablet Commonly known as:  MICRONOR,CAMILA,ERRIN Take 1 tablet (0.35 mg total) by mouth daily.            Discharge Care Instructions  (From admission, onward)         Start     Ordered   07/29/18 0000  Discharge wound care:    Comments:  Perform wound care instructions   07/29/18 2109          Diet: routine diet  Activity: Advance as tolerated. Pelvic rest for 6 weeks.   Outpatient follow up: Follow-up Information    Schuman,  Jaquelyn Bitterhristanna R, MD. Schedule an appointment as soon as possible for a visit in 1 week.   Specialty:  Obstetrics and Gynecology Why:  For incision check Contact information: [address] [place] [address] [phone]             Postpartum contraception: Progesterone only pills Rhogam Given postpartum: no Rubella vaccine given postpartum: no Varicella vaccine given postpartum: no TDaP given antepartum or postpartum: Yes 05/31/2018 Influenza vaccine given: AP  Newborn Data: Live born female  Birth Weight: 9 lb 8.7 oz (4330 g) APGAR: 5, 9  Newborn Delivery   Birth date/time:  07/26/2018 00:54:00 Delivery type:  C-Section, Low Transverse Trial of labor:  Yes C-section categorization:  Repeat      Baby Feeding: formula   Disposition:NICU  SIGNED: Thomasene MohairStephen Blain Hunsucker, MD 07/29/2018 9:15 PM

## 2018-07-26 NOTE — Progress Notes (Signed)
Patient ID: Shelby Aguilar, female   DOB: 09/14/1985, 33 y.o.   MRN: [id number] Admit Date: 07/25/2018 Today's Date: 07/26/2018  Subjective: Postpartum Day 0: Cesarean Delivery Patient reports tolerating PO.    Objective: Vital signs in last 24 hours: Temp:  [97.9 F (36.6 C)-99.1 F (37.3 C)] 98 F (36.7 C) (02/05 0818) Pulse Rate:  [74-119] 91 (02/05 0818) Resp:  [10-26] 18 (02/05 0818) BP: (92-146)/(43-94) 100/61 (02/05 0818) SpO2:  [95 %-100 %] 99 % (02/05 0818) Weight:  [110 kg] 110 kg (02/04 1025)  Physical Exam:  General: alert, cooperative and appears stated age Lochia: appropriate Uterine Fundus: firm Incision: healing well, no significant drainage, no dehiscence, no significant erythema DVT Evaluation: No evidence of DVT seen on physical exam.  Recent Labs    07/25/18 1340 07/26/18 0755  HGB 12.1 9.9*  HCT 37.3 30.3*    Assessment/Plan: Status post Cesarean section. Doing well postoperatively.  Continue current care. Breast feeding support.  Acute blood loss anemia- Will repeat CBC tomorrow.  Encouraged rest and ambulation  Christanna R Schuman 07/26/2018, 10:08 AM

## 2018-07-26 NOTE — Lactation Note (Signed)
This note was copied from a baby's chart. Lactation Consultation Note  Patient Name: Shelby Aguilar [name] Date: 07/26/2018 Reason for consult: Follow-up assessment   Maternal Data    Feeding Feeding Type: Breast Fed  LATCH Score                   Interventions Interventions: (nipple shield)  Lactation Tools Discussed/Used Tools: Nipple Shields Nipple shield size: 24   Consult Status  LC assisted with positioning of infant in preparation for breastfeeding. Infant attempted to latch both with and without the nipple shield but seemed to have a hard time initiating the latch. Oral assessment of suck was completed with a gloved finger. Infant did not initially have a rhythmic suck but would "dart" his tongue back and forth. As nurse began to syringe feed him colostrum while doing the suck assessment, infant gradually improved with his suck. Plan is for infant to continue to practice breastfeeding using SNS and the nipple shield for structure that will allow infant to keep his tongue down and help him practice more with his suck while receiving a constant flow of colostrum.    Shelby Aguilar 07/26/2018, 6:38 PM

## 2018-07-26 NOTE — Anesthesia Post-op Follow-up Note (Signed)
Anesthesia QCDR form completed.        

## 2018-07-26 NOTE — Transfer of Care (Signed)
Immediate Anesthesia Transfer of Care Note  Patient: Shelby Aguilar  Procedure(s) Performed: CESAREAN SECTION (N/A Abdomen)  Patient Location: PACU  Anesthesia Type:Regional  Level of Consciousness: awake, alert , oriented and patient cooperative  Airway & Oxygen Therapy: Patient Spontanous Breathing  Post-op Assessment: Report given to RN and Post -op Vital signs reviewed and stable  Post vital signs: Reviewed and stable  Last Vitals:  Vitals Value Taken Time  BP    Temp    Pulse 104 07/26/2018  2:15 AM  Resp 15 07/26/2018  2:15 AM  SpO2 99 % 07/26/2018  2:15 AM  Vitals shown include unvalidated device data.  Last Pain:  Vitals:   07/25/18 2230  TempSrc:   PainSc: 0-No pain         Complications: No apparent anesthesia complications

## 2018-07-27 LAB — CBC
HCT: 28.6 % — ABNORMAL LOW (ref 36.0–46.0)
Hemoglobin: 9.1 g/dL — ABNORMAL LOW (ref 12.0–15.0)
MCH: 26.3 pg (ref 26.0–34.0)
MCHC: 31.8 g/dL (ref 30.0–36.0)
MCV: 82.7 fL (ref 80.0–100.0)
Platelets: 156 10*3/uL (ref 150–400)
RBC: 3.46 MIL/uL — ABNORMAL LOW (ref 3.87–5.11)
RDW: 15.9 % — ABNORMAL HIGH (ref 11.5–15.5)
WBC: 14.1 10*3/uL — ABNORMAL HIGH (ref 4.0–10.5)
nRBC: 0 % (ref 0.0–0.2)

## 2018-07-27 NOTE — Progress Notes (Signed)
POD#1 rLTCS Subjective:  Well-appearing and cheerful, has a large breakfast. She is ambulating and voiding without difficulty. She denies nausea. Her pain is well controlled. She is passing flatus.  Objective:  Blood pressure 109/63, pulse 77, temperature 97.9 F (36.6 C), temperature source Oral, resp. rate 18, height 5\' 3"  (1.6 m), weight 110 kg, last menstrual period 10/07/2017, SpO2 99 %, currently breastfeeding.  General: NAD Pulmonary: no increased work of breathing Abdomen: non-distended, non-tender, fundus firm  Incision: Dressing C/D/I Extremities: no signs of DVT  Results for orders placed or performed during the hospital encounter of 07/25/18 (from the past 72 hour(s))  CBC     Status: Abnormal   Collection Time: 07/25/18  1:40 PM  Result Value Ref Range   WBC 14.6 (H) 4.0 - 10.5 K/uL   RBC 4.60 3.87 - 5.11 MIL/uL   Hemoglobin 12.1 12.0 - 15.0 g/dL   HCT 16.1 09.6 - 04.5 %   MCV 81.1 80.0 - 100.0 fL   MCH 26.3 26.0 - 34.0 pg   MCHC 32.4 30.0 - 36.0 g/dL   RDW 40.9 (H) 81.1 - 91.4 %   Platelets 204 150 - 400 K/uL   nRBC 0.0 0.0 - 0.2 %    Comment: Performed at [facility], [address] [place], [place] [address]  Type and screen [facility]     Status: None   Collection Time: 07/25/18  1:40 PM  Result Value Ref Range   ABO/RH(D) A POS    Antibody Screen NEG    Sample Expiration      07/28/2018 Performed at [facility], [address] [place], [place] [address]   RPR     Status: None   Collection Time: 07/25/18  1:40 PM  Result Value Ref Range   RPR Ser Ql Non Reactive Non Reactive    Comment: (NOTE) Performed At: [facility] [address] [place], [place] [id number] Jolene Schimke MD EX:[id number]   CBC     Status: Abnormal   Collection Time: 07/26/18  7:55 AM  Result Value Ref Range   WBC 18.4 (H) 4.0 - 10.5 K/uL   RBC 3.74 (L) 3.87 - 5.11 MIL/uL   Hemoglobin 9.9 (L) 12.0 - 15.0 g/dL   HCT 10.2  (L) 72.5 - 46.0 %   MCV 81.0 80.0 - 100.0 fL   MCH 26.5 26.0 - 34.0 pg   MCHC 32.7 30.0 - 36.0 g/dL   RDW 36.6 44.0 - 34.7 %   Platelets 169 150 - 400 K/uL   nRBC 0.0 0.0 - 0.2 %    Comment: Performed at [facility], [address] [place], [place] [address]  ABO/Rh     Status: None   Collection Time: 07/26/18  7:55 AM  Result Value Ref Range   ABO/RH(D)      A POS Performed at [facility], [address] [place], [place] [address]   CBC     Status: Abnormal   Collection Time: 07/27/18  4:58 AM  Result Value Ref Range   WBC 14.1 (H) 4.0 - 10.5 K/uL   RBC 3.46 (L) 3.87 - 5.11 MIL/uL   Hemoglobin 9.1 (L) 12.0 - 15.0 g/dL   HCT 64.3 (L) 32.9 - 51.8 %   MCV 82.7 80.0 - 100.0 fL   MCH 26.3 26.0 - 34.0 pg   MCHC 31.8 30.0 - 36.0 g/dL   RDW 84.1 (H) 66.0 - 63.0 %   Platelets 156 150 -  400 K/uL   nRBC 0.0 0.0 - 0.2 %    Comment: Performed at [facility], [address] [place], [place] [address]     Assessment:   33 y.o. [phone] post-operative day # 1 in good condition.  Plan:  1) Acute blood loss anemia - hemodynamically stable and asymptomatic - PO ferrous sulfate and prenatal vitamins  2) Blood Type --/--/A POS Performed at [facility], [address] [place], [place] [address]  [phone]) / Ishmael Holter 5.43 (07/15 1428) / Varicella Immune  3) TDAP status: received antepartum 05/31/2018  4) Breast feeding / Contraception: plans progesterone only pill  5) Disposition: continue postpartum care.   Marcelyn Bruins, CNM 07/27/2018

## 2018-07-28 LAB — SURGICAL PATHOLOGY

## 2018-07-28 NOTE — Lactation Note (Signed)
This note was copied from a baby's chart. Lactation Consultation Note  Patient Name: Shelby Aguilar [id number] Date: 07/28/2018 Reason for consult: Follow-up assessment;NICU baby   Maternal Data Formula Feeding for Exclusion: No Does the patient have breastfeeding experience prior to this delivery?: Yes Mom continues to attempt latching baby to breast, pumps 30 cc EBM at last pumping Feeding Feeding Type: Bottle Fed - Formula Nipple Type: Slow - flow BAby sleepy with disorganized suck LATCH Score Latch: Too sleepy or reluctant, no latch achieved, no sucking elicited.                 Interventions    Lactation Tools Discussed/Used WIC Program: No   Consult Status Consult Status: Follow-up Date: 07/29/18 Follow-up type: In-patient    Shelby Aguilar 07/28/2018, 4:41 PM

## 2018-07-29 MED ORDER — NORETHINDRONE 0.35 MG PO TABS: 1.0000 | ORAL_TABLET | Freq: Every day | ORAL | 11 refills | Status: DC

## 2018-07-29 MED ORDER — IBUPROFEN 800 MG PO TABS: 800.0000 mg | ORAL_TABLET | Freq: Three times a day (TID) | ORAL | 0 refills | Status: DC

## 2018-07-29 MED ORDER — HYDROCODONE-ACETAMINOPHEN 5-325 MG PO TABS: 1.0000 | ORAL_TABLET | Freq: Four times a day (QID) | ORAL | 0 refills | Status: DC | PRN

## 2018-07-29 NOTE — Progress Notes (Signed)
All discharge instructions given to patient and she voices understanding of all instructions given. On Q pump removed, minimal bleeding noted and had stopped by time patient left and clean bandaid replaced. She knows to call Monday and schedule a f/u appt for a week from now

## 2018-07-29 NOTE — Progress Notes (Signed)
Pt has been resting comfortably this shift with minimal pain 

## 2018-07-29 NOTE — Discharge Instructions (Signed)
Follow up sooner with fever greater than 100.5, problems breathing, pain not helped by medications, severe depression ( more than just baby blues, wanting to hurt yourself or the baby), severe bleeding( saturating more than one pad an hour or large palm sized clots),or any incisional concerns such as increased redness, swelling, discharge or increased pain in incision.  No heavy lifting for 6 weeks.  No driving while taking narcotics. No douches, intercourse, tampons, or enemas for 6 weeks. Cesarean Delivery Cesarean birth, or cesarean delivery, is the surgical delivery of a baby through an incision in the abdomen and the uterus. This may be referred to as a C-section. This procedure may be scheduled ahead of time, or it may be done in an emergency situation. Tell a health care provider about:  Any allergies you have.  All medicines you are taking, including vitamins, herbs, eye drops, creams, and over-the-counter medicines.  Any problems you or family members have had with anesthetic medicines.  Any blood disorders you have.  Any surgeries you have had.  Any medical conditions you have.  Whether you or any members of your family have a history of deep vein thrombosis (DVT) or pulmonary embolism (PE). What are the risks? Generally, this is a safe procedure. However, problems may occur, including:  Infection.  Bleeding.  Allergic reactions to medicines.  Damage to other structures or organs.  Blood clots.  Injury to your baby. What happens before the procedure? General instructions  Follow instructions from your health care provider about eating or drinking restrictions.  If you know that you are going to have a cesarean delivery, do not shave your pubic area. Shaving before the procedure may increase your risk of infection.  Plan to have someone take you home from the hospital.  Ask your health care provider what steps will be taken to prevent infection. These may  include: ? Removing hair at the surgery site. ? Washing skin with a germ-killing soap. ? Taking antibiotic medicine.  Depending on the reason for your cesarean delivery, you may have a physical exam or additional testing, such as an ultrasound.  You may have your blood or urine tested. Questions for your health care provider  Ask your health care provider about: ? Changing or stopping your regular medicines. This is especially important if you are taking diabetes medicines or blood thinners. ? Your pain management plan. This is especially important if you plan to breastfeed your baby. ? How long you will be in the hospital after the procedure. ? Any concerns you may have about receiving blood products, if you need them during the procedure. ? Cord blood banking, if you plan to collect your baby's umbilical cord blood.  You may also want to ask your health care provider: ? Whether you will be able to hold or breastfeed your baby while you are still in the operating room. ? Whether your baby can stay with you immediately after the procedure and during your recovery. ? Whether a family member or a person of your choice can go with you into the operating room and stay with you during the procedure, immediately after the procedure, and during your recovery. What happens during the procedure?   An IV will be inserted into one of your veins.  Fluid and medicines, such as antibiotics, will be given before the surgery.  Fetal monitors will be placed on your abdomen to check your baby's heart rate.  You may be given a special warming gown to wear  to keep your temperature stable.  A catheter may be inserted into your bladder through your urethra. This drains your urine during the procedure.  You may be given one or more of the following: ? A medicine to numb the area (local anesthetic). ? A medicine to make you fall asleep (general anesthetic). ? A medicine (regional anesthetic) that is  injected into your back or through a small thin tube placed in your back (spinal anesthetic or epidural anesthetic). This numbs everything below the injection site and allows you to stay awake during your procedure. If this makes you feel nauseous, tell your health care provider. Medicines will be available to help reduce any nausea you may feel.  An incision will be made in your abdomen, and then in your uterus.  If you are awake during your procedure, you may feel tugging and pulling in your abdomen, but you should not feel pain. If you feel pain, tell your health care provider immediately.  Your baby will be removed from your uterus. You may feel more pressure or pushing while this happens.  Immediately after birth, your baby will be dried and kept warm. You may be able to hold and breastfeed your baby.  The umbilical cord may be clamped and cut during this time. This usually occurs after waiting a period of 1-2 minutes after delivery.  Your placenta will be removed from your uterus.  Your incisions will be closed with stitches (sutures). Staples, skin glue, or adhesive strips may also be applied to the incision in your abdomen.  Bandages (dressings) may be placed over the incision in your abdomen. The procedure may vary among health care providers and hospitals. What happens after the procedure?  Your blood pressure, heart rate, breathing rate, and blood oxygen level will be monitored until you are discharged from the hospital.  You may continue to receive fluids and medicines through an IV.  You will have some pain. Medicines will be available to help control your pain.  To help prevent blood clots: ? You may be given medicines. ? You may have to wear compression stockings or devices. ? You will be encouraged to walk around when you are able.  Hospital staff will encourage and support bonding with your baby. Your hospital may have you and your baby to stay in the same room  (rooming in) during your hospital stay to encourage successful bonding and breastfeeding.  You may be encouraged to cough and breathe deeply often. This helps to prevent lung problems.  If you have a catheter draining your urine, it will be removed as soon as possible after your procedure. Summary  Cesarean birth, or cesarean delivery, is the surgical delivery of a baby through an incision in the abdomen and the uterus.  Follow instructions from your health care provider about eating or drinking restrictions before the procedure.  You will have some pain after the procedure. Medicines will be available to help control your pain.  Hospital staff will encourage and support bonding with your baby after the procedure. Your hospital may have you and your baby to stay in the same room (rooming in) during your hospital stay to encourage successful bonding and breastfeeding. This information is not intended to replace advice given to you by your health care provider. Make sure you discuss any questions you have with your health care provider. Document Released: 06/07/2005 Document Revised: 12/12/2017 Document Reviewed: 12/12/2017 Elsevier Interactive Patient Education  2019 [employer].

## 2018-08-02 ENCOUNTER — Ambulatory Visit: Payer: Managed Care, Other (non HMO) | Admitting: Obstetrics and Gynecology

## 2018-08-02 ENCOUNTER — Encounter: Payer: Self-pay | Admitting: Obstetrics and Gynecology

## 2018-08-02 ENCOUNTER — Ambulatory Visit (INDEPENDENT_AMBULATORY_CARE_PROVIDER_SITE_OTHER): Payer: Managed Care, Other (non HMO) | Admitting: Obstetrics and Gynecology

## 2018-08-02 VITALS — BP 112/70 | HR 72 | Ht 63.0 in | Wt 226.0 lb

## 2018-08-02 DIAGNOSIS — Z30011 Encounter for initial prescription of contraceptive pills: Secondary | ICD-10-CM

## 2018-08-02 MED ORDER — DROSPIRENONE 4 MG PO TABS: 1.0000 | ORAL_TABLET | Freq: Every day | ORAL | 11 refills | Status: DC

## 2018-08-02 NOTE — Progress Notes (Signed)
  OBSTETRICS POSTPARTUM CLINIC PROGRESS NOTE  Subjective:     Shelby Aguilar is a 33 y.o. G64P2002 female who presents for a postpartum visit. She is 1 week postpartum following a Term pregnancy and delivery by Cesarean section, failed TOLAC.  I have fully reviewed the prenatal and intrapartum course. Anesthesia: epidural.  Postpartum course has been complicated by uncomplicated.  Baby is feeding by Breast.  Bleeding: patient has not  resumed menses.  Bowel function is normal. Bladder function is normal.  Patient is not sexually active. Contraception method desired is oral progesterone-only contraceptive.  Postpartum depression screening: negative.   The following portions of the patient's history were reviewed and updated as appropriate: allergies, current medications, past family history, past medical history, past social history, past surgical history and problem list.  Review of Systems Pertinent items are noted in HPI.  Objective:    BP 112/70   Pulse 72   Ht 5\' 3"  (1.6 m)   Wt 226 lb (102.5 kg)   LMP 10/07/2017 (Exact Date)   Breastfeeding Yes   BMI 40.03 kg/m   General:  alert and no distress   Breasts:  inspection negative, no nipple discharge or bleeding, no masses or nodularity palpable  Lungs: clear to auscultation bilaterally  Heart:  regular rate and rhythm, S1, S2 normal, no murmur, click, rub or gallop  Abdomen: soft, non-tender; bowel sounds normal; no masses,  no organomegaly.   Well healed Pfannenstiel incision   Vulva:  normal  Vagina: normal vagina, no discharge, exudate, lesion, or erythema  Cervix:  no cervical motion tenderness and no lesions  Corpus: normal size, contour, position, consistency, mobility, non-tender  Adnexa:  normal adnexa and no mass, fullness, tenderness  Rectal Exam: Not performed.          Assessment:  Post Partum Care visit There are no diagnoses linked to this encounter.  Plan:  See orders and Patient  Instructions Follow up in: 5 weeks or as needed.   Adelene Idler MD [facility], [facility] 08/02/2018 12:04 PM

## 2018-08-08 ENCOUNTER — Telehealth: Payer: Self-pay

## 2018-08-08 NOTE — Telephone Encounter (Signed)
Pt calling for return to work note to return Mon., 08/14/18.  Would like to p/u tomorrow if possible.  [phone].

## 2018-08-08 NOTE — Telephone Encounter (Signed)
FMLA/DISABILITY form for [employer] filled out, signature obtained and given to [place] for processing. 

## 2018-08-09 NOTE — Telephone Encounter (Signed)
Please advise. Pt delivered 07/26/18 by C-section. Thank you

## 2018-08-10 NOTE — Telephone Encounter (Signed)
Called patient and left message. She was doing well at visit, but would like to know what type of work she would be returning too. Thank you, Dr. Jerene Pitch

## 2018-08-11 NOTE — Telephone Encounter (Signed)
Pt was confused about this message. She stated that she is not going back to work until 10/09/18, she needs a note from her provider to be out of work for the amount of time she is allowed. Pt will pick up the note whenever it is ready. Thank you.

## 2018-08-11 NOTE — Addendum Note (Signed)
Addended by: Loran Senters D on: 08/11/2018 12:15 PM   Modules accepted: Level of Service, SmartSet

## 2018-08-11 NOTE — Telephone Encounter (Signed)
This encounter was created in error - please disregard.

## 2018-08-11 NOTE — Telephone Encounter (Signed)
Left detailed msg letter is up front for her to p/u.

## 2018-08-28 ENCOUNTER — Ambulatory Visit: Payer: Self-pay

## 2018-08-28 NOTE — Lactation Note (Signed)
This note was copied from a baby's chart. Lactation Consultation Note  Patient Name: Raidyn Villada Today's Date: 08/28/2018     Maternal Data  MOB has been exclusively pumping for, 4 weeks and getting 15oz from both breasts at each session.   Feeding  Baby was not able to stay on nipple shield due to potential lip and tongue restriction. LC instructed mom to bottle feed baby expressed milk and he took 5oz in . Using a slow-flow nipple.  LATCH Score  0                 Interventions   nipple shield, DEBP, slow flow bottle  Lactation Tools Discussed/Used  nipple shield, DEBP, slow flow bottle   Consult Status  Mom has been referred to pediatric dentist in [place] for evaluation for a lip and tongue restriction. Baby leaks when using a bottle, clicks on breast, bottle and pacifier, cannot maintain seal, is very gassy and mother complains of extreme pain when trying to latch onto mom's breasts. Baby also curls top lip in while on breasts/bottle.  LC has instructed mom to pump q3-4hrs until seen by pediatric dentist. If a frenectomy needs to be done, patient is to come back for a f/u lactation consult in 1wk post-op.    Burnadette Peter 08/28/2018, 11:45 AM

## 2018-09-01 ENCOUNTER — Ambulatory Visit: Payer: Self-pay

## 2018-09-01 NOTE — Lactation Note (Signed)
This note was copied from a baby's chart. Lactation Consultation Note  Patient Name: Shelby Aguilar Today's Date: 09/01/2018     Maternal Data  Mother is continuing to exclusively pump and is getting large amounts of breast milk each session.  Feeding  LC attempted to assist with breastfeeding, but infant screamed at every attempt to put him in position to feed. Mother tried the football, position, cradle and laid back positions with no success. Nipple shield was used but Infant would not suck without screaming and being extremely fussy.  LATCH Score  0                 Interventions  Nipple shield, DEBP, slow flow bottle  Lactation Tools Discussed/Used     Consult Status  Mom and infant are here today for a feeding assessment post-frenectomy. Infant is not tolerating breastfeeding no matter what position he is placed. LC attempted to assess infant's suck but he would suckle one or two sucks and would cry and get extremely fussy. Mother states that she is performing the post op mouth exercises on infant's top lip and under infant's tongue as instructed. Mother states that she feels the procedure was traumatic for infant and was told by the [facility] dentist that the procedure took longer than normal due to infant "fighting." LC explained to mother that infant may have developed an oral aversion after the procedure and suggested that mother do lots of skin to skin and take baths with infant to get him more comfortable with being back at the breast until he is ready to breastfeed again. Mother states that she is ok with this plan and will call the lactation clinic back for breastfeeding assistance once she feels infant is ready to try breastfeeding again.     Arlyss Gandy 09/01/2018, 6:33 PM

## 2018-09-07 ENCOUNTER — Other Ambulatory Visit: Payer: Self-pay

## 2018-09-07 ENCOUNTER — Ambulatory Visit (INDEPENDENT_AMBULATORY_CARE_PROVIDER_SITE_OTHER): Payer: Managed Care, Other (non HMO) | Admitting: Obstetrics and Gynecology

## 2018-09-07 ENCOUNTER — Encounter: Payer: Self-pay | Admitting: Obstetrics and Gynecology

## 2018-09-07 DIAGNOSIS — Z1389 Encounter for screening for other disorder: Secondary | ICD-10-CM

## 2018-09-07 DIAGNOSIS — Z9189 Other specified personal risk factors, not elsewhere classified: Secondary | ICD-10-CM

## 2018-09-07 NOTE — Progress Notes (Signed)
  OBSTETRICS POSTPARTUM CLINIC PROGRESS NOTE  Subjective:     Shelby Aguilar is a 33 y.o. G71P2002 female who presents for a postpartum visit. She is 6 weeks postpartum following a Term pregnancy and delivery by C-section failure to progress.  I have fully reviewed the prenatal and intrapartum course. Anesthesia: epidural.  Postpartum course has been complicated by uncomplicated.  Baby is feeding by Breast.  Bleeding: patient has not  resumed menses.  Bowel function is normal. Bladder function is normal.  Patient is not sexually active. Contraception method desired is oral progesterone-only contraceptive.  Postpartum depression screening: positive. [place] 10.  The following portions of the patient's history were reviewed and updated as appropriate: allergies, current medications, past family history, past medical history, past social history, past surgical history and problem list.  Review of Systems Pertinent items are noted in HPI.  Objective:    BP 120/70   Pulse 78   Ht 5\' 3"  (1.6 m)   Wt 214 lb (97.1 kg)   LMP 10/07/2017 (Exact Date)   Breastfeeding Yes   BMI 37.91 kg/m   General:  alert and no distress   Breasts:  inspection negative, no nipple discharge or bleeding, no masses or nodularity palpable  Lungs: clear to auscultation bilaterally  Heart:  regular rate and rhythm, S1, S2 normal, no murmur, click, rub or gallop  Abdomen: soft, non-tender; bowel sounds normal; no masses,  no organomegaly.   Well healed Pfannenstiel incision                            Assessment:  Post Partum Care visit There are no diagnoses linked to this encounter.  Plan:  See orders and Patient Instructions  Given note to extend FMLA to 8 weeks, patient having significant issues with infant feeding at home because of tongue-tieing procedure.  Positive depression screen, but she reports that her mood has been good. Declines treatment. Denies suicidal ideation or fetal ideation.    Follow up in: 1 year or as needed.   Adelene Idler MD [facility], [facility] 09/07/2018 10:39 AM

## 2018-09-14 ENCOUNTER — Other Ambulatory Visit: Payer: Self-pay | Admitting: Obstetrics and Gynecology

## 2018-09-14 NOTE — Telephone Encounter (Signed)
CS patient

## 2018-09-14 NOTE — Telephone Encounter (Signed)
advise

## 2018-09-22 ENCOUNTER — Other Ambulatory Visit: Payer: Self-pay | Admitting: Obstetrics and Gynecology

## 2018-09-22 DIAGNOSIS — T148XXA Other injury of unspecified body region, initial encounter: Principal | ICD-10-CM

## 2018-09-22 DIAGNOSIS — L089 Local infection of the skin and subcutaneous tissue, unspecified: Secondary | ICD-10-CM

## 2018-09-22 MED ORDER — SULFAMETHOXAZOLE-TRIMETHOPRIM 800-160 MG PO TABS: 1.0000 | ORAL_TABLET | Freq: Two times a day (BID) | ORAL | 0 refills | Status: AC

## 2018-10-04 ENCOUNTER — Encounter: Payer: Self-pay | Admitting: Obstetrics and Gynecology

## 2018-10-04 ENCOUNTER — Other Ambulatory Visit: Payer: Self-pay

## 2018-10-04 ENCOUNTER — Ambulatory Visit (INDEPENDENT_AMBULATORY_CARE_PROVIDER_SITE_OTHER): Payer: Managed Care, Other (non HMO) | Admitting: Obstetrics and Gynecology

## 2018-10-04 VITALS — BP 118/70 | HR 91 | Ht 63.0 in | Wt 212.0 lb

## 2018-10-04 DIAGNOSIS — L089 Local infection of the skin and subcutaneous tissue, unspecified: Secondary | ICD-10-CM | POA: Diagnosis not present

## 2018-10-04 DIAGNOSIS — T148XXA Other injury of unspecified body region, initial encounter: Principal | ICD-10-CM

## 2018-10-04 NOTE — Progress Notes (Signed)
Patient ID: Shelby BeechBrittany M Saulnier, female   DOB: 06/14/1986, 33 y.o.   MRN: [id number]  Reason for Consult: Postpartum Care (Incision check, bubble on left side, some itching and some throbbing off and on)   Referred by [place], [facility]  Subjective:     HPI:  Shelby Aguilar is a 33 y.o. female. She is 8 weeks postpartum for a cesarean section. She has been doing well. Last week she noticed a small purulent bump at her incision line. It drained purulent fluid. She sent me a Building services engineerphoto and a [employer] message. I sent her antibiotics and discussed cleaning of the incision daily with hydrogen peroxide.    Past Medical History:  Diagnosis Date  . Asthma   . PCOS (polycystic ovarian syndrome)    Family History  Problem Relation Age of Onset  . Breast cancer Paternal Grandmother [id number]   Past Surgical History:  Procedure Laterality Date  . CESAREAN SECTION  2013  . CESAREAN SECTION N/A 07/25/2018   Procedure: CESAREAN SECTION;  Surgeon: Natale MilchSchuman,  R, MD;  Location: [facility] ORS;  Service: Obstetrics;  Laterality: N/A;    Short Social History:  Social History   Tobacco Use  . Smoking status: Never Smoker  . Smokeless tobacco: Never Used  Substance Use Topics  . Alcohol use: Yes    No Known Allergies  Current Outpatient Medications  Medication Sig Dispense Refill  . norethindrone (MICRONOR,CAMILA,ERRIN) 0.35 MG tablet TAKE 1 TABLET BY MOUTH EVERY DAY 28 tablet 11  . sulfamethoxazole-trimethoprim (BACTRIM DS,SEPTRA DS) 800-160 MG tablet Take 1 tablet by mouth 2 (two) times daily for 14 days. 28 tablet 0  . albuterol (PROVENTIL HFA;VENTOLIN HFA) 108 (90 Base) MCG/ACT inhaler Inhale 2 puffs into the lungs every 6 (six) hours as needed for wheezing or shortness of breath.    . Drospirenone (SLYND) 4 MG TABS Take 1 tablet by mouth daily. (Patient not taking: Reported on 09/07/2018) 28 tablet 11  . ferrous sulfate 325 (65 FE) MG tablet Take 325 mg by mouth daily with breakfast.     . HYDROcodone-acetaminophen (NORCO/VICODIN) 5-325 MG tablet Take 1 tablet by mouth every 6 (six) hours as needed for moderate pain. (Patient not taking: Reported on 08/02/2018) 20 tablet 0  . ibuprofen (ADVIL,MOTRIN) 800 MG tablet Take 1 tablet (800 mg total) by mouth every 8 (eight) hours. (Patient not taking: Reported on 08/02/2018) 30 tablet 0  . Prenatal Vit-Fe Fumarate-FA (MULTIVITAMIN-PRENATAL) 27-0.8 MG TABS tablet Take 1 tablet by mouth daily.      No current facility-administered medications for this visit.     Review of Systems  Constitutional: Negative for chills, fatigue, fever and unexpected weight change.  HENT: Negative for trouble swallowing.  Eyes: Negative for loss of vision.  Respiratory: Negative for cough, shortness of breath and wheezing.  Cardiovascular: Negative for chest pain, leg swelling, palpitations and syncope.  GI: Negative for abdominal pain, blood in stool, diarrhea, nausea and vomiting.  GU: Negative for difficulty urinating, dysuria, frequency and hematuria.  Musculoskeletal: Negative for back pain, leg pain and joint pain.  Skin: Negative for rash.  Neurological: Negative for dizziness, headaches, light-headedness, numbness and seizures.  Psychiatric: Negative for behavioral problem, confusion, depressed mood and sleep disturbance.        Objective:  Objective   Vitals:   10/04/18 0913  BP: 118/70  Pulse: 91  Weight: 212 lb (96.2 kg)  Height: 5\' 3"  (1.6 m)   Body mass index is 37.55 kg/m.  Physical  Exam Vitals signs and nursing note reviewed.  Constitutional:      Appearance: She is well-developed.  HENT:     Head: Normocephalic and atraumatic.  Eyes:     Pupils: Pupils are equal, round, and reactive to light.  Cardiovascular:     Rate and Rhythm: Normal rate and regular rhythm.  Pulmonary:     Effort: Pulmonary effort is normal. No respiratory distress.  Abdominal:    Skin:    General: Skin is warm and dry.  Neurological:      Mental Status: She is alert and oriented to person, place, and time.  Psychiatric:        Behavior: Behavior normal.        Thought Content: Thought content normal.        Judgment: Judgment normal.        Assessment/Plan:     33 yo with wound infection of cesarean incision.  Incision appears to be completely healed. Much improved from photo last week. Encouraged patient to continue with daily cleaning. Patient will let me know if any problems arise.   More than 10 minutes were spent face to face with the patient in the room with more than 50% of the time spent providing counseling and discussing the plan of management.    Adelene Idler MD [facility], [facility] 10/04/2018 12:49 PM

## 2018-11-15 ENCOUNTER — Telehealth: Payer: Self-pay

## 2018-11-15 MED ORDER — NORETHINDRONE 0.35 MG PO TABS: 1.0000 | ORAL_TABLET | Freq: Every day | ORAL | 3 refills | Status: DC

## 2018-11-15 NOTE — Telephone Encounter (Signed)
Pt calling; needs norethindrone to be 90d supply per ins.  [phone]  Left detailed msg rx changed to 90d supply eRx'd.

## 2019-01-16 LAB — FETAL NONSTRESS TEST

## 2019-01-23 ENCOUNTER — Other Ambulatory Visit: Payer: Self-pay

## 2019-01-23 DIAGNOSIS — Z20822 Contact with and (suspected) exposure to covid-19: Secondary | ICD-10-CM

## 2019-01-24 LAB — NOVEL CORONAVIRUS, NAA: SARS-CoV-2, NAA: NOT DETECTED

## 2019-06-28 ENCOUNTER — Ambulatory Visit: Payer: Managed Care, Other (non HMO) | Admitting: Obstetrics and Gynecology

## 2019-07-10 ENCOUNTER — Ambulatory Visit (INDEPENDENT_AMBULATORY_CARE_PROVIDER_SITE_OTHER): Payer: Managed Care, Other (non HMO) | Admitting: Obstetrics & Gynecology

## 2019-07-10 ENCOUNTER — Other Ambulatory Visit (HOSPITAL_COMMUNITY)
Admission: RE | Admit: 2019-07-10 | Discharge: 2019-07-10 | Disposition: A | Discharge status: Home / Self Care | Payer: Managed Care, Other (non HMO) | Source: Ambulatory Visit | Attending: Obstetrics & Gynecology | Admitting: Obstetrics & Gynecology

## 2019-07-10 ENCOUNTER — Other Ambulatory Visit: Payer: Self-pay

## 2019-07-10 ENCOUNTER — Encounter: Payer: Self-pay | Admitting: Obstetrics & Gynecology

## 2019-07-10 ENCOUNTER — Telehealth: Payer: Self-pay | Admitting: Obstetrics & Gynecology

## 2019-07-10 VITALS — BP 110/60 | Wt 235.0 lb

## 2019-07-10 DIAGNOSIS — Z124 Encounter for screening for malignant neoplasm of cervix: Secondary | ICD-10-CM | POA: Insufficient documentation

## 2019-07-10 DIAGNOSIS — O0990 Supervision of high risk pregnancy, unspecified, unspecified trimester: Secondary | ICD-10-CM

## 2019-07-10 DIAGNOSIS — N926 Irregular menstruation, unspecified: Secondary | ICD-10-CM

## 2019-07-10 DIAGNOSIS — O0991 Supervision of high risk pregnancy, unspecified, first trimester: Secondary | ICD-10-CM

## 2019-07-10 DIAGNOSIS — O34211 Maternal care for low transverse scar from previous cesarean delivery: Secondary | ICD-10-CM

## 2019-07-10 DIAGNOSIS — Z3201 Encounter for pregnancy test, result positive: Secondary | ICD-10-CM

## 2019-07-10 DIAGNOSIS — Z3A11 11 weeks gestation of pregnancy: Secondary | ICD-10-CM

## 2019-07-10 DIAGNOSIS — Z369 Encounter for antenatal screening, unspecified: Secondary | ICD-10-CM

## 2019-07-10 DIAGNOSIS — O99211 Obesity complicating pregnancy, first trimester: Secondary | ICD-10-CM

## 2019-07-10 DIAGNOSIS — Z98891 History of uterine scar from previous surgery: Secondary | ICD-10-CM

## 2019-07-10 DIAGNOSIS — O99210 Obesity complicating pregnancy, unspecified trimester: Secondary | ICD-10-CM

## 2019-07-10 LAB — POCT URINALYSIS DIPSTICK OB
Glucose, UA: NEGATIVE
POC,PROTEIN,UA: NEGATIVE

## 2019-07-10 LAB — POCT URINE PREGNANCY: Preg Test, Ur: POSITIVE — AB

## 2019-07-10 NOTE — Patient Instructions (Signed)
First Trimester of Pregnancy The first trimester of pregnancy is from week 1 until the end of week 13 (months 1 through 3). A week after a sperm fertilizes an egg, the egg will implant on the wall of the uterus. This embryo will begin to develop into a baby. Genes from you and your partner will form the baby. The female genes will determine whether the baby will be a boy or a girl. At 6-8 weeks, the eyes and face will be formed, and the heartbeat can be seen on ultrasound. At the end of 12 weeks, all the baby's organs will be formed. Now that you are pregnant, you will want to do everything you can to have a healthy baby. Two of the most important things are to get good prenatal care and to follow your health care provider's instructions. Prenatal care is all the medical care you receive before the baby's birth. This care will help prevent, find, and treat any problems during the pregnancy and childbirth. Body changes during your first trimester Your body goes through many changes during pregnancy. The changes vary from woman to woman.  You may gain or lose a couple of pounds at first.  You may feel sick to your stomach (nauseous) and you may throw up (vomit). If the vomiting is uncontrollable, call your health care provider.  You may tire easily.  You may develop headaches that can be relieved by medicines. All medicines should be approved by your health care provider.  You may urinate more often. Painful urination may mean you have a bladder infection.  You may develop heartburn as a result of your pregnancy.  You may develop constipation because certain hormones are causing the muscles that push stool through your intestines to slow down.  You may develop hemorrhoids or swollen veins (varicose veins).  Your breasts may begin to grow larger and become tender. Your nipples may stick out more, and the tissue that surrounds them (areola) may become darker.  Your gums may bleed and may be  sensitive to brushing and flossing.  Dark spots or blotches (chloasma, mask of pregnancy) may develop on your face. This will likely fade after the baby is born.  Your menstrual periods will stop.  You may have a loss of appetite.  You may develop cravings for certain kinds of food.  You may have changes in your emotions from day to day, such as being excited to be pregnant or being concerned that something may go wrong with the pregnancy and baby.  You may have more vivid and strange dreams.  You may have changes in your hair. These can include thickening of your hair, rapid growth, and changes in texture. Some women also have hair loss during or after pregnancy, or hair that feels dry or thin. Your hair will most likely return to normal after your baby is born. What to expect at prenatal visits During a routine prenatal visit:  You will be weighed to make sure you and the baby are growing normally.  Your blood pressure will be taken.  Your abdomen will be measured to track your baby's growth.  The fetal heartbeat will be listened to between weeks 10 and 14 of your pregnancy.  Test results from any previous visits will be discussed. Your health care provider may ask you:  How you are feeling.  If you are feeling the baby move.  If you have had any abnormal symptoms, such as leaking fluid, bleeding, severe headaches, or abdominal   cramping.  If you are using any tobacco products, including cigarettes, chewing tobacco, and electronic cigarettes.  If you have any questions. Other tests that may be performed during your first trimester include:  Blood tests to find your blood type and to check for the presence of any previous infections. The tests will also be used to check for low iron levels (anemia) and protein on red blood cells (Rh antibodies). Depending on your risk factors, or if you previously had diabetes during pregnancy, you may have tests to check for high blood sugar  that affects pregnant women (gestational diabetes).  Urine tests to check for infections, diabetes, or protein in the urine.  An ultrasound to confirm the proper growth and development of the baby.  Fetal screens for spinal cord problems (spina bifida) and Down syndrome.  HIV (human immunodeficiency virus) testing. Routine prenatal testing includes screening for HIV, unless you choose not to have this test.  You may need other tests to make sure you and the baby are doing well. Follow these instructions at home: Medicines  Follow your health care provider's instructions regarding medicine use. Specific medicines may be either safe or unsafe to take during pregnancy.  Take a prenatal vitamin that contains at least 600 micrograms (mcg) of folic acid.  If you develop constipation, try taking a stool softener if your health care provider approves. Eating and drinking   Eat a balanced diet that includes fresh fruits and vegetables, whole grains, good sources of protein such as meat, eggs, or tofu, and low-fat dairy. Your health care provider will help you determine the amount of weight gain that is right for you.  Avoid raw meat and uncooked cheese. These carry germs that can cause birth defects in the baby.  Eating four or five small meals rather than three large meals a day may help relieve nausea and vomiting. If you start to feel nauseous, eating a few soda crackers can be helpful. Drinking liquids between meals, instead of during meals, also seems to help ease nausea and vomiting.  Limit foods that are high in fat and processed sugars, such as fried and sweet foods.  To prevent constipation: ? Eat foods that are high in fiber, such as fresh fruits and vegetables, whole grains, and beans. ? Drink enough fluid to keep your urine clear or pale yellow. Activity  Exercise only as directed by your health care provider. Most women can continue their usual exercise routine during  pregnancy. Try to exercise for 30 minutes at least 5 days a week. Exercising will help you: ? Control your weight. ? Stay in shape. ? Be prepared for labor and delivery.  Experiencing pain or cramping in the lower abdomen or lower back is a good sign that you should stop exercising. Check with your health care provider before continuing with normal exercises.  Try to avoid standing for long periods of time. Move your legs often if you must stand in one place for a long time.  Avoid heavy lifting.  Wear low-heeled shoes and practice good posture.  You may continue to have sex unless your health care provider tells you not to. Relieving pain and discomfort  Wear a good support bra to relieve breast tenderness.  Take warm sitz baths to soothe any pain or discomfort caused by hemorrhoids. Use hemorrhoid cream if your health care provider approves.  Rest with your legs elevated if you have leg cramps or low back pain.  If you develop varicose veins in   your legs, wear support hose. Elevate your feet for 15 minutes, 3-4 times a day. Limit salt in your diet. Prenatal care  Schedule your prenatal visits by the twelfth week of pregnancy. They are usually scheduled monthly at first, then more often in the last 2 months before delivery.  Write down your questions. Take them to your prenatal visits.  Keep all your prenatal visits as told by your health care provider. This is important. Safety  Wear your seat belt at all times when driving.  Make a list of emergency phone numbers, including numbers for family, friends, the hospital, and police and fire departments. General instructions  Ask your health care provider for a referral to a local prenatal education class. Begin classes no later than the beginning of month 6 of your pregnancy.  Ask for help if you have counseling or nutritional needs during pregnancy. Your health care provider can offer advice or refer you to specialists for help  with various needs.  Do not use hot tubs, steam rooms, or saunas.  Do not douche or use tampons or scented sanitary pads.  Do not cross your legs for long periods of time.  Avoid cat litter boxes and soil used by cats. These carry germs that can cause birth defects in the baby and possibly loss of the fetus by miscarriage or stillbirth.  Avoid all smoking, herbs, alcohol, and medicines not prescribed by your health care provider. Chemicals in these products affect the formation and growth of the baby.  Do not use any products that contain nicotine or tobacco, such as cigarettes and e-cigarettes. If you need help quitting, ask your health care provider. You may receive counseling support and other resources to help you quit.  Schedule a dentist appointment. At home, brush your teeth with a soft toothbrush and be gentle when you floss. Contact a health care provider if:  You have dizziness.  You have mild pelvic cramps, pelvic pressure, or nagging pain in the abdominal area.  You have persistent nausea, vomiting, or diarrhea.  You have a bad smelling vaginal discharge.  You have pain when you urinate.  You notice increased swelling in your face, hands, legs, or ankles.  You are exposed to fifth disease or chickenpox.  You are exposed to German measles (rubella) and have never had it. Get help right away if:  You have a fever.  You are leaking fluid from your vagina.  You have spotting or bleeding from your vagina.  You have severe abdominal cramping or pain.  You have rapid weight gain or loss.  You vomit blood or material that looks like coffee grounds.  You develop a severe headache.  You have shortness of breath.  You have any kind of trauma, such as from a fall or a car accident. Summary  The first trimester of pregnancy is from week 1 until the end of week 13 (months 1 through 3).  Your body goes through many changes during pregnancy. The changes vary from  woman to woman.  You will have routine prenatal visits. During those visits, your health care provider will examine you, discuss any test results you may have, and talk with you about how you are feeling. This information is not intended to replace advice given to you by your health care provider. Make sure you discuss any questions you have with your health care provider. Document Revised: 05/20/2017 Document Reviewed: 05/19/2016 Elsevier Patient Education  2020 [employer]vier Inc.  

## 2019-07-10 NOTE — Addendum Note (Signed)
Addended by: Cornelius Moras D on: 07/10/2019 03:47 PM   Modules accepted: Orders

## 2019-07-10 NOTE — Telephone Encounter (Signed)
Patient is ware of EKG schedule tomorrow 07/11/19 at 2:30 at [facility] to arrive at 2:00 for registration

## 2019-07-10 NOTE — Telephone Encounter (Signed)
-----   Message from Nadara Mustard, MD sent at 07/10/2019  2:50 PM EST ----- Regarding: Sch EKG for patient thx

## 2019-07-10 NOTE — Progress Notes (Signed)
07/10/2019   Chief Complaint: Missed period  Transfer of Care Patient: no  History of Present Illness: Shelby Aguilar is a 34 y.o. [id number] [redacted]w[redacted]d based on Patient's last menstrual period was 04/23/2019. with an Estimated Date of Delivery: 01/28/20, with the above CC.   Her periods were: last menstrual period 04/23/2019, and they were irreg while on POP and breast pumping.  POST uCG on 12 24/20.  Then noted nuasea and breast T.   She was using oral progesterone-only contraceptive when she conceived.  She has Positive signs or symptoms of nausea/vomiting of pregnancy. She has Negative signs or symptoms of miscarriage or preterm labor She identifies Negative Zika risk factors for her and her partner On any different medications around the time she conceived/early pregnancy: Yes - She was on Zoloft 50 mg daily but stopped when she had pos preg test; feels she may developing depression again since being off History of varicella: Yes   ROS: A 12-point review of systems was performed and negative, except as stated in the above HPI.  OBGYN History: As per HPI. OB History  Gravida Para Term Preterm AB Living  3 2 2     2   SAB TAB Ectopic Multiple Live Births        0 2    # Outcome Date GA Lbr Len/2nd Weight Sex Delivery Anes PTL Lv  3 Current           2 Term 07/26/18 [redacted]w[redacted]d  9 lb 8.7 oz (4.33 kg) M CS-LTranv EPI  LIV  1 Term 12/15/11 [redacted]w[redacted]d  8 lb 13 oz (3.997 kg) M CS-LTranv  N LIV     Complications: Cephalopelvic Disproportion, Failure to Progress in First Stage    Obstetric Comments  IOL with G1 for PUPPS    Any issues with any prior pregnancies: yes- CS with each; tried TOLAC last pregnancy but had failure to progress and post op CS infection.  Desires repeat this time.  Also, close pregnancy spacing.  Any prior children are healthy, doing well, without any problems or issues: yes History of pap smears: Yes. Last pap smear 2018. Abnormal: no  History of STIs: No   Past Medical  History: Past Medical History:  Diagnosis Date  . Asthma   . Depression   . PCOS (polycystic ovarian syndrome)     Past Surgical History: Past Surgical History:  Procedure Laterality Date  . CESAREAN SECTION  2013  . CESAREAN SECTION N/A 07/25/2018   Procedure: CESAREAN SECTION;  Surgeon: 09/23/2018, MD;  Location: [facility] ORS;  Service: Obstetrics;  Laterality: N/A;    Family History:  Family History  Problem Relation Age of Onset  . Breast cancer Paternal Grandmother 35   She denies any female cancers, bleeding or blood clotting disorders.  She denies any history of mental retardation, birth defects or genetic disorders in her or the FOB's history  Social History:  Social History   Socioeconomic History  . Marital status: Married    Spouse name: Not on file  . Number of children: Not on file  . Years of education: Not on file  . Highest education level: Not on file  Occupational History  . Not on file  Tobacco Use  . Smoking status: Never Smoker  . Smokeless tobacco: Never Used  Substance and Sexual Activity  . Alcohol use: Yes  . Drug use: No  . Sexual activity: Yes    Partners: Male    Birth control/protection: Pill  Other  Topics Concern  . Not on file  Social History Narrative  . Not on file   Social Determinants of Health   Financial Resource Strain:   . Difficulty of Paying Living Expenses: Not on file  Food Insecurity:   . Worried About Programme researcher, broadcasting/film/video in the Last Year: Not on file  . Ran Out of Food in the Last Year: Not on file  Transportation Needs:   . Lack of Transportation (Medical): Not on file  . Lack of Transportation (Non-Medical): Not on file  Physical Activity:   . Days of Exercise per Week: Not on file  . Minutes of Exercise per Session: Not on file  Stress:   . Feeling of Stress : Not on file  Social Connections:   . Frequency of Communication with Friends and Family: Not on file  . Frequency of Social Gatherings with  Friends and Family: Not on file  . Attends Religious Services: Not on file  . Active Member of Clubs or Organizations: Not on file  . Attends Banker Meetings: Not on file  . Marital Status: Not on file  Intimate Partner Violence:   . Fear of Current or Ex-Partner: Not on file  . Emotionally Abused: Not on file  . Physically Abused: Not on file  . Sexually Abused: Not on file   Any pets in the household: no  Allergy: No Known Allergies  Current Outpatient Medications:  Current Outpatient Medications:  .  Prenatal Vit-Fe Fumarate-FA (MULTIVITAMIN-PRENATAL) 27-0.8 MG TABS tablet, Take 1 tablet by mouth daily. , Disp: , Rfl:  .  albuterol (PROVENTIL HFA;VENTOLIN HFA) 108 (90 Base) MCG/ACT inhaler, Inhale 2 puffs into the lungs every 6 (six) hours as needed for wheezing or shortness of breath., Disp: , Rfl:  .  ferrous sulfate 325 (65 FE) MG tablet, Take 325 mg by mouth daily with breakfast., Disp: , Rfl:    Physical Exam:   BP 110/60   Wt 235 lb (106.6 kg)   LMP 04/23/2019   BMI 41.63 kg/m  Body mass index is 41.63 kg/m. Constitutional: Well nourished, well developed female in no acute distress.  Neck:  Supple, normal appearance, and no thyromegaly  Cardiovascular: S1, S2 normal, no murmur, rub or gallop, regular rate and rhythm Respiratory:  Clear to auscultation bilateral. Normal respiratory effort Abdomen: positive bowel sounds and no masses, hernias; diffusely non tender to palpation, non distended Breasts: breasts appear normal, no suspicious masses, no skin or nipple changes or axillary nodes. Neuro/Psych:  Normal mood and affect.  Skin:  Warm and dry.  Lymphatic:  No inguinal lymphadenopathy.   Pelvic exam: is not limited by body habitus EGBUS: within normal limits, Vagina: within normal limits and with no blood in the vault, Cervix: normal appearing cervix without discharge or lesions, closed/long/high, Uterus:  enlarged: 12 weeks, and Adnexa:  no mass,  fullness, tenderness  Assessment: Shelby Aguilar is a 33 y.o. [id number] [redacted]w[redacted]d based on Patient's last menstrual period was 04/23/2019. with an Estimated Date of Delivery: 01/28/20,  for prenatal care.  Plan:  1) Avoid alcoholic beverages. 2) Patient encouraged not to smoke.  3) Discontinue the use of all non-medicinal drugs and chemicals.  4) Take prenatal vitamins daily.  5) Seatbelt use advised 6) Nutrition, food safety (fish, cheese advisories, and high nitrite foods) and exercise discussed. 7) Hospital and practice style delivering at [facility] discussed  8) Patient is asked about travel to areas at risk for the Zika virus, and counseled to  avoid travel and exposure to mosquitoes or sexual partners who may have themselves been exposed to the virus. Testing is discussed, and will be ordered as appropriate.  9) Childbirth classes at [facility] advised 10) Genetic Screening, such as with 1st Trimester Screening, cell free fetal DNA, AFP testing, and Ultrasound, as well as with amniocentesis and CVS as appropriate, is discussed with patient. She plans to have genetic testing this pregnancy. [place] first to assess gestational age 77) [place] soon w glucola and possibly Materniti21 (based on true gest age) 72) Plan CS.  Close interpregnancy spacing, 2 prior CS, and past CS for again failure to progress, she prefers this plan. 13) Obesity risks discussed BMI >=40 [ ]  early 1h gtt - this has been ordered [ ]  u/s for dating - this has been ordered  [ ]  nutritional goals [ ]  folic acid 1mg  [ ]  bASA (>12 weeks) - discuss nv [ ]  consider nutrition consult [ ]  consider maternal EKG 1st trimester - this has been ordered [ ]  Growth u/s 28 [ ] , 32 [ ] , 36 weeks [ ]  [ ]  NST/AFI weekly 36+ weeks (36[] , 37[] , 38[] ) 14) Consider restart ZOLOFT if depression persists or worsens.  Also consider restart Zoloft at 36 weeks as prevention of PPD.  This has been discussed.  Problem list reviewed and updated.  Barnett Applebaum, MD,  [facility], [facility] 07/10/2019  2:51 PM

## 2019-07-11 ENCOUNTER — Ambulatory Visit
Admission: RE | Admit: 2019-07-11 | Discharge: 2019-07-11 | Disposition: A | Discharge status: Home / Self Care | Payer: Managed Care, Other (non HMO) | Source: Ambulatory Visit | Attending: Obstetrics & Gynecology | Admitting: Obstetrics & Gynecology

## 2019-07-11 DIAGNOSIS — E669 Obesity, unspecified: Secondary | ICD-10-CM | POA: Insufficient documentation

## 2019-07-11 DIAGNOSIS — O99210 Obesity complicating pregnancy, unspecified trimester: Secondary | ICD-10-CM | POA: Diagnosis present

## 2019-07-11 DIAGNOSIS — Z3A00 Weeks of gestation of pregnancy not specified: Secondary | ICD-10-CM | POA: Insufficient documentation

## 2019-07-11 DIAGNOSIS — O0990 Supervision of high risk pregnancy, unspecified, unspecified trimester: Secondary | ICD-10-CM

## 2019-07-12 ENCOUNTER — Ambulatory Visit: Payer: Managed Care, Other (non HMO) | Attending: Internal Medicine

## 2019-07-12 DIAGNOSIS — Z20822 Contact with and (suspected) exposure to covid-19: Secondary | ICD-10-CM

## 2019-07-12 LAB — RPR+RH+ABO+RUB AB+AB SCR+CB...
Antibody Screen: NEGATIVE
HIV Screen 4th Generation wRfx: NONREACTIVE
Hematocrit: 35.1 % (ref 34.0–46.6)
Hemoglobin: 12 g/dL (ref 11.1–15.9)
Hepatitis B Surface Ag: NEGATIVE
MCH: 27.3 pg (ref 26.6–33.0)
MCHC: 34.2 g/dL (ref 31.5–35.7)
MCV: 80 fL (ref 79–97)
Platelets: 300 10*3/uL (ref 150–450)
RBC: 4.4 x10E6/uL (ref 3.77–5.28)
RDW: 12.9 % (ref 11.7–15.4)
RPR Ser Ql: NONREACTIVE
Rh Factor: POSITIVE
Rubella Antibodies, IGG: 10.7 index (ref >0.99)
Varicella zoster IgG: 1031 index (ref >165)
WBC: 12.1 10*3/uL — ABNORMAL HIGH (ref 3.4–10.8)

## 2019-07-12 LAB — URINE CULTURE

## 2019-07-12 LAB — CYTOLOGY - PAP
Chlamydia: NEGATIVE
Comment: NEGATIVE
Comment: NORMAL
Diagnosis: NEGATIVE
Neisseria Gonorrhea: NEGATIVE

## 2019-07-12 LAB — HEMOGLOBINOPATHY EVALUATION
HGB C: 0 %
HGB S: 0 %
HGB VARIANT: 0 %
Hemoglobin A2 Quantitation: 2.1 % (ref 1.8–3.2)
Hemoglobin F Quantitation: 0 % (ref 0.0–2.0)
Hgb A: 97.9 % (ref 96.4–98.8)

## 2019-07-13 ENCOUNTER — Encounter: Payer: Self-pay | Admitting: Obstetrics and Gynecology

## 2019-07-13 LAB — NOVEL CORONAVIRUS, NAA: SARS-CoV-2, NAA: NOT DETECTED

## 2019-07-13 NOTE — Telephone Encounter (Signed)
This encounter was created in error - please disregard.

## 2019-07-19 ENCOUNTER — Ambulatory Visit (INDEPENDENT_AMBULATORY_CARE_PROVIDER_SITE_OTHER): Payer: Managed Care, Other (non HMO) | Admitting: Obstetrics and Gynecology

## 2019-07-19 ENCOUNTER — Other Ambulatory Visit: Payer: Self-pay

## 2019-07-19 ENCOUNTER — Ambulatory Visit (INDEPENDENT_AMBULATORY_CARE_PROVIDER_SITE_OTHER): Payer: Managed Care, Other (non HMO)

## 2019-07-19 ENCOUNTER — Encounter: Payer: Managed Care, Other (non HMO) | Admitting: Obstetrics and Gynecology

## 2019-07-19 ENCOUNTER — Other Ambulatory Visit: Payer: Managed Care, Other (non HMO)

## 2019-07-19 ENCOUNTER — Encounter: Payer: Self-pay | Admitting: Obstetrics and Gynecology

## 2019-07-19 VITALS — BP 118/74 | Wt 235.0 lb

## 2019-07-19 DIAGNOSIS — Z6841 Body Mass Index (BMI) 40.0 and over, adult: Secondary | ICD-10-CM

## 2019-07-19 DIAGNOSIS — O3680X0 Pregnancy with inconclusive fetal viability, not applicable or unspecified: Secondary | ICD-10-CM | POA: Diagnosis not present

## 2019-07-19 DIAGNOSIS — N926 Irregular menstruation, unspecified: Secondary | ICD-10-CM

## 2019-07-19 DIAGNOSIS — Z3A10 10 weeks gestation of pregnancy: Secondary | ICD-10-CM

## 2019-07-19 DIAGNOSIS — E669 Obesity, unspecified: Secondary | ICD-10-CM | POA: Insufficient documentation

## 2019-07-19 DIAGNOSIS — O99210 Obesity complicating pregnancy, unspecified trimester: Secondary | ICD-10-CM

## 2019-07-19 DIAGNOSIS — O0990 Supervision of high risk pregnancy, unspecified, unspecified trimester: Secondary | ICD-10-CM

## 2019-07-19 DIAGNOSIS — Z98891 History of uterine scar from previous surgery: Secondary | ICD-10-CM

## 2019-07-19 DIAGNOSIS — O34219 Maternal care for unspecified type scar from previous cesarean delivery: Secondary | ICD-10-CM

## 2019-07-19 DIAGNOSIS — Z6839 Body mass index (BMI) 39.0-39.9, adult: Secondary | ICD-10-CM | POA: Insufficient documentation

## 2019-07-19 NOTE — Progress Notes (Signed)
Routine Prenatal Care Visit  Subjective  Shelby Aguilar is a 34 y.o. G3P2002 at [redacted]w[redacted]d being seen today for ongoing prenatal care.  She is currently monitored for the following issues for this high-risk pregnancy and has GOITER, UNSPECIFIED; PCOD (polycystic ovarian disease); Obesity affecting pregnancy, antepartum; Supervision of high risk pregnancy, antepartum; History of 2 cesarean sections; and BMI 39.0-39.9,adult on their problem list.  ----------------------------------------------------------------------------------- Patient reports no complaints.    . Vag. Bleeding: None.   . Leaking Fluid denies.  Dating u/s today changes EDD ----------------------------------------------------------------------------------- The following portions of the patient's history were reviewed and updated as appropriate: allergies, current medications, past family history, past medical history, past social history, past surgical history and problem list. Problem list updated.  Objective  Blood pressure 118/74, weight 235 lb (106.6 kg), last menstrual period 04/23/2019, currently breastfeeding. Pregravid weight 220 lb (99.8 kg) Total Weight Gain 15 lb (6.804 kg) Urinalysis: Urine Protein    Urine Glucose    Fetal Status: Fetal Heart Rate (bpm): 160s         General:  Alert, oriented and cooperative. Patient is in no acute distress.  Skin: Skin is warm and dry. No rash noted.   Cardiovascular: Normal heart rate noted  Respiratory: Normal respiratory effort, no problems with respiration noted  Abdomen: Soft, gravid, appropriate for gestational age. Pain/Pressure: Absent     Pelvic:  Cervical exam deferred        Extremities: Normal range of motion.     Mental Status: Normal mood and affect. Normal behavior. Normal judgment and thought content.   Imaging Results US OB LESS THAN 14 WEEKS WITH OB TRANSVAGINAL  Result Date: 07/19/2019 Patient Name: Shelby Aguilar DOB: 20-Oct-1985 MRN: [id number]  ULTRASOUND REPORT Location: [facility] Date of Service: 07/19/2019 Indications:dating Findings: Mason Jim intrauterine pregnancy is visualized with a CRL consistent with [redacted]w[redacted]d gestation, giving an (U/S) EDD of 02/11/2020. The (U/S) EDD is not consistent with the clinically established EDD of 01/28/2020. FHR: 169 BPM CRL measurement: 34.9 mm Yolk sac is not visualized. Amnion: visualized and appears normal Right Ovary is normal in appearance. Left Ovary is normal appearance. Corpus luteal cyst:  Right ovary Survey of the adnexa demonstrates no adnexal masses. There is no free peritoneal fluid in the cul de sac. Impression: 1. [redacted]w[redacted]d Viable Singleton Intrauterine pregnancy by U/S. 2. (U/S) EDD is not consistent with Clinically established EDD of 01/28/2020. Deanna Artis, RT There is a viable singleton gestation.  Detailed evaluation of the fetal anatomy is precluded by early gestational age.  It must be noted that a normal ultrasound particular at this early gestational age is unable to rule out fetal aneuploidy, risk of first trimester miscarriage, or anatomic birth defects. Thomasene Mohair, MD, [facility], [facility] 07/19/2019 5:00 PM      Assessment   33 y.o. A6T0160 at [redacted]w[redacted]d by  02/11/2020, by Ultrasound presenting for routine prenatal visit  Plan   pregnancy Problems (from 04/23/19 to present)    Problem Noted Resolved   BMI 39.0-39.9,adult 07/19/2019 by Conard Novak, MD No   History of 2 cesarean sections 07/10/2019 by Nadara Mustard, MD No   Obesity affecting pregnancy, antepartum 11/19/2017 by Tresea Mall, CNM No   Overview Signed 07/10/2019  2:47 PM by Nadara Mustard, MD    BMI >=40 [ ]  early 1h gtt -  [ ]  u/s for dating [ ]   [ ]  nutritional goals [ ]  folic acid 1mg  [ ]   bASA (>12 weeks) [ ]  consider nutrition consult [ ]  consider maternal EKG 1st trimester [ ]  Growth u/s 28 [ ] , 32 [ ] , 36 weeks [ ]  [ ]  NST/AFI weekly 36+ weeks (36[] , 37[] ,  38[] )       Supervision of high risk pregnancy, antepartum 11/19/2017 by Rod Can, CNM No   Overview Addendum 07/10/2019  2:47 PM by Gae Dry, MD    Clinic [facility] Prenatal Labs  Dating LMP Blood type: --/--/A POS Performed at [facility], [address] [place], [place] [address]  [phone])   Genetic Screen  NIPS: Antibody:NEG (02/04 1340)  Anatomic [place]  Rubella:   Varicella: @VZVIGG @  GTT Early:               Third trimester:  RPR: Non Reactive (02/04 1340)   Rhogam N/A HBsAg:     TDaP vaccine          Flu Shot: HIV:     Baby Food     Breast                           GBS:   Contraception  Pap:07/10/19  CBB  No   CS/VBAC CS x2, plans CS   Support Person               Preterm labor symptoms and general obstetric precautions including but not limited to vaginal bleeding, contractions, leaking of fluid and fetal movement were reviewed in detail with the patient. Please refer to After Visit Summary for other counseling recommendations.   1h gtt today Desires genetic screening, but too late at time of appointment to collect  Return in about 4 weeks (around 08/16/2019) for Routine Prenatal Appointment.  Prentice Docker, MD, [facility], [facility] 07/19/2019 5:08 PM

## 2019-07-24 ENCOUNTER — Other Ambulatory Visit: Payer: Self-pay | Admitting: Obstetrics & Gynecology

## 2019-07-24 ENCOUNTER — Telehealth: Payer: Self-pay | Admitting: Obstetrics & Gynecology

## 2019-07-24 DIAGNOSIS — O99810 Abnormal glucose complicating pregnancy: Secondary | ICD-10-CM

## 2019-07-24 LAB — GLUCOSE, 1 HOUR GESTATIONAL: Gestational Diabetes Screen: 147 mg/dL — ABNORMAL HIGH (ref 65–139)

## 2019-07-24 NOTE — Telephone Encounter (Signed)
Called and left voice mail for patient to call back to be schedule °

## 2019-07-24 NOTE — Telephone Encounter (Signed)
-----   Message from Nadara Mustard, MD sent at 07/24/2019  2:10 PM EST ----- Sch 3 hour GTT with patient soon.  MyChart message left for patient.

## 2019-07-25 NOTE — Telephone Encounter (Signed)
Called and left voice mail for patient to call back to be schedule °

## 2019-07-26 NOTE — Telephone Encounter (Signed)
SCHEDULED

## 2019-08-01 ENCOUNTER — Other Ambulatory Visit: Payer: Managed Care, Other (non HMO)

## 2019-08-01 ENCOUNTER — Other Ambulatory Visit: Payer: Self-pay

## 2019-08-01 ENCOUNTER — Other Ambulatory Visit: Payer: Self-pay | Admitting: Obstetrics and Gynecology

## 2019-08-01 DIAGNOSIS — Z1379 Encounter for other screening for genetic and chromosomal anomalies: Secondary | ICD-10-CM

## 2019-08-01 DIAGNOSIS — O0990 Supervision of high risk pregnancy, unspecified, unspecified trimester: Secondary | ICD-10-CM

## 2019-08-01 DIAGNOSIS — Z3A12 12 weeks gestation of pregnancy: Secondary | ICD-10-CM

## 2019-08-01 DIAGNOSIS — Z6839 Body mass index (BMI) 39.0-39.9, adult: Secondary | ICD-10-CM

## 2019-08-01 DIAGNOSIS — Z98891 History of uterine scar from previous surgery: Secondary | ICD-10-CM

## 2019-08-01 DIAGNOSIS — O99810 Abnormal glucose complicating pregnancy: Secondary | ICD-10-CM

## 2019-08-01 DIAGNOSIS — O99210 Obesity complicating pregnancy, unspecified trimester: Secondary | ICD-10-CM

## 2019-08-02 ENCOUNTER — Other Ambulatory Visit: Payer: Self-pay | Admitting: Obstetrics & Gynecology

## 2019-08-02 DIAGNOSIS — O24319 Unspecified pre-existing diabetes mellitus in pregnancy, unspecified trimester: Secondary | ICD-10-CM | POA: Insufficient documentation

## 2019-08-02 DIAGNOSIS — O0990 Supervision of high risk pregnancy, unspecified, unspecified trimester: Secondary | ICD-10-CM

## 2019-08-02 LAB — GESTATIONAL GLUCOSE TOLERANCE
Glucose, Fasting: 103 mg/dL — ABNORMAL HIGH (ref 65–94)
Glucose, GTT - 1 Hour: 189 mg/dL — ABNORMAL HIGH (ref 65–179)
Glucose, GTT - 2 Hour: 160 mg/dL — ABNORMAL HIGH (ref 65–154)
Glucose, GTT - 3 Hour: 149 mg/dL — ABNORMAL HIGH (ref 65–139)

## 2019-08-07 ENCOUNTER — Telehealth: Payer: Managed Care, Other (non HMO) | Admitting: Nurse Practitioner

## 2019-08-07 DIAGNOSIS — R112 Nausea with vomiting, unspecified: Secondary | ICD-10-CM | POA: Diagnosis not present

## 2019-08-07 DIAGNOSIS — A09 Infectious gastroenteritis and colitis, unspecified: Secondary | ICD-10-CM | POA: Diagnosis not present

## 2019-08-07 MED ORDER — CIPROFLOXACIN HCL 500 MG PO TABS: 500.0000 mg | ORAL_TABLET | Freq: Two times a day (BID) | ORAL | 0 refills | Status: DC

## 2019-08-07 MED ORDER — AZITHROMYCIN 250 MG PO TABS: ORAL_TABLET | ORAL | 0 refills | Status: DC

## 2019-08-07 MED ORDER — ONDANSETRON HCL 4 MG PO TABS: 4.0000 mg | ORAL_TABLET | Freq: Three times a day (TID) | ORAL | 0 refills | Status: DC | PRN

## 2019-08-07 NOTE — Progress Notes (Signed)
We are sorry that you are not feeling well.  Here is how we plan to help!  Based on what you have shared with me it looks like you have Acute Infectious Diarrhea.  Most cases of acute diarrhea are due to infections with virus and bacteria and are self-limited conditions lasting less than 14 days.  Not anything you can take for the diarrhea due to pregnancy. Contacted patient via phone and she stated she is [redacted] weeks pregnant which puts her in her second trimester.   Optional: Zofran 4 mg 1 tablet every 8 hours as needed for nausea and vomiting ( safe in second trimester )  Optional: zithromax z pak ( safe in second trimester )  HOME CARE  We recommend changing your diet to help with your symptoms for the next few days.  Drink plenty of fluids that contain water salt and sugar. Sports drinks such as Gatorade may help.   You may try broths, soups, bananas, applesauce, soft breads, mashed potatoes or crackers.   You are considered infectious for as long as the diarrhea continues. Hand washing or use of alcohol based hand sanitizers is recommend.  It is best to stay out of work or school until your symptoms stop.   GET HELP RIGHT AWAY  If you have dark yellow colored urine or do not pass urine frequently you should drink more fluids.    If your symptoms worsen   If you feel like you are going to pass out (faint)  You have a new problem  MAKE SURE YOU   Understand these instructions.  Will watch your condition.  Will get help right away if you are not doing well or get worse.  Your e-visit answers were reviewed by a board certified advanced clinical practitioner to complete your personal care plan.  Depending on the condition, your plan could have included both over the counter or prescription medications.  If there is a problem please reply  once you have received a response from your provider.  Your safety is important to [place].  If you have drug allergies check your  prescription carefully.    You can use MyChart to ask questions about today's visit, request a non-urgent call back, or ask for a work or school excuse for 24 hours related to this e-Visit. If it has been greater than 24 hours you will need to follow up with your provider, or enter a new e-Visit to address those concerns.   You will get an e-mail in the next two days asking about your experience.  I hope that your e-visit has been valuable and will speed your recovery. Thank you for using e-visits.  5-10 minutes spent reviewing and documenting in chart.

## 2019-08-08 ENCOUNTER — Encounter: Payer: Self-pay | Admitting: *Deleted

## 2019-08-08 ENCOUNTER — Encounter: Payer: Managed Care, Other (non HMO) | Attending: Obstetrics & Gynecology | Admitting: *Deleted

## 2019-08-08 ENCOUNTER — Other Ambulatory Visit: Payer: Self-pay

## 2019-08-08 VITALS — BP 102/68 | Ht 63.0 in | Wt 232.6 lb

## 2019-08-08 DIAGNOSIS — O24419 Gestational diabetes mellitus in pregnancy, unspecified control: Secondary | ICD-10-CM | POA: Insufficient documentation

## 2019-08-08 DIAGNOSIS — Z3A00 Weeks of gestation of pregnancy not specified: Secondary | ICD-10-CM | POA: Insufficient documentation

## 2019-08-08 DIAGNOSIS — Z713 Dietary counseling and surveillance: Secondary | ICD-10-CM | POA: Diagnosis not present

## 2019-08-08 DIAGNOSIS — O24410 Gestational diabetes mellitus in pregnancy, diet controlled: Secondary | ICD-10-CM

## 2019-08-08 NOTE — Patient Instructions (Addendum)
Read booklet on Gestational Diabetes Follow Gestational Meal Planning Guidelines Avoid fruit at breakfast if blood sugars elevated Limit foods high in fat (biscuit, bagel, hashbrown) Complete a 3 Day Food Record and bring to next appointment Check blood sugars 4 x day - before breakfast and 2 hrs after every meal and record  Bring blood sugar log to all appointments Call MD for prescription for meter strips and lancets Strips  One Touch Verio Lancets   One Touch Delica Plus Purchase urine ketone strips in instructed by MD and check urine ketones every am:  If + increase bedtime snack to 1 protein and 2 carbohydrate servings Walk 20-30 minutes at least 5 x week if permitted by MD

## 2019-08-08 NOTE — Progress Notes (Signed)
Diabetes Self-Management Education  Visit Type: First/Initial  Appt. Start Time: 0850 Appt. End Time: 1020  08/08/2019  [name], identified by name and date of birth, is a 34 y.o. female with a diagnosis of Diabetes: Gestational Diabetes.   ASSESSMENT  Blood pressure 102/68, height 5\' 3"  (1.6 m), weight 232 lb 9.6 oz (105.5 kg), last menstrual period 04/23/2019, estimated date of delivery 02/11/2020 Body mass index is 41.2 kg/m.  Diabetes Self-Management Education - 08/08/19 1058      Visit Information   Visit Type  First/Initial      Initial Visit   Diabetes Type  Gestational Diabetes    Are you currently following a meal plan?  No    Are you taking your medications as prescribed?  Yes    Date Diagnosed  2 weeks      Health Coping   How would you rate your overall health?  Fair      Psychosocial Assessment   Patient Belief/Attitude about Diabetes  Other (comment)   "sad, depressed"   Self-care barriers  None    Self-management support  Doctor's office;Family    Other persons present  Spouse/SO    Patient Concerns  Nutrition/Meal planning;Glycemic Control;Monitoring;Weight Control;Healthy Lifestyle    Special Needs  None    Preferred Learning Style  Auditory;Hands on    Learning Readiness  Ready    How often do you need to have someone help you when you read instructions, pamphlets, or other written materials from your doctor or pharmacy?  1 - Never    What is the last grade level you completed in school?  masters      Pre-Education Assessment   Patient understands the diabetes disease and treatment process.  Needs Instruction    Patient understands incorporating nutritional management into lifestyle.  Needs Instruction    Patient undertands incorporating physical activity into lifestyle.  Needs Instruction    Patient understands using medications safely.  Needs Instruction    Patient understands monitoring blood glucose, interpreting and using results   Needs Instruction    Patient understands prevention, detection, and treatment of acute complications.  Needs Instruction    Patient understands prevention, detection, and treatment of chronic complications.  Needs Instruction    Patient understands how to develop strategies to address psychosocial issues.  Needs Instruction    Patient understands how to develop strategies to promote health/change behavior.  Needs Instruction      Complications   How often do you check your blood sugar?  0 times/day (not testing)   Provided One Touch Verio Flex meter and ins tructed on use. BG upon return demonstration was 107 mg/dL at 08/10/19 am - 2 hrs pp.   Have you had a dilated eye exam in the past 12 months?  Yes    Have you had a dental exam in the past 12 months?  Yes    Are you checking your feet?  No      Dietary Intake   Breakfast  bagel with cream cheese; boiled egg and bacon; bacon egg and cheese biscuit with hashbrown, English muffin with peanut butter and banana    Snack (morning)  reports 0-2 snacks/day - cheese, fruit (banana, orange, pineapple, mango, berries, watermelon)    Lunch  steak and eggs, salads, left overs    Dinner  beef, chicken, pork, fish, potatoes, peas, beans, corn, pasta, rice, green beans, broccoli, cauliflower, greens, lettuce, carrots, cuccumbers    Beverage(s)  water, 1 coffee per  day, unsweetened tea      Exercise   Exercise Type  ADL's      Patient Education   Previous Diabetes Education  No    Disease state   Definition of diabetes, type 1 and 2, and the diagnosis of diabetes;Factors that contribute to the development of diabetes    Nutrition management   Role of diet in the treatment of diabetes and the relationship between the three main macronutrients and blood glucose level;Food label reading, portion sizes and measuring food.;Reviewed blood glucose goals for pre and post meals and how to evaluate the patients' food intake on their blood glucose level.    Physical  activity and exercise   Role of exercise on diabetes management, blood pressure control and cardiac health.    Medications  Other (comment)   Limited use of oral medications during pregnancy and possibility of insulin   Monitoring  Taught/evaluated SMBG meter.;Purpose and frequency of SMBG.;Taught/discussed recording of test results and interpretation of SMBG.;Identified appropriate SMBG and/or A1C goals.;Ketone testing, when, how.    Chronic complications  Relationship between chronic complications and blood glucose control    Psychosocial adjustment  Identified and addressed patients feelings and concerns about diabetes    Preconception care  Pregnancy and GDM  Role of pre-pregnancy blood glucose control on the development of the fetus;Reviewed with patient blood glucose goals with pregnancy;Role of family planning for patients with diabetes      Individualized Goals (developed by patient)   Reducing Risk  Improve blood sugars Prevent diabetes complications Lose weight Lead a healthier lifestyle     Outcomes   Expected Outcomes  Demonstrated interest in learning. Expect positive outcomes       Individualized Plan for Diabetes Self-Management Training:   Learning Objective:  Patient will have a greater understanding of diabetes self-management. Patient education plan is to attend individual and/or group sessions per assessed needs and concerns.   Plan:   Patient Instructions  Read booklet on Gestational Diabetes Follow Gestational Meal Planning Guidelines Avoid fruit at breakfast if blood sugars elevated Limit foods high in fat (biscuit, bagel, hashbrown) Complete a 3 Day Food Record and bring to next appointment Check blood sugars 4 x day - before breakfast and 2 hrs after every meal and record  Bring blood sugar log to all appointments Call MD for prescription for meter strips and lancets Strips  One Touch Verio Lancets   One Touch Delica Plus Purchase urine ketone strips in  instructed by MD and check urine ketones every am:  If + increase bedtime snack to 1 protein and 2 carbohydrate servings Walk 20-30 minutes at least 5 x week if permitted by MD  Expected Outcomes:  Demonstrated interest in learning. Expect positive outcomes  Education material provided:  Gestational Booklet Gestational Meal Planning Guidelines Simple Meal Plan Viewed Gestational Diabetes Video Meter - One Touch Verio Flex 3 Day Food Record Goals for a Healthy Pregnancy  If problems or questions, patient to contact team via:  Johny Drilling, [place], [place], CDE [phone]  Future DSME appointment:  August 16, 2019 with the dietitian

## 2019-08-10 ENCOUNTER — Other Ambulatory Visit: Payer: Self-pay | Admitting: Certified Nurse Midwife

## 2019-08-10 LAB — MATERNIT 21 PLUS CORE, BLOOD
Fetal Fraction: 8
Result (T21): NEGATIVE
Trisomy 13 (Patau syndrome): NEGATIVE
Trisomy 18 (Edwards syndrome): NEGATIVE
Trisomy 21 (Down syndrome): NEGATIVE

## 2019-08-10 MED ORDER — ONETOUCH ULTRASOFT LANCETS MISC: 5 refills | Status: AC

## 2019-08-10 MED ORDER — GLUCOSE BLOOD VI STRP: ORAL_STRIP | 5 refills | Status: DC

## 2019-08-10 NOTE — Telephone Encounter (Signed)
Can you send in this for Dr Tiburcio Pea.

## 2019-08-14 ENCOUNTER — Other Ambulatory Visit: Payer: Self-pay

## 2019-08-14 ENCOUNTER — Ambulatory Visit (INDEPENDENT_AMBULATORY_CARE_PROVIDER_SITE_OTHER): Payer: Managed Care, Other (non HMO) | Admitting: Obstetrics and Gynecology

## 2019-08-14 VITALS — BP 110/54 | Wt 233.0 lb

## 2019-08-14 DIAGNOSIS — O24312 Unspecified pre-existing diabetes mellitus in pregnancy, second trimester: Secondary | ICD-10-CM

## 2019-08-14 DIAGNOSIS — Z98891 History of uterine scar from previous surgery: Secondary | ICD-10-CM

## 2019-08-14 DIAGNOSIS — O09892 Supervision of other high risk pregnancies, second trimester: Secondary | ICD-10-CM

## 2019-08-14 DIAGNOSIS — O99210 Obesity complicating pregnancy, unspecified trimester: Secondary | ICD-10-CM

## 2019-08-14 DIAGNOSIS — O24319 Unspecified pre-existing diabetes mellitus in pregnancy, unspecified trimester: Secondary | ICD-10-CM

## 2019-08-14 DIAGNOSIS — O0990 Supervision of high risk pregnancy, unspecified, unspecified trimester: Secondary | ICD-10-CM

## 2019-08-14 DIAGNOSIS — O99212 Obesity complicating pregnancy, second trimester: Secondary | ICD-10-CM

## 2019-08-14 DIAGNOSIS — O34218 Maternal care for other type scar from previous cesarean delivery: Secondary | ICD-10-CM

## 2019-08-14 DIAGNOSIS — Z3A14 14 weeks gestation of pregnancy: Secondary | ICD-10-CM

## 2019-08-14 LAB — POCT URINALYSIS DIPSTICK OB
Glucose, UA: NEGATIVE
POC,PROTEIN,UA: NEGATIVE

## 2019-08-14 NOTE — Progress Notes (Signed)
ROB

## 2019-08-14 NOTE — Progress Notes (Signed)
Routine Prenatal Care Visit  Subjective  Shelby Aguilar is a 34 y.o. G3P2002 at [redacted]w[redacted]d being seen today for ongoing prenatal care.  She is currently monitored for the following issues for this high-risk pregnancy and has GOITER, UNSPECIFIED; PCOD (polycystic ovarian disease); Obesity affecting pregnancy, antepartum; Supervision of high risk pregnancy, antepartum; History of 2 cesarean sections; BMI 39.0-39.9,adult; and Pregestational diabetes mellitus, modified White class B on their problem list.  ----------------------------------------------------------------------------------- Patient reports no complaints.   Contractions: Not present. Vag. Bleeding: None.  Movement: Absent. Denies leaking of fluid.  ----------------------------------------------------------------------------------- The following portions of the patient's history were reviewed and updated as appropriate: allergies, current medications, past family history, past medical history, past social history, past surgical history and problem list. Problem list updated.   Objective  Blood pressure (!) 110/54, weight 233 lb (105.7 kg), last menstrual period 04/23/2019, currently breastfeeding. Pregravid weight 220 lb (99.8 kg) Total Weight Gain 13 lb (5.897 kg) Urinalysis:      Fetal Status: Fetal Heart Rate (bpm): 160   Movement: Absent     General:  Alert, oriented and cooperative. Patient is in no acute distress.  Skin: Skin is warm and dry. No rash noted.   Cardiovascular: Normal heart rate noted  Respiratory: Normal respiratory effort, no problems with respiration noted  Abdomen: Soft, gravid, appropriate for gestational age. Pain/Pressure: Absent     Pelvic:  Cervical exam deferred        Extremities: Normal range of motion.     ental Status: Normal mood and affect. Normal behavior. Normal judgment and thought content.   Date Fasting Breakfast Lunch Dinner  2/17  107 117 119  2/18 102 109 110   2/19 125 118 102  120  2/20 96 120 105 115  2/21 115 101 109 115  2/22 115 101  135  2/23 124 114            Assessment   33 y.o. G3P2002 at [redacted]w[redacted]d by  02/11/2020, by Ultrasound presenting for routine prenatal visit  Plan   pregnancy Problems (from 04/23/19 to present)    Problem Noted Resolved   BMI 39.0-39.9,adult 07/19/2019 by Conard Novak, MD No   History of 2 cesarean sections 07/10/2019 by Nadara Mustard, MD No   Obesity affecting pregnancy, antepartum 11/19/2017 by Tresea Mall, CNM No   Overview Signed 07/10/2019  2:47 PM by Nadara Mustard, MD    BMI >=40 [ ]  early 1h gtt -  [ ]  u/s for dating [ ]   [ ]  nutritional goals [ ]  folic acid 1mg  [ ]  bASA (>12 weeks) [ ]  consider nutrition consult [ ]  consider maternal EKG 1st trimester [ ]  Growth u/s 28 [ ] , 32 [ ] , 36 weeks [ ]  [ ]  NST/AFI weekly 36+ weeks (36[] , 37[] , 38[] )       Supervision of high risk pregnancy, antepartum 11/19/2017 by , CNM No   Overview Addendum 08/02/2019 10:48 AM by , MD    [facility] Prenatal Labs  Dating 10 week Blood type: A/Positive/-- (01/19 1452)   Genetic Screen  NIPS: XX Antibody:Negative (01/19 1452)  Anatomic  Rubella: 10.70 (01/19 1452) Varicella: Imm  GTT Early: 147 3 hr GTT 103  / 189 / 160 /149 RPR: Non Reactive (01/19 1452)   Rhogam N/A HBsAg: Negative (01/19 1452)   TDaP vaccine          Flu Shot:03/2019 HIV: Non Reactive (01/19 1452)  Baby Food     Breast                           GBS:   Contraception  Pap:07/10/19  CBB  No   CS/VBAC CS x2, plans CS   Support Person                Gestational age appropriate obstetric precautions including but not limited to vaginal bleeding, contractions, leaking of fluid and fetal movement were reviewed in detail with the patient.    - BG log reviewed, mealtime values look good.  Fasting consistently elevated. Did achieve a 96 after a bedtime snack so I suggested adding this to routine.  If remains  elevated with discussed pros and cons or oral antihyperglycemics vs insulin - pediatric cardiology for fetal echo  Return in about 2 weeks (around 08/28/2019) for [place].  Malachy Mood, MD, [facility], [facility] 08/14/2019, 3:16 PM

## 2019-08-14 NOTE — Patient Instructions (Signed)
Gestational Diabetes Mellitus, Self Care Caring for yourself after you have been diagnosed with gestational diabetes (gestational diabetes mellitus) means keeping your blood sugar (glucose) under control. You can do that with a balance of:  Nutrition.  Exercise.  Lifestyle changes.  Medicines or insulin, if necessary.  Support from your team of health care providers and others. The following information explains what you need to know to manage your gestational diabetes at home. What are the risks? If gestational diabetes is treated, it is unlikely to cause problems. If it is not controlled with treatment, it may cause problems during labor and delivery, and some of those problems can be harmful to the unborn baby (fetus) and the mother. Uncontrolled gestational diabetes may also cause the newborn baby to have breathing problems and low blood glucose. Women who get gestational diabetes are more likely to develop it if they get pregnant again, and they are more likely to develop type 2 diabetes in the future. How to monitor blood glucose   Check your blood glucose every day during your pregnancy. Do this as often as told by your health care provider.  Contact your health care provider if your blood glucose is above your target for two tests in a row. Your health care provider will set individualized treatment goals for you. Generally, the goal of treatment is to maintain the following blood glucose levels during pregnancy:  Before meals (preprandial): at or below 95 mg/dL (5.3 mmol/L).  After meals (postprandial): ? One hour after a meal: at or below 140 mg/dL (7.8 mmol/L). ? Two hours after a meal: at or below 120 mg/dL (6.7 mmol/L).  A1c (hemoglobin A1c) level: 6-6.5%. How to manage hyperglycemia and hypoglycemia Hyperglycemia symptoms Hyperglycemia, also called high blood glucose, occurs when blood glucose is too high. Make sure you know the early signs of hyperglycemia, such as:   Increased thirst.  Hunger.  Feeling very tired.  Needing to urinate more often than usual.  Blurry vision. Hypoglycemia symptoms Hypoglycemia, also called low blood glucose, occurs with a blood glucose level at or below 70 mg/dL (3.9 mmol/L). The risk for hypoglycemia increases during or after exercise, during sleep, during illness, and when skipping meals or not eating for a long time (fasting). Symptoms may include:  Hunger.  Anxiety.  Sweating and feeling clammy.  Confusion.  Dizziness or feeling light-headed.  Sleepiness.  Nausea.  Increased heart rate.  Headache.  Blurry vision.  Irritability.  Tingling or numbness around the mouth, lips, or tongue.  A change in coordination.  Restless sleep.  Fainting.  Seizure. It is important to know the symptoms of hypoglycemia and treat it right away. Always have a 15-gram rapid-acting carbohydrate snack with you to treat low blood glucose. Family members and close friends should also know the symptoms and should understand how to treat hypoglycemia, in case you are not able to treat yourself. Treating hypoglycemia If you are alert and able to swallow safely, follow the 15:15 rule:  Take 15 grams of a rapid-acting carbohydrate. Talk with your health care provider about how much you should take.  Rapid-acting options include: ? Glucose pills (take 15 grams). ? 6-8 pieces of hard candy. ? 4-6 oz (120-150 mL) of fruit juice. ? 4-6 oz (120-150 mL) of regular (not diet) soda. ? 1 Tbsp (15 mL) honey or sugar.  Check your blood glucose 15 minutes after you take the carbohydrate.  If the repeat blood glucose level is still at or below 70 mg/dL (3.9   mmol/L), take 15 grams of a carbohydrate again.  If your blood glucose level does not increase above 70 mg/dL (3.9 mmol/L) after 3 tries, seek emergency medical care.  After your blood glucose level returns to normal, eat a meal or a snack within 1 hour. Treating severe  hypoglycemia Severe hypoglycemia is when your blood glucose level is at or below 54 mg/dL (3 mmol/L). Severe hypoglycemia is an emergency. Do not wait to see if the symptoms will go away. Get medical help right away. Call your local emergency services (911 in the [place] If you have severe hypoglycemia and you cannot eat or drink, you may need an injection of glucagon. A family member or close friend should learn how to check your blood glucose and how to give you a glucagon injection. Ask your health care provider if you need to have an emergency glucagon injection kit available. Severe hypoglycemia may need to be treated in a hospital. The treatment may include getting glucose through an IV. You may also need treatment for the cause of your hypoglycemia. Follow these instructions at home: Take diabetes medicines as told  If your health care provider prescribed insulin or diabetes medicines, take them every day.  Do not run out of insulin or other diabetes medicines that you take. Plan ahead so you always have these available.  If you use insulin, adjust your dosage based on how physically active you are and what foods you eat. Your health care provider will tell you how to adjust your dosage. Make healthy food choices  The things that you eat and drink affect your blood glucose (and your insulin dose, if this applies). Making good choices helps to control your diabetes and prevent other health problems. A healthy meal plan includes eating lean proteins, complex carbohydrates, fresh fruits and vegetables, low-fat dairy products, and healthy fats. Make an appointment to see a diet and nutrition specialist (registered dietitian) to help you create an eating plan that is right for you. Make sure that you:  Follow instructions from your health care provider about eating or drinking restrictions.  Drink enough fluid to keep your urine pale yellow.  Eat healthy snacks between nutritious meals.  Keep  a record of the carbohydrates that you eat. Do this by reading food labels and learning the standard serving sizes of foods.  Follow your sick day plan whenever you cannot eat or drink as usual. Make this plan in advance with your health care provider.  Stay active  Do 30 or more minutes of physical activity a day, or as much physical activity as your health care provider recommends during your pregnancy. ? Doing 10 minutes of exercise starting 30 minutes after each meal may help to control postprandial blood glucose levels.  If you start a new exercise or activity, work with your health care provider to adjust your insulin, medicines, or food intake as needed. Make healthy lifestyle choices  Do not drink alcohol.  Do not use any tobacco products, such as cigarettes, chewing tobacco, and e-cigarettes. If you need help quitting, ask your health care provider.  Learn to manage stress. If you need help with this, ask your health care provider. Care for your body  Keep your immunizations up to date.  Brush your teeth and gums two times a day, and floss one or more times a day. Visit your dentist one or more times every 6 months.  Maintain a healthy weight during your pregnancy. General instructions  Take over-the-counter and   prescription medicines only as told by your health care provider.  Talk with your health care provider about your risk for high blood pressure during pregnancy (preeclampsia or eclampsia).  Share your diabetes management plan with people in your workplace, school, and household.  Check your urine for ketones during your pregnancy when you are ill and as told by your health care provider.  Carry a medical alert card or wear medical alert jewelry that says you have gestational diabetes.  Keep all follow-up visits during your pregnancy (prenatal) and after delivery (postnatal) as told by your health care provider. This is important. Get the care that  you need after delivery  Have your blood glucose level checked 4-12 weeks after delivery. This is done with an oral glucose tolerance test (OGTT).  Get screened for diabetes at least every 3 years, or as often as told by your health care provider. Questions to ask your health care provider  Do I need to meet with a diabetes educator?  Where can I find a support group for people with gestational diabetes? Where to find more information For more information about gestational diabetes, visit:  American Diabetes Association (ADA): www.diabetes.org  Centers for Disease Control and Prevention (CDC): http://www.wolf.info/ Summary  Check your blood glucose every day during your pregnancy. Do this as often as told by your health care provider.  If your health care provider prescribed insulin or diabetes medicines, take them every day as told.  Keep all follow-up visits during your pregnancy (prenatal) and after delivery (postnatal) as told by your health care provider. This is important.  Have your blood glucose level checked 4-12 weeks after delivery. This information is not intended to replace advice given to you by your health care provider. Make sure you discuss any questions you have with your health care provider. Document Revised: 11/28/2017 Document Reviewed: 07/11/2015 Elsevier Patient Education  [place].   Gestational diabetes  - Discussed ADA diet, in addition for obese patient diagnosed prior to 20 weeks recommend caloric restriction to 25 Kcal/kg/day ideal body weight.  Carbohydrates should be limited to 33-40% of daily caloric intake with attention given to high glycemic index vs low glycemic index foods.  The remaining calories should be split approximately 20% protein, 40% fats.  Three meals a day with two snacks to spread carbohdyrate load was recommended. - Recommend lifestyles referral for more in depth diet teaching - Home glucose monitoring 4 times a day (AM fasting goal  <100, 2-hr postprandial <120).  Importance in maintaining glucose log discussed as proper management will rely on assessing home BG readings.  Stressed team approach with patient being a vital member of this team in ensuring best possible outcome for her fetus. - If good control twice weekly follow up appopriate - Antepartum testing indicated at 32-34 weeks if on meds for glycemic control, monthly growth scan starting at 28 weeks - For diet controlled gestational diabetes induction by 40 weeks if favorable cervix, delivery at latest by 41 weeks - Evidence of fetal macrosomia or poor control may necessitate earlier delivery, 39 weeks or less.  If fetal weight >4500g counseling regarding risk of shoulder dystocia and consideration of C-section discussed. - If pharmacotherapy should need to be started insulin remains the ADA recommended treatment.  Metformin may be used but this indication is not FDA approved, it readily crosses the placenta and while 2-year follow up data have not shown significant developmental differences long term follow up data is unavailable.  Glyburide may  be used as a second line oral agent but is not superior to insulin in preventing macrosomia, may be associated with increased rates of neonatal hypyoglycemia, and is not FDA approved for this indication.  - Stressed that diabetes is a common complicating factor in pregnancy affecting about 7% of pregnancies - Managed properly and with patient adherence to monitoring, diet and lifestyle changes does not have to be associated with adverse pregnancy outcome.  However, uncontrolled may lead to adverse outcomes including still birth, shoulder dystocia, need for cesarean delivery. - Increased risk of preeclampsia in patient diagnosed with gestational diabetes discussed - Increased lifetime risk of develop Type II diabetes discussed  ACOG Practive Bulletin 190 "Gestatioal Diabetes" Updated Februrary 2018

## 2019-08-15 ENCOUNTER — Encounter: Payer: Managed Care, Other (non HMO) | Admitting: Advanced Practice Midwife

## 2019-08-15 ENCOUNTER — Encounter: Payer: Managed Care, Other (non HMO) | Admitting: Obstetrics and Gynecology

## 2019-08-16 ENCOUNTER — Encounter: Payer: Managed Care, Other (non HMO) | Admitting: Dietician

## 2019-08-16 ENCOUNTER — Other Ambulatory Visit: Payer: Self-pay

## 2019-08-16 ENCOUNTER — Encounter: Payer: Self-pay | Admitting: Dietician

## 2019-08-16 ENCOUNTER — Encounter: Payer: Managed Care, Other (non HMO) | Admitting: Obstetrics and Gynecology

## 2019-08-16 VITALS — BP 116/64 | Ht 63.0 in | Wt 232.4 lb

## 2019-08-16 DIAGNOSIS — Z713 Dietary counseling and surveillance: Secondary | ICD-10-CM | POA: Diagnosis not present

## 2019-08-16 DIAGNOSIS — O24410 Gestational diabetes mellitus in pregnancy, diet controlled: Secondary | ICD-10-CM

## 2019-08-16 NOTE — Progress Notes (Signed)
.   Patient's BG record indicates fasting BGs ranging 96-125, and post-meal BGs ranging 101-153 (153 was 1hr after eating). . Patient's food diary indicates meals are generally controlled for carbohydrate, 1-2 of 9 recorded meals maybe above goal for carbs. She is including protein sources with all meals and snacks, and eating vegetables and fruits regularly.   . Provided approx. 1800kcal meal plan, and wrote individualized menus based on patient's food preferences. . Discussed options for evening snack if supper is several hours before bedtime; also encouraged some light activity for about 15 minutes after supper and monitoring effect on post-meal and fasting BGs. . Instructed patient on food safety, including avoidance of Listeriosis, and limiting mercury from fish. . Discussed importance of maintaining healthy lifestyle habits to reduce risk of Type 2 DM as well as Gestational DM with any future pregnancies. . Advised patient to use any remaining testing supplies to test some BGs after delivery, and to have BG tested ideally annually, as well as prior to attempting future pregnancies.

## 2019-08-16 NOTE — Patient Instructions (Signed)
   Include a small bedtime snack with protein and small amount of carbohydrate if supper is more than 2 hours prior to bedtime.   Try light movement/ walking for about 15 minutes after supper and monitor effect on BGs after supper and before breakfast.

## 2019-08-29 ENCOUNTER — Ambulatory Visit (INDEPENDENT_AMBULATORY_CARE_PROVIDER_SITE_OTHER): Payer: Managed Care, Other (non HMO) | Admitting: Obstetrics and Gynecology

## 2019-08-29 ENCOUNTER — Other Ambulatory Visit: Payer: Self-pay

## 2019-08-29 VITALS — BP 128/67 | Wt 232.0 lb

## 2019-08-29 DIAGNOSIS — O99212 Obesity complicating pregnancy, second trimester: Secondary | ICD-10-CM

## 2019-08-29 DIAGNOSIS — O99210 Obesity complicating pregnancy, unspecified trimester: Secondary | ICD-10-CM

## 2019-08-29 DIAGNOSIS — Z3A16 16 weeks gestation of pregnancy: Secondary | ICD-10-CM

## 2019-08-29 DIAGNOSIS — O34211 Maternal care for low transverse scar from previous cesarean delivery: Secondary | ICD-10-CM

## 2019-08-29 DIAGNOSIS — O24319 Unspecified pre-existing diabetes mellitus in pregnancy, unspecified trimester: Secondary | ICD-10-CM

## 2019-08-29 DIAGNOSIS — Z98891 History of uterine scar from previous surgery: Secondary | ICD-10-CM

## 2019-08-29 DIAGNOSIS — O0992 Supervision of high risk pregnancy, unspecified, second trimester: Secondary | ICD-10-CM

## 2019-08-29 DIAGNOSIS — O24312 Unspecified pre-existing diabetes mellitus in pregnancy, second trimester: Secondary | ICD-10-CM

## 2019-08-29 DIAGNOSIS — O0990 Supervision of high risk pregnancy, unspecified, unspecified trimester: Secondary | ICD-10-CM

## 2019-08-29 LAB — POCT URINALYSIS DIPSTICK OB
Glucose, UA: NEGATIVE
POC,PROTEIN,UA: NEGATIVE

## 2019-08-29 MED ORDER — NOVOFINE 32G X 6 MM MISC: 5.0000 [IU] | Freq: Every day | 3 refills | Status: DC

## 2019-08-29 MED ORDER — INSULIN LISPRO (1 UNIT DIAL) 100 UNIT/ML (KWIKPEN): 4.0000 [IU] | PEN_INJECTOR | Freq: Three times a day (TID) | SUBCUTANEOUS | 11 refills | Status: DC

## 2019-08-29 MED ORDER — HUMULIN N KWIKPEN 100 UNIT/ML ~~LOC~~ SUPN: 10.0000 [IU] | PEN_INJECTOR | Freq: Two times a day (BID) | SUBCUTANEOUS | 11 refills | Status: DC

## 2019-08-29 MED ORDER — SERTRALINE HCL 50 MG PO TABS: 50.0000 mg | ORAL_TABLET | Freq: Every day | ORAL | 2 refills | Status: DC

## 2019-08-29 NOTE — Progress Notes (Signed)
ROB

## 2019-08-29 NOTE — Progress Notes (Signed)
Routine Prenatal Care Visit  Subjective  Shelby Aguilar is a 34 y.o. G3P2002 at [redacted]w[redacted]d being seen today for ongoing prenatal care.  She is currently monitored for the following issues for this high-risk pregnancy and has GOITER, UNSPECIFIED; PCOD (polycystic ovarian disease); Obesity affecting pregnancy, antepartum; Supervision of high risk pregnancy, antepartum; History of 2 cesarean sections; BMI 39.0-39.9,adult; and Pregestational diabetes mellitus, modified White class B on their problem list.  ----------------------------------------------------------------------------------- Patient reports no complaints.   Contractions: Not present. Vag. Bleeding: None.  Movement: Absent. Denies leaking of fluid.  ----------------------------------------------------------------------------------- The following portions of the patient's history were reviewed and updated as appropriate: allergies, current medications, past family history, past medical history, past social history, past surgical history and problem list. Problem list updated.   Objective  Blood pressure 128/67, weight 232 lb (105.2 kg), last menstrual period 04/23/2019, currently breastfeeding. Pregravid weight 220 lb (99.8 kg) Total Weight Gain 12 lb (5.443 kg) Urinalysis:      Fetal Status: Fetal Heart Rate (bpm): 150   Movement: Absent     General:  Alert, oriented and cooperative. Patient is in no acute distress.  Skin: Skin is warm and dry. No rash noted.   Cardiovascular: Normal heart rate noted  Respiratory: Normal respiratory effort, no problems with respiration noted  Abdomen: Soft, gravid, appropriate for gestational age. Pain/Pressure: Absent     Pelvic:  Cervical exam deferred        Extremities: Normal range of motion.     ental Status: Normal mood and affect. Normal behavior. Normal judgment and thought content.     Assessment   34 y.o. [id number] at [redacted]w[redacted]d by  02/11/2020, by Ultrasound presenting for routine  prenatal visit  Plan   pregnancy Problems (from 04/23/19 to present)    Problem Noted Resolved   BMI 39.0-39.9,adult 07/19/2019 by Will Bonnet, MD No   History of 2 cesarean sections 07/10/2019 by Gae Dry, MD No   Obesity affecting pregnancy, antepartum 11/19/2017 by Rod Can, CNM No   Overview Signed 07/10/2019  2:47 PM by Gae Dry, MD    BMI >=40 [ ]  early 1h gtt -  [ ]  u/s for dating [ ]   [ ]  nutritional goals [ ]  folic acid 1mg  [ ]  bASA (>12 weeks) [ ]  consider nutrition consult [ ]  consider maternal EKG 1st trimester [ ]  Growth u/s 28 [ ] , 32 [ ] , 36 weeks [ ]  [ ]  NST/AFI weekly 36+ weeks (36[] , 37[] , 38[] )       Supervision of high risk pregnancy, antepartum 11/19/2017 by Rod Can, CNM No   Overview Addendum 08/15/2019  2:01 PM by Malachy Mood, MD    [facility] Prenatal Labs  Dating 10 week [place] Blood type: A/Positive/-- (01/19 1452)   Genetic Screen  NIPS: XX Antibody:Negative (01/19 1452)  Anatomic [place]  Rubella: 10.70 (01/19 1452) Varicella: Imm  GTT Early: 147 3 hr GTT 103  / 189 / 160 /149 RPR: Non Reactive (01/19 1452)   Rhogam N/A HBsAg: Negative (01/19 1452)   TDaP vaccine          Flu Shot:03/2019 HIV: Non Reactive (01/19 1452)   Baby Food     Breast                           GBS:   Contraception  Pap:07/10/19  CBB  No   CS/VBAC CS x2, plans CS   Support Person  Husband Montegus                Gestational age appropriate obstetric precautions including but not limited to vaginal bleeding, contractions, leaking of fluid and fetal movement were reviewed in detail with the patient.    - BG log reviewed, significantly elevated fasting BG reading.  The postprandials are mostly at goal - start insulin verify insurance coverage NPH 10U BID and fast acting 4U AC TID - restart Zoloft 50mg  rx  Return in about 1 week (around 09/05/2019) for ROB.  09/07/2019, MD, Vena Austria [facility], [facility] 09/03/2019,  10:00 PM

## 2019-09-05 ENCOUNTER — Ambulatory Visit (INDEPENDENT_AMBULATORY_CARE_PROVIDER_SITE_OTHER): Payer: Managed Care, Other (non HMO) | Admitting: Obstetrics and Gynecology

## 2019-09-05 ENCOUNTER — Other Ambulatory Visit: Payer: Self-pay

## 2019-09-05 VITALS — BP 126/68 | Wt 238.0 lb

## 2019-09-05 DIAGNOSIS — O24319 Unspecified pre-existing diabetes mellitus in pregnancy, unspecified trimester: Secondary | ICD-10-CM

## 2019-09-05 DIAGNOSIS — O24312 Unspecified pre-existing diabetes mellitus in pregnancy, second trimester: Secondary | ICD-10-CM

## 2019-09-05 DIAGNOSIS — Z3A17 17 weeks gestation of pregnancy: Secondary | ICD-10-CM

## 2019-09-05 DIAGNOSIS — O34211 Maternal care for low transverse scar from previous cesarean delivery: Secondary | ICD-10-CM

## 2019-09-05 DIAGNOSIS — O99212 Obesity complicating pregnancy, second trimester: Secondary | ICD-10-CM

## 2019-09-05 DIAGNOSIS — Z363 Encounter for antenatal screening for malformations: Secondary | ICD-10-CM

## 2019-09-05 DIAGNOSIS — O99210 Obesity complicating pregnancy, unspecified trimester: Secondary | ICD-10-CM

## 2019-09-05 DIAGNOSIS — O0990 Supervision of high risk pregnancy, unspecified, unspecified trimester: Secondary | ICD-10-CM

## 2019-09-05 DIAGNOSIS — O0992 Supervision of high risk pregnancy, unspecified, second trimester: Secondary | ICD-10-CM

## 2019-09-05 DIAGNOSIS — Z98891 History of uterine scar from previous surgery: Secondary | ICD-10-CM

## 2019-09-05 LAB — POCT URINALYSIS DIPSTICK OB
Glucose, UA: NEGATIVE
POC,PROTEIN,UA: NEGATIVE

## 2019-09-05 NOTE — Progress Notes (Signed)
Routine Prenatal Care Visit  Subjective  Shelby Aguilar is a 34 y.o. G3P2002 at [redacted]w[redacted]d being seen today for ongoing prenatal care.  She is currently monitored for the following issues for this high-risk pregnancy and has GOITER, UNSPECIFIED; PCOD (polycystic ovarian disease); Obesity affecting pregnancy, antepartum; Supervision of high risk pregnancy, antepartum; History of 2 cesarean sections; BMI 39.0-39.9,adult; and Pregestational diabetes mellitus, modified White class B on their problem list.  ----------------------------------------------------------------------------------- Patient reports no complaints.   Contractions: Not present. Vag. Bleeding: None.  Movement: Present. Denies leaking of fluid.  ----------------------------------------------------------------------------------- The following portions of the patient's history were reviewed and updated as appropriate: allergies, current medications, past family history, past medical history, past social history, past surgical history and problem list. Problem list updated.   Objective  Blood pressure 126/68, weight 238 lb (108 kg), last menstrual period 04/23/2019, currently breastfeeding. Pregravid weight 220 lb (99.8 kg) Total Weight Gain 18 lb (8.165 kg) Urinalysis:      Fetal Status: Fetal Heart Rate (bpm): 145   Movement: Present     General:  Alert, oriented and cooperative. Patient is in no acute distress.  Skin: Skin is warm and dry. No rash noted.   Cardiovascular: Normal heart rate noted  Respiratory: Normal respiratory effort, no problems with respiration noted  Abdomen: Soft, gravid, appropriate for gestational age. Pain/Pressure: Absent     Pelvic:  Cervical exam deferred        Extremities: Normal range of motion.     ental Status: Normal mood and affect. Normal behavior. Normal judgment and thought content.     Assessment   34 y.o. [id number] at [redacted]w[redacted]d by  02/11/2020, by Ultrasound presenting for routine  prenatal visit  Plan   pregnancy Problems (from 04/23/19 to present)    Problem Noted Resolved   BMI 39.0-39.9,adult 07/19/2019 by Will Bonnet, MD No   History of 2 cesarean sections 07/10/2019 by Gae Dry, MD No   Obesity affecting pregnancy, antepartum 11/19/2017 by Rod Can, CNM No   Overview Signed 07/10/2019  2:47 PM by Gae Dry, MD    BMI >=40 [ ]  early 1h gtt -  [ ]  u/s for dating [ ]   [ ]  nutritional goals [ ]  folic acid 1mg  [ ]  bASA (>12 weeks) [ ]  consider nutrition consult [ ]  consider maternal EKG 1st trimester [ ]  Growth u/s 28 [ ] , 32 [ ] , 36 weeks [ ]  [ ]  NST/AFI weekly 36+ weeks (36[] , 37[] , 38[] )       Supervision of high risk pregnancy, antepartum 11/19/2017 by Rod Can, CNM No   Overview Addendum 08/15/2019  2:01 PM by Malachy Mood, MD    [facility] Prenatal Labs  Dating 10 week [place] Blood type: A/Positive/-- (01/19 1452)   Genetic Screen  NIPS: XX Antibody:Negative (01/19 1452)  Anatomic [place]  Rubella: 10.70 (01/19 1452) Varicella: Imm  GTT Early: 147 3 hr GTT 103  / 189 / 160 /149 RPR: Non Reactive (01/19 1452)   Rhogam N/A HBsAg: Negative (01/19 1452)   TDaP vaccine          Flu Shot:03/2019 HIV: Non Reactive (01/19 1452)   Baby Food     Breast                           GBS:   Contraception  Pap:07/10/19  CBB  No   CS/VBAC CS x2, plans CS   Support Person  Husband Montegus                Gestational age appropriate obstetric precautions including but not limited to vaginal bleeding, contractions, leaking of fluid and fetal movement were reviewed in detail with the patient.    - increase humulin-N to 15 units bedtime, 10 units AM, meal time short acting remains at 4-4-4  Return in about 1 week (around 09/12/2019) for 1 week ROB, 2 week ROB and anatomy scan.  Vena Austria, MD, [facility], [facility] 09/05/2019, 4:29 PM

## 2019-09-05 NOTE — Progress Notes (Signed)
ROB

## 2019-09-13 ENCOUNTER — Other Ambulatory Visit: Payer: Self-pay

## 2019-09-13 ENCOUNTER — Ambulatory Visit (INDEPENDENT_AMBULATORY_CARE_PROVIDER_SITE_OTHER): Payer: Managed Care, Other (non HMO) | Admitting: Obstetrics and Gynecology

## 2019-09-13 ENCOUNTER — Encounter: Payer: Self-pay | Admitting: Obstetrics and Gynecology

## 2019-09-13 VITALS — BP 121/68 | Wt 238.0 lb

## 2019-09-13 DIAGNOSIS — O34211 Maternal care for low transverse scar from previous cesarean delivery: Secondary | ICD-10-CM

## 2019-09-13 DIAGNOSIS — Z98891 History of uterine scar from previous surgery: Secondary | ICD-10-CM

## 2019-09-13 DIAGNOSIS — O0990 Supervision of high risk pregnancy, unspecified, unspecified trimester: Secondary | ICD-10-CM

## 2019-09-13 DIAGNOSIS — O99210 Obesity complicating pregnancy, unspecified trimester: Secondary | ICD-10-CM

## 2019-09-13 DIAGNOSIS — O24312 Unspecified pre-existing diabetes mellitus in pregnancy, second trimester: Secondary | ICD-10-CM

## 2019-09-13 DIAGNOSIS — Z6839 Body mass index (BMI) 39.0-39.9, adult: Secondary | ICD-10-CM

## 2019-09-13 DIAGNOSIS — O99212 Obesity complicating pregnancy, second trimester: Secondary | ICD-10-CM

## 2019-09-13 DIAGNOSIS — O0992 Supervision of high risk pregnancy, unspecified, second trimester: Secondary | ICD-10-CM

## 2019-09-13 DIAGNOSIS — O24319 Unspecified pre-existing diabetes mellitus in pregnancy, unspecified trimester: Secondary | ICD-10-CM

## 2019-09-13 DIAGNOSIS — Z3A18 18 weeks gestation of pregnancy: Secondary | ICD-10-CM

## 2019-09-13 NOTE — Progress Notes (Signed)
Routine Prenatal Care Visit  Subjective  Shelby Aguilar is a 34 y.o. G3P2002 at [redacted]w[redacted]d being seen today for ongoing prenatal care.  She is currently monitored for the following issues for this high-risk pregnancy and has GOITER, UNSPECIFIED; PCOD (polycystic ovarian disease); Obesity affecting pregnancy, antepartum; Supervision of high risk pregnancy, antepartum; History of 2 cesarean sections; BMI 39.0-39.9,adult; and Pregestational diabetes mellitus, modified White class B on their problem list.  ----------------------------------------------------------------------------------- Patient reports no complaints.   Contractions: Not present. Vag. Bleeding: None.  Movement: Present. Leaking Fluid denies.  DM: all fastings upper 90s to low 100s, most pp breakfast and dinner (incomplete) normal, >50% lunch pp greater than target. ----------------------------------------------------------------------------------- The following portions of the patient's history were reviewed and updated as appropriate: allergies, current medications, past family history, past medical history, past social history, past surgical history and problem list. Problem list updated.  Objective  Blood pressure 121/68, weight 238 lb (108 kg), last menstrual period 04/23/2019, currently breastfeeding. Pregravid weight 220 lb (99.8 kg) Total Weight Gain 18 lb (8.165 kg) Urinalysis: Urine Protein    Urine Glucose    Fetal Status: Fetal Heart Rate (bpm): 150   Movement: Present     General:  Alert, oriented and cooperative. Patient is in no acute distress.  Skin: Skin is warm and dry. No rash noted.   Cardiovascular: Normal heart rate noted  Respiratory: Normal respiratory effort, no problems with respiration noted  Abdomen: Soft, gravid, appropriate for gestational age. Pain/Pressure: Absent     Pelvic:  Cervical exam deferred        Extremities: Normal range of motion.     Mental Status: Normal mood and affect. Normal  behavior. Normal judgment and thought content.   Assessment   34 y.o. [id number] at [redacted]w[redacted]d by  02/11/2020, by Ultrasound presenting for routine prenatal visit  Plan   pregnancy Problems (from 04/23/19 to present)    Problem Noted Resolved   BMI 39.0-39.9,adult 07/19/2019 by Conard Novak, MD No   History of 2 cesarean sections 07/10/2019 by Nadara Mustard, MD No   Obesity affecting pregnancy, antepartum 11/19/2017 by Tresea Mall, CNM No   Overview Signed 07/10/2019  2:47 PM by Nadara Mustard, MD    BMI >=40 [ ]  early 1h gtt -  [ ]  u/s for dating [ ]   [ ]  nutritional goals [ ]  folic acid 1mg  [ ]  bASA (>12 weeks) [ ]  consider nutrition consult [ ]  consider maternal EKG 1st trimester [ ]  Growth u/s 28 [ ] , 32 [ ] , 36 weeks [ ]  [ ]  NST/AFI weekly 36+ weeks (36[] , 37[] , 38[] )       Supervision of high risk pregnancy, antepartum 11/19/2017 by , CNM No   Overview Addendum 08/15/2019  2:01 PM by , MD    Clinic [facility] Prenatal Labs  Dating 10 week Blood type: A/Positive/-- (01/19 1452)   Genetic Screen  NIPS: XX Antibody:Negative (01/19 1452)  Anatomic  Rubella: 10.70 (01/19 1452) Varicella: Imm  GTT Early: 147 3 hr GTT 103  / 189 / 160 /149 RPR: Non Reactive (01/19 1452)   Rhogam N/A HBsAg: Negative (01/19 1452)   TDaP vaccine          Flu Shot:03/2019 HIV: Non Reactive (01/19 1452)   Baby Food     Breast                           GBS:  Contraception  Pap:07/10/19  CBB  No   CS/VBAC CS x2, plans CS   Support Person Husband Montegus                Preterm labor symptoms and general obstetric precautions including but not limited to vaginal bleeding, contractions, leaking of fluid and fetal movement were reviewed in detail with the patient. Please refer to After Visit Summary for other counseling recommendations.   - increase NPH: AM 15 units, QHS 20 units. -short acting: continued 4 units with each meal.  Return in about 1 week  (around 09/20/2019) for [place] previously scheduled appt.  Prentice Docker, MD, [facility], [facility] 09/13/2019 9:18 AM

## 2019-09-20 ENCOUNTER — Other Ambulatory Visit: Payer: Managed Care, Other (non HMO)

## 2019-09-20 ENCOUNTER — Encounter: Payer: Managed Care, Other (non HMO) | Admitting: Obstetrics and Gynecology

## 2019-09-21 ENCOUNTER — Other Ambulatory Visit: Payer: Self-pay | Admitting: Obstetrics and Gynecology

## 2019-10-02 ENCOUNTER — Ambulatory Visit (INDEPENDENT_AMBULATORY_CARE_PROVIDER_SITE_OTHER): Payer: Managed Care, Other (non HMO) | Admitting: Obstetrics & Gynecology

## 2019-10-02 ENCOUNTER — Ambulatory Visit (INDEPENDENT_AMBULATORY_CARE_PROVIDER_SITE_OTHER): Payer: Managed Care, Other (non HMO)

## 2019-10-02 ENCOUNTER — Encounter: Payer: Self-pay | Admitting: Obstetrics & Gynecology

## 2019-10-02 ENCOUNTER — Other Ambulatory Visit: Payer: Self-pay

## 2019-10-02 VITALS — BP 100/60 | Wt 232.0 lb

## 2019-10-02 DIAGNOSIS — O24312 Unspecified pre-existing diabetes mellitus in pregnancy, second trimester: Secondary | ICD-10-CM | POA: Diagnosis not present

## 2019-10-02 DIAGNOSIS — O99210 Obesity complicating pregnancy, unspecified trimester: Secondary | ICD-10-CM

## 2019-10-02 DIAGNOSIS — O24319 Unspecified pre-existing diabetes mellitus in pregnancy, unspecified trimester: Secondary | ICD-10-CM

## 2019-10-02 DIAGNOSIS — O0992 Supervision of high risk pregnancy, unspecified, second trimester: Secondary | ICD-10-CM

## 2019-10-02 DIAGNOSIS — Z98891 History of uterine scar from previous surgery: Secondary | ICD-10-CM

## 2019-10-02 DIAGNOSIS — Z3A21 21 weeks gestation of pregnancy: Secondary | ICD-10-CM

## 2019-10-02 DIAGNOSIS — O99212 Obesity complicating pregnancy, second trimester: Secondary | ICD-10-CM

## 2019-10-02 DIAGNOSIS — Z363 Encounter for antenatal screening for malformations: Secondary | ICD-10-CM

## 2019-10-02 DIAGNOSIS — Z3A20 20 weeks gestation of pregnancy: Secondary | ICD-10-CM | POA: Diagnosis not present

## 2019-10-02 DIAGNOSIS — O0990 Supervision of high risk pregnancy, unspecified, unspecified trimester: Secondary | ICD-10-CM

## 2019-10-02 LAB — POCT URINALYSIS DIPSTICK OB
Glucose, UA: NEGATIVE
POC,PROTEIN,UA: NEGATIVE

## 2019-10-02 MED ORDER — ALBUTEROL SULFATE HFA 108 (90 BASE) MCG/ACT IN AERS: 2.0000 | INHALATION_SPRAY | RESPIRATORY_TRACT | 2 refills | Status: DC | PRN

## 2019-10-02 NOTE — Patient Instructions (Signed)

## 2019-10-02 NOTE — Addendum Note (Signed)
Addended by: Cornelius Moras D on: 10/02/2019 02:21 PM   Modules accepted: Orders

## 2019-10-02 NOTE — Progress Notes (Signed)
Prenatal Visit Note Date: 10/02/2019 Clinic: [facility]  Subjective:  Shelby Aguilar is a 34 y.o. G3P2002 at [redacted]w[redacted]d being seen today for ongoing prenatal care.  She is currently monitored for the following issues for this high-risk pregnancy and has GOITER, UNSPECIFIED; PCOD (polycystic ovarian disease); Obesity affecting pregnancy, antepartum; Supervision of high risk pregnancy, antepartum; History of 2 cesarean sections; BMI 39.0-39.9,adult; and Pregestational diabetes mellitus, modified White class B on their problem list.  Patient reports wheezing at times at night, related to allergies; epigastric muscle pains, BS as described bellow, no SE to Insulin.   Contractions: Not present. Vag. Bleeding: None.  Movement: Present. Denies leaking of fluid.   FBS 100-105, PPBS 100-130 (<50% abnormal)  The following portions of the patient's history were reviewed and updated as appropriate: allergies, current medications, past family history, past medical history, past social history, past surgical history and problem list. Problem list updated.  Objective:   Vitals:   10/02/19 1341  BP: 100/60  Weight: 232 lb (105.2 kg)    Fetal Status:     Movement: Present     General:  Alert, oriented and cooperative. Patient is in no acute distress.  Skin: Skin is warm and dry. No rash noted.   Cardiovascular: Normal heart rate noted  Respiratory: Normal respiratory effort, no problems with respiration noted  Abdomen: Soft, gravid, appropriate for gestational age. Pain/Pressure: Present     Pelvic:  Cervical exam deferred        Extremities: Normal range of motion.     Mental Status: Normal mood and affect. Normal behavior. Normal judgment and thought content.   Urinalysis:      Assessment and Plan:  Pregnancy: G3P2002 at [redacted]w[redacted]d  1. [redacted] weeks gestation of pregnancy PNV  2. Supervision of high risk pregnancy in second trimester Review of ULTRASOUND. I have personally reviewed images and report  of recent ultrasound done at [facility]. There is a singleton gestation with subjectively normal amniotic fluid volume. The fetal biometry correlates with established dating. Detailed evaluation of the fetal anatomy was performed.The fetal anatomical survey appears within normal limits within the resolution of ultrasound as described above.  It must be noted that a normal ultrasound is unable to rule out fetal aneuploidy.    3. Obesity affecting pregnancy in second trimester APT and growth [place] third trimester  4. History of 2 cesarean sections Plan CS, 37 or 39 weeks based on glc control  5. Pregestational diabetes mellitus, modified White class B Insulin, adjust dose of evening NPH to 25 U  Preterm labor symptoms and general obstetric precautions including but not limited to vaginal bleeding, contractions, leaking of fluid and fetal movement were reviewed in detail with the patient. Please refer to After Visit Summary for other counseling recommendations.   pregnancy Problems (from 04/23/19 to present)    Problem Noted Resolved   BMI 39.0-39.9,adult 07/19/2019 by Conard Novak, MD No   History of 2 cesarean sections 07/10/2019 by Nadara Mustard, MD No   Obesity affecting pregnancy, antepartum 11/19/2017 by Tresea Mall, CNM No   Overview Signed 07/10/2019  2:47 PM by Nadara Mustard, MD    BMI >=40 [ ]  early 1h gtt -  [ ]  u/s for dating [ ]   [ ]  nutritional goals [ ]  folic acid 1mg  [ ]  bASA (>12 weeks) [ ]  consider nutrition consult [ ]  consider maternal EKG 1st trimester [ ]  Growth u/s 28 [ ] , 32 [ ] , 36 weeks [ ]  [ ]   NST/AFI weekly 36+ weeks (36[] , 37[] , 38[] )       Supervision of high risk pregnancy, antepartum 11/19/2017 by Rod Can, CNM No   Overview Addendum 08/15/2019  2:01 PM by Malachy Mood, MD    [facility]inic [facility] Prenatal Labs  Dating 10 week [place] Blood type: A/Positive/-- (01/19 1452)   Genetic Screen  NIPS: XX Antibody:Negative (01/19 1452)  Anatomic [place]   Rubella: 10.70 (01/19 1452) Varicella: Imm  GTT Early: 147 3 hr GTT 103  / 189 / 160 /149 RPR: Non Reactive (01/19 1452)   Rhogam N/A HBsAg: Negative (01/19 1452)   TDaP vaccine          Flu Shot:03/2019 HIV: Non Reactive (01/19 1452)   Baby Food     Breast                           GBS:   Contraception  Pap:07/10/19  CBB  No   CS/VBAC CS x2, plans CS   Support Person Husband Montegus                Return in about 2 weeks (around 10/16/2019) for [place].  Barnett Applebaum, MD, [facility], [facility] 10/02/2019  2:14 PM

## 2019-10-16 ENCOUNTER — Other Ambulatory Visit: Payer: Self-pay

## 2019-10-16 ENCOUNTER — Encounter: Payer: Self-pay | Admitting: Obstetrics and Gynecology

## 2019-10-16 ENCOUNTER — Ambulatory Visit (INDEPENDENT_AMBULATORY_CARE_PROVIDER_SITE_OTHER): Payer: Managed Care, Other (non HMO) | Admitting: Obstetrics and Gynecology

## 2019-10-16 VITALS — BP 132/78 | Wt 233.0 lb

## 2019-10-16 DIAGNOSIS — O0993 Supervision of high risk pregnancy, unspecified, third trimester: Secondary | ICD-10-CM

## 2019-10-16 DIAGNOSIS — O24319 Unspecified pre-existing diabetes mellitus in pregnancy, unspecified trimester: Secondary | ICD-10-CM

## 2019-10-16 DIAGNOSIS — N912 Amenorrhea, unspecified: Secondary | ICD-10-CM | POA: Insufficient documentation

## 2019-10-16 DIAGNOSIS — Z3A23 23 weeks gestation of pregnancy: Secondary | ICD-10-CM

## 2019-10-16 DIAGNOSIS — Z98891 History of uterine scar from previous surgery: Secondary | ICD-10-CM

## 2019-10-16 DIAGNOSIS — O0992 Supervision of high risk pregnancy, unspecified, second trimester: Secondary | ICD-10-CM

## 2019-10-16 DIAGNOSIS — O24312 Unspecified pre-existing diabetes mellitus in pregnancy, second trimester: Secondary | ICD-10-CM

## 2019-10-16 LAB — POCT URINALYSIS DIPSTICK OB
Glucose, UA: NEGATIVE
POC,PROTEIN,UA: NEGATIVE

## 2019-10-16 NOTE — Progress Notes (Signed)
Routine Prenatal Care Visit  Subjective  Shelby Aguilar is a 34 y.o. G3P2002 at [redacted]w[redacted]d being seen today for ongoing prenatal care.  She is currently monitored for the following issues for this low-risk pregnancy and has GOITER, UNSPECIFIED; PCOD (polycystic ovarian disease); Obesity affecting pregnancy, antepartum; Supervision of high risk pregnancy, antepartum; History of 2 cesarean sections; BMI 39.0-39.9,adult; Pregestational diabetes mellitus, modified White class B; and Amenorrhea on their problem list.  ----------------------------------------------------------------------------------- Patient reports no complaints.   Contractions: Not present. Vag. Bleeding: None.  Movement: Present. Denies leaking of fluid.  ----------------------------------------------------------------------------------- The following portions of the patient's history were reviewed and updated as appropriate: allergies, current medications, past family history, past medical history, past social history, past surgical history and problem list. Problem list updated.   Objective  Blood pressure 132/78, weight 233 lb (105.7 kg), last menstrual period 04/23/2019, currently breastfeeding. Pregravid weight 220 lb (99.8 kg) Total Weight Gain 13 lb (5.897 kg) Urinalysis:      Fetal Status:     Movement: Present     General:  Alert, oriented and cooperative. Patient is in no acute distress.  Skin: Skin is warm and dry. No rash noted.   Cardiovascular: Normal heart rate noted  Respiratory: Normal respiratory effort, no problems with respiration noted  Abdomen: Soft, gravid, appropriate for gestational age. Pain/Pressure: Present     Pelvic:  Cervical exam deferred        Extremities: Normal range of motion.     Mental Status: Normal mood and affect. Normal behavior. Normal judgment and thought content.       Assessment   34 y.o. G3P2002 at [redacted]w[redacted]d by  02/11/2020, by Ultrasound presenting for routine prenatal  visit  Plan   pregnancy Problems (from 04/23/19 to present)    Problem Noted Resolved   BMI 39.0-39.9,adult 07/19/2019 by Will Bonnet, MD No   History of 2 cesarean sections 07/10/2019 by Gae Dry, MD No   Obesity affecting pregnancy, antepartum 11/19/2017 by Rod Can, CNM No   Overview Signed 07/10/2019  2:47 PM by Gae Dry, MD    BMI >=40 [ ]  early 1h gtt -  [ ]  u/s for dating [ ]   [ ]  nutritional goals [ ]  folic acid 1mg  [ ]  bASA (>12 weeks) [ ]  consider nutrition consult [ ]  consider maternal EKG 1st trimester [ ]  Growth u/s 28 [ ] , 32 [ ] , 36 weeks [ ]  [ ]  NST/AFI weekly 36+ weeks (36[] , 37[] , 38[] )       Supervision of high risk pregnancy, antepartum 11/19/2017 by Rod Can, CNM No   Overview Addendum 08/15/2019  2:01 PM by Malachy Mood, MD    [facility]inic [facility] Prenatal Labs  Dating 10 week [place] Blood type: A/Positive/-- (01/19 1452)   Genetic Screen  NIPS: XX Antibody:Negative (01/19 1452)  Anatomic [place]  Rubella: 10.70 (01/19 1452) Varicella: Imm  GTT Early: 147 3 hr GTT 103  / 189 / 160 /149 RPR: Non Reactive (01/19 1452)   Rhogam N/A HBsAg: Negative (01/19 1452)   TDaP vaccine          Flu Shot:03/2019 HIV: Non Reactive (01/19 1452)   Baby Food     Breast                           GBS:   Contraception  Pap:07/10/19  CBB  No   CS/VBAC CS x2, plans CS   Support Person  Husband Montegus               Increase evening NPH to 30 units.  Advised to take 6 units with meals if she is eating something card heavy.  Plan growth Korea for 28 weeks.   Gestational age appropriate obstetric precautions including but not limited to vaginal bleeding, contractions, leaking of fluid and fetal movement were reviewed in detail with the patient.    Return in about 2 weeks (around 10/30/2019) for [place].  Natale Milch MD [facility], [facility] 10/16/2019, 5:07 PM

## 2019-10-17 ENCOUNTER — Other Ambulatory Visit: Payer: Self-pay | Admitting: Obstetrics and Gynecology

## 2019-10-17 DIAGNOSIS — O24319 Unspecified pre-existing diabetes mellitus in pregnancy, unspecified trimester: Secondary | ICD-10-CM

## 2019-10-17 MED ORDER — INSULIN ISOPHANE HUMAN 100 UNIT/ML KWIKPEN: PEN_INJECTOR | SUBCUTANEOUS | 11 refills | Status: DC

## 2019-10-30 ENCOUNTER — Other Ambulatory Visit: Payer: Self-pay

## 2019-10-30 ENCOUNTER — Ambulatory Visit (INDEPENDENT_AMBULATORY_CARE_PROVIDER_SITE_OTHER): Payer: Managed Care, Other (non HMO) | Admitting: Obstetrics & Gynecology

## 2019-10-30 ENCOUNTER — Encounter: Payer: Self-pay | Admitting: Obstetrics & Gynecology

## 2019-10-30 VITALS — BP 120/70 | Wt 235.0 lb

## 2019-10-30 DIAGNOSIS — Z3A25 25 weeks gestation of pregnancy: Secondary | ICD-10-CM

## 2019-10-30 DIAGNOSIS — O99212 Obesity complicating pregnancy, second trimester: Secondary | ICD-10-CM

## 2019-10-30 DIAGNOSIS — Z98891 History of uterine scar from previous surgery: Secondary | ICD-10-CM

## 2019-10-30 DIAGNOSIS — O0992 Supervision of high risk pregnancy, unspecified, second trimester: Secondary | ICD-10-CM

## 2019-10-30 DIAGNOSIS — O24319 Unspecified pre-existing diabetes mellitus in pregnancy, unspecified trimester: Secondary | ICD-10-CM

## 2019-10-30 LAB — POCT URINALYSIS DIPSTICK OB
Glucose, UA: NEGATIVE
POC,PROTEIN,UA: NEGATIVE

## 2019-10-30 NOTE — Progress Notes (Signed)
  Subjective  Fetal Movement? yes Contractions? no Leaking Fluid? no Vaginal Bleeding? no  BS log- FBS 50% normal, 2 hour PPBS 30% normal Pt reports no symptoms of concern related to diabetes Tolerates monitoring, just has scheduling and rememberence issues with checking regularly and at the right times She is compliant with bringing her log and doing her best Follows healthy diet   Objective  BP 120/70   Wt 235 lb (106.6 kg)   LMP 04/23/2019   BMI 41.63 kg/m  General: NAD Pumonary: no increased work of breathing Abdomen: gravid, non-tender Extremities: no edema Psychiatric: mood appropriate, affect full  Assessment  34 y.o. G3P2002 at [redacted]w[redacted]d by  02/11/2020, by Ultrasound presenting for routine prenatal visit  Plan   Problem List Items Addressed This Visit    Pregestational diabetes mellitus, modified White class B   Relevant Orders   US OB Follow Up nv for growth Also has projected macrosomia    Prior newborn weights 8 and 9 lbs    FH > dates Adjust Insulin dosing today    Am- 20 U, Pm- 35 U, Meal coverage 6/6/6   History of 2 cesarean sections    Plans CS likely 37 weeks   [redacted] weeks gestation of pregnancy    -  Primary   Relevant Orders   POC Urinalysis Dipstick OB (Completed) PNV [facility] PTL precautions   Supervision of high risk pregnancy in second trimester       Obesity affecting pregnancy in second trimester           [facility]inic [facility] Prenatal Labs  Dating 10 week [place] Blood type: A/Positive/-- (01/19 1452)   Genetic Screen  NIPS: XX Antibody:Negative (01/19 1452)  Anatomic [place] [facility] nml Rubella: 10.70 (01/19 1452) Varicella: Imm  GTT Early: 147 3 hr GTT 103  / 189 / 160 /149 RPR: Non Reactive (01/19 1452)   Rhogam N/A HBsAg: Negative (01/19 1452)   TDaP vaccine          Flu Shot:03/2019 HIV: Non Reactive (01/19 1452)   Baby Food     Breast                           GBS:   Contraception  Pap:07/10/19  CBB  No   CS/VBAC CS x2, plans CS   Support Person Husband  Montegus                Annamarie Major, MD, [facility], [facility] 10/30/2019  8:43 AM

## 2019-11-09 ENCOUNTER — Ambulatory Visit
Admission: EM | Admit: 2019-11-09 | Discharge: 2019-11-09 | Disposition: A | Discharge status: Home / Self Care | Payer: Managed Care, Other (non HMO) | Attending: Family Medicine | Admitting: Family Medicine

## 2019-11-09 ENCOUNTER — Ambulatory Visit (INDEPENDENT_AMBULATORY_CARE_PROVIDER_SITE_OTHER)
Admission: RE | Admit: 2019-11-09 | Discharge: 2019-11-09 | Disposition: A | Discharge status: Home / Self Care | Payer: Managed Care, Other (non HMO) | Source: Ambulatory Visit

## 2019-11-09 ENCOUNTER — Encounter: Payer: Self-pay | Admitting: Emergency Medicine

## 2019-11-09 ENCOUNTER — Other Ambulatory Visit: Payer: Self-pay

## 2019-11-09 DIAGNOSIS — Z7951 Long term (current) use of inhaled steroids: Secondary | ICD-10-CM | POA: Insufficient documentation

## 2019-11-09 DIAGNOSIS — R05 Cough: Secondary | ICD-10-CM | POA: Insufficient documentation

## 2019-11-09 DIAGNOSIS — O24419 Gestational diabetes mellitus in pregnancy, unspecified control: Secondary | ICD-10-CM | POA: Insufficient documentation

## 2019-11-09 DIAGNOSIS — Z833 Family history of diabetes mellitus: Secondary | ICD-10-CM | POA: Diagnosis not present

## 2019-11-09 DIAGNOSIS — J4550 Severe persistent asthma, uncomplicated: Secondary | ICD-10-CM | POA: Diagnosis not present

## 2019-11-09 DIAGNOSIS — J029 Acute pharyngitis, unspecified: Secondary | ICD-10-CM | POA: Insufficient documentation

## 2019-11-09 DIAGNOSIS — Z79899 Other long term (current) drug therapy: Secondary | ICD-10-CM | POA: Diagnosis not present

## 2019-11-09 DIAGNOSIS — O99519 Diseases of the respiratory system complicating pregnancy, unspecified trimester: Secondary | ICD-10-CM | POA: Diagnosis not present

## 2019-11-09 DIAGNOSIS — J069 Acute upper respiratory infection, unspecified: Secondary | ICD-10-CM | POA: Diagnosis not present

## 2019-11-09 DIAGNOSIS — Z794 Long term (current) use of insulin: Secondary | ICD-10-CM | POA: Diagnosis not present

## 2019-11-09 DIAGNOSIS — Z20822 Contact with and (suspected) exposure to covid-19: Secondary | ICD-10-CM | POA: Insufficient documentation

## 2019-11-09 DIAGNOSIS — R059 Cough, unspecified: Secondary | ICD-10-CM

## 2019-11-09 DIAGNOSIS — F329 Major depressive disorder, single episode, unspecified: Secondary | ICD-10-CM | POA: Insufficient documentation

## 2019-11-09 LAB — SARS CORONAVIRUS 2 (TAT 6-24 HRS): SARS Coronavirus 2: NEGATIVE

## 2019-11-09 LAB — GROUP A STREP BY PCR: Group A Strep by PCR: NOT DETECTED

## 2019-11-09 NOTE — ED Triage Notes (Signed)
Patient c/o sore throat, cough, and runny nose that started yesterday.  Patient denies fevers.  Patient states that she has been around someone that was exposed to strep throat.

## 2019-11-09 NOTE — ED Provider Notes (Signed)
34 year old female presenting for cough, myalgias, malaise, sore throat.  Denies chest pain, difficulty breathing, fever.  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  No known Covid contacts.  Patient did have secondary streptococcal exposure as multiple students in son's class had strep.  She is sitting in no acute distress.  Patient is able to speak in full sentences without sneezing, wheezing.  Does have intermittent cough.  Discussed utility of Covid testing +/-strep testing.  Patient agreeable to this, electing to go to [facility] urgent care as she lives there.   [place], [place], [place] 11/09/19 [phone]

## 2019-11-09 NOTE — Discharge Instructions (Signed)
Rest, fluids, over the counter medications as needed  

## 2019-11-09 NOTE — Discharge Instructions (Addendum)
Go to [facility] urgent care for testing/evaluation.  There will be no charge from today's virtual visit - take care!

## 2019-11-14 ENCOUNTER — Encounter: Payer: Self-pay | Admitting: Obstetrics and Gynecology

## 2019-11-14 ENCOUNTER — Ambulatory Visit (INDEPENDENT_AMBULATORY_CARE_PROVIDER_SITE_OTHER): Payer: Managed Care, Other (non HMO) | Admitting: Obstetrics and Gynecology

## 2019-11-14 ENCOUNTER — Other Ambulatory Visit: Payer: Self-pay

## 2019-11-14 ENCOUNTER — Ambulatory Visit (INDEPENDENT_AMBULATORY_CARE_PROVIDER_SITE_OTHER): Payer: Managed Care, Other (non HMO)

## 2019-11-14 VITALS — BP 114/72 | Wt 235.2 lb

## 2019-11-14 DIAGNOSIS — O0992 Supervision of high risk pregnancy, unspecified, second trimester: Secondary | ICD-10-CM

## 2019-11-14 DIAGNOSIS — O24319 Unspecified pre-existing diabetes mellitus in pregnancy, unspecified trimester: Secondary | ICD-10-CM | POA: Diagnosis not present

## 2019-11-14 DIAGNOSIS — Z3A27 27 weeks gestation of pregnancy: Secondary | ICD-10-CM | POA: Diagnosis not present

## 2019-11-14 DIAGNOSIS — J4550 Severe persistent asthma, uncomplicated: Secondary | ICD-10-CM

## 2019-11-14 DIAGNOSIS — O99210 Obesity complicating pregnancy, unspecified trimester: Secondary | ICD-10-CM

## 2019-11-14 DIAGNOSIS — Z98891 History of uterine scar from previous surgery: Secondary | ICD-10-CM

## 2019-11-14 MED ORDER — INSULIN LISPRO (1 UNIT DIAL) 100 UNIT/ML (KWIKPEN): 6.0000 [IU] | PEN_INJECTOR | Freq: Three times a day (TID) | SUBCUTANEOUS | 11 refills | Status: DC

## 2019-11-14 MED ORDER — INSULIN ISOPHANE HUMAN 100 UNIT/ML KWIKPEN: PEN_INJECTOR | SUBCUTANEOUS | 11 refills | Status: DC

## 2019-11-14 MED ORDER — FLUTICASONE-SALMETEROL 250-50 MCG/DOSE IN AEPB: 2.0000 | INHALATION_SPRAY | Freq: Two times a day (BID) | RESPIRATORY_TRACT | 6 refills | Status: DC

## 2019-11-14 NOTE — ED Provider Notes (Signed)
[facility]    CSN: [id number] Arrival date & time: 11/09/19  0847      History   Chief Complaint Chief Complaint  Patient presents with  . Sore Throat  . Cough    HPI Shelby Aguilar is a 34 y.o. female.   34 yo female with a c/o sore throat, cough and runny nose since yesterday. Denies any fevers, chills, chest pains or shortness of breath. States was exposed to someone with strep.    Sore Throat  Cough   Past Medical History:  Diagnosis Date  . Asthma   . Depression   . Family history of breast cancer    1/21 cancer genetic tesitng letter sent  . Gestational diabetes   . PCOS (polycystic ovarian syndrome)     Patient Active Problem List   Diagnosis Date Noted  . Severe persistent asthma without complication 81/19/1478  . Amenorrhea 10/16/2019  . Pregestational diabetes mellitus, modified White class B 08/02/2019  . BMI 39.0-39.9,adult 07/19/2019  . History of 2 cesarean sections 07/10/2019  . Obesity affecting pregnancy, antepartum 11/19/2017  . Supervision of high risk pregnancy, antepartum 11/19/2017  . PCOD (polycystic ovarian disease) 10/22/2014  . GOITER, UNSPECIFIED 05/03/2007    Past Surgical History:  Procedure Laterality Date  . CESAREAN SECTION  2013  . CESAREAN SECTION N/A 07/25/2018   Procedure: CESAREAN SECTION;  Surgeon: Shelby Fellers, MD;  Location: [facility] ORS;  Service: Obstetrics;  Laterality: N/A;    OB History    Gravida  3   Para  2   Term  2   Preterm      AB      Living  2     SAB      TAB      Ectopic      Multiple  0   Live Births  2        Obstetric Comments  IOL with G1 for PUPPS         Home Medications    Prior to Admission medications   Medication Sig Start Date End Date Taking? Authorizing Provider  albuterol (VENTOLIN HFA) 108 (90 Base) MCG/ACT inhaler Inhale 2 puffs into the lungs every 4 (four) hours as needed for wheezing or shortness of breath. 10/02/19  Yes Shelby Dry, MD  Prenatal Vit-Fe Fumarate-FA (MULTIVITAMIN-PRENATAL) 27-0.8 MG TABS tablet Take 1 tablet by mouth daily.    Yes [provider]  sertraline (ZOLOFT) 50 MG tablet TAKE 1 TABLET BY MOUTH EVERY DAY 09/21/19  Yes Shelby Mood, MD  albuterol (PROVENTIL HFA;VENTOLIN HFA) 108 (90 Base) MCG/ACT inhaler Inhale 2 puffs into the lungs every 6 (six) hours as needed for wheezing or shortness of breath.    [provider]  albuterol (VENTOLIN HFA) 108 (90 Base) MCG/ACT inhaler Inhale into the lungs. 10/02/19   [provider]  Fluticasone-Salmeterol (ADVAIR DISKUS) 250-50 MCG/DOSE AEPB Inhale 2 puffs into the lungs in the morning and at bedtime. 11/14/19   Shelby Fellers, MD  glucose blood (ONETOUCH VERIO) test strip  10/12/19   [provider]  glucose blood test strip Use as instructed 08/10/19   Shelby Aguilar, CNM  insulin lispro (HUMALOG KWIKPEN) 100 UNIT/ML KwikPen Inject 0.06 mLs (6 Units total) into the skin 3 (three) times daily before meals. 11/14/19   Schuman, Stefanie Libel, MD  Insulin NPH, Human,, Isophane, (HUMULIN N) 100 UNIT/ML Kiwkpen 30 units in the morning, 40 units at night 11/14/19   Schuman,  Christanna R, MD  Insulin Pen Needle (BD PEN NEEDLE MICRO U/F) 32G X 6 MM MISC USE AS DIRECTED TO INJECT INSULIN 09/28/19   [provider]  Insulin Pen Needle (NOVOFINE) 32G X 6 MM MISC 5 Units by Does not apply route daily. 08/29/19   Shelby Austria, MD  Insulin Pen Needle (NOVOFINE) 32G X 6 MM MISC Use 08/29/19   [provider]  Lancets [facility] ULTRASOFT) lancets Use as instructed 08/10/19   Shelby Aguilar, CNM  ondansetron (ZOFRAN) 4 MG tablet Take 1 tablet (4 mg total) by mouth every 8 (eight) hours as needed for nausea or vomiting. 08/07/19   Shelby Pierini, FNP  OneTouch Delica Lancets 33G MISC TEST 4 TIMES DAILY 09/20/19   [provider]  sertraline (ZOLOFT) 50 MG tablet Take by mouth. 09/21/19   [provider]    Family History Family History  Problem Relation Age of Onset  . Breast cancer Paternal Grandmother 62  . Diabetes Father   . Diabetes Sister     Social History Social History   Tobacco Use  . Smoking status: Never Smoker  . Smokeless tobacco: Never Used  Substance Use Topics  . Alcohol use: Not Currently  . Drug use: No     Allergies   Patient has no known allergies.   Review of Systems Review of Systems  Respiratory: Positive for cough.      Physical Exam Triage Vital Signs ED Triage Vitals  Enc Vitals Group     BP 11/09/19 0900 110/75     Pulse Rate 11/09/19 0900 (!) 104     Resp 11/09/19 0900 14     Temp 11/09/19 0900 98.4 F (36.9 C)     Temp Source 11/09/19 0900 Oral     SpO2 11/09/19 0900 98 %     Weight 11/09/19 0856 237 lb (107.5 kg)     Height 11/09/19 0856 5\' 3"  (1.6 m)     Head Circumference --      Peak Flow --      Pain Score 11/09/19 0856 7     Pain Loc --      Pain Edu? --      Excl. in GC? --    No data found.  Updated Vital Signs BP 110/75 (BP Location: Right Arm)   Pulse (!) 104   Temp 98.4 F (36.9 C) (Oral)   Resp 14   Ht 5\' 3"  (1.6 m)   Wt 107.5 kg   LMP 04/23/2019   SpO2 98%   BMI 41.98 kg/m   Visual Acuity Right Eye Distance:   Left Eye Distance:   Bilateral Distance:    Right Eye Near:   Left Eye Near:    Bilateral Near:     Physical Exam Vitals and nursing note reviewed.  Constitutional:      General: She is not in acute distress.    Appearance: She is not toxic-appearing or diaphoretic.  HENT:     Right Ear: Tympanic membrane normal.     Left Ear: Tympanic membrane normal.     Mouth/Throat:     Pharynx: Posterior oropharyngeal erythema present. No oropharyngeal exudate.  Cardiovascular:     Rate and Rhythm: Normal rate.     Heart sounds: Normal heart sounds.  Pulmonary:     Effort: Pulmonary effort is normal. No respiratory distress.     Breath sounds: Normal breath sounds.    Neurological:     Mental Status: She is alert.  UC Treatments / Results  Labs (all labs ordered are listed, but only abnormal results are displayed) Labs Reviewed  GROUP A STREP BY PCR  SARS CORONAVIRUS 2 (TAT 6-24 HRS)    EKG   Radiology US OB Follow Up  Result Date: 11/14/2019 Patient Name: Shelby Aguilar DOB: [birth year] MRN: [id number] ULTRASOUND REPORT Location: [facility] Date of Service: 11/14/2019 Indications:growth/afi Findings: Mason Jim intrauterine pregnancy is visualized with FHR at 175 BPM. Biometrics give an (U/S) Gestational age of [redacted]w[redacted]d and an (U/S) EDD of 02/13/2020; this correlates with the clinically established Estimated Date of Delivery: 02/11/20. Fetal presentation is Breech. Placenta: posterior fundal Grade: 1 AFI: 17.7 cm EFW:  992 g ( 2 lb 3 oz ).  AC percentile is 29.8%. Growth percentage is 43.2% Impression: 1. [redacted]w[redacted]d Viable Singleton Intrauterine pregnancy previously established criteria. 2. Growth is 43.2 %ile.  AFI is 17.7 cm. Recommendations: 1.Clinical correlation with the patient's History and Physical Exam. 2. Repeat growth scan in 4 weeks Deanna Artis, RT I have reviewed this ultrasound and the report. I agree with the above assessment and plan. Adelene Idler MD [facility] 11/14/19 8:49 AM     Procedures Procedures (including critical care time)  Medications Ordered in UC Medications - No data to display  Initial Impression / Assessment and Plan / UC Course  I have reviewed the triage vital signs and the nursing notes.  Pertinent labs & imaging results that were available during my care of the patient were reviewed by me and considered in my medical decision making (see chart for details).      Final Clinical Impressions(s) / UC Diagnoses   Final diagnoses:  Viral URI with cough     Discharge Instructions     Rest, fluids, over the counter medications as needed    ED Prescriptions     None      1. diagnosis reviewed with patient 2. Recommend supportive treatment as above 3. Follow-up prn if symptoms worsen or don't improve  PDMP not reviewed this encounter.   Payton Mccallum, MD 11/14/19 1008

## 2019-11-14 NOTE — Patient Instructions (Addendum)
Peak Flow Meter  A peak flow meter is a device that helps you determine how well your asthma is being controlled. The device measures the flow of air out of your lungs. This is a simple but important tool in daily asthma management. Peak flow meters are available over the counter. The readings from the meter will help you and your health care provider:  Determine the severity of your asthma.  Evaluate the effectiveness of your current treatment.  Determine when to add or stop certain medicines.  Recognize an asthma attack before signs or symptoms appear.  Decide when to seek emergency care. What are the risks?  Dizziness. Breathing too quickly into the peak flow meter may cause dizziness. This could cause you to pass out. Take your time so you do not get dizzy or light-headed.  False readings. Not cleaning your meter may cause false results. You may not know that your asthma is getting better or getting worse, making you to start or stop treatment improperly. How to find your personal best Your "personal best" is the highest peak flow rate you can reach when you feel good and have no asthma symptoms. This can be used as your standard for comparing your peak flow meter readings. Because everyone's asthma is different, your personal best will be unique to you. Your health care provider will help you to figure out your personal best. Typically, you will take readings once or twice a day for 2 weeks when you are not having symptoms. The highest reading during the trial period is your personal best. Because your lung function can change over time, your personal best should be measured each year. How to use your peak flow meter 1. Move the upper marker to the number that is your personal best. 2. Move the lower marker to the bottom of the numbered scale. 3. Connect the mouthpiece to the peak flow meter. 4. Stand up. 5. Take a deep breath. Make sure you fill your lungs completely. 6. Place your  lips tightly around the mouthpiece. Blow as hard and as fast as you can with a single breath (forced exhalation), as if you are blowing out candles. If your lips are not placed tightly around the mouthpiece of the peak flow meter, you will get incorrect low readings. 7. At the end of your forced exhale, check to see what number the lower marker landed on. This is your peak flow rate. 8. Move the lower marker back to the bottom of the numbered scale. 9. Repeat these steps 2 more times. Record the highest reading of the 3 tries in your asthma diary. Do not calculate the average for your 3 tries, just record the highest. Always write down the results in your asthma diary. After using your peak flow meter, rest and breathe slowly and easily. Keep a record of your progress. Your health care provider can provide you with a simple table to help with this. For the most accurate readings, it is important to keep your peak flow meter clean. Follow the manufacturer's instructions on how to take care of your peak flow meter. Your health care provider will give you instructions on when to do regular monitoring. You may also need to check your peak flow when:  Coughing, wheezing, or other asthma symptoms wake you up at night.  Your asthma symptoms worsen during the day.  Your breathing is made worse because of a cold, flu, or other respiratory illness.  You need to use your quick-relief, "  rescue medicine." It is best to check your peak flow rate before you use rescue medicine, and then 20-30 minutes afterward. This will help determine whether you need to take the rescue medicine again. How to use your results Use color-coded zones on the meter to see how your peak flow rate compares to your personal best. If your peak flow readings fall too far below your personal best into the yellow or red zone, you will need to take action to prevent or minimize an asthma attack. The color code for each zone reflects  progressively more severe symptoms: Green zone: Good   Peak flow rate between 80% and 100% of your personal best. Your asthma is under good control when your peak flow rate is in the green zone.  When you are in the green zone, you are not likely to be experiencing any asthma symptoms.  Only take your regular, preventive medicine. Your rescue medicine is not required. Yellow zone: Caution   Peak flow rate between 50% and 80% of your personal best. Your asthma is getting worse and could be improved.  You may have begun coughing, wheezing, or feeling chest tightness. Sometimes peak flow rates dip down into the yellow zone before asthma symptoms appear.  Consider increasing or changing your asthma medicine. This may include using your rescue medicine. If you have an asthma action plan, follow each step listed for the yellow zone, including medicine changes. Red zone: Warning   Peak flow rate below 50% of your personal best. This may mean you are having, or are at risk of having, a medical emergency.  Your coughing, wheezing, or shortness of breath may have become severe. Do not wait to use your rescue medicine. Stop whatever you are doing and use your rescue inhaler, nebulizer, or other medicines to open your airways.  If you have an asthma action plan, follow each step listed for the red zone, including medicine changes and when to seek emergency medical care. Contact a health care provider if: You are in the yellow zone. If you have an asthma action plan, follow all of the steps listed under the yellow zone section of the plan. Let your health care provider know that you are in the yellow zone. Get help right away if: You are in the red zone. If you have an asthma action plan, follow all of the steps listed under the red zone section of the plan while you are seeking immediate medical care. Summary  A peak flow meter is a device that helps you determine how well your asthma is being  controlled. The device measures the flow of air out of your lungs.  Readings from the meter will help you determine the severity of your asthma, whether your treatment is working, and when to start or stop treatment.  Measure your personal best every year during periods of no symptoms. The meter will compare this reading to your regular readings to determine your condition at a given time.  Work with your health care provider to understand what each zone (green, yellow, red) means and what actions to take in each zone. This information is not intended to replace advice given to you by your health care provider. Make sure you discuss any questions you have with your health care provider. Document Revised: 05/20/2017 Document Reviewed: 06/03/2016 Elsevier Patient Education  2020 [employer].

## 2019-11-14 NOTE — Progress Notes (Signed)
Routine Prenatal Care Visit  Subjective  Shelby Aguilar is a 34 y.o. G3P2002 at [redacted]w[redacted]d being seen today for ongoing prenatal care.  She is currently monitored for the following issues for this high-risk pregnancy and has GOITER, UNSPECIFIED; PCOD (polycystic ovarian disease); Obesity affecting pregnancy, antepartum; Supervision of high risk pregnancy, antepartum; History of 2 cesarean sections; BMI 39.0-39.9,adult; Pregestational diabetes mellitus, modified White class B; Amenorrhea; and Severe persistent asthma without complication on their problem list.  ----------------------------------------------------------------------------------- Patient reports cough and persistent asthma symptoms.   Contractions: Not present. Vag. Bleeding: None.  Movement: Present. Denies leaking of fluid.    How frequently are you having symptoms of asthma? Throughout the day  How frequently do you have nighttime awakenings? Four times per week or more  How much does your asthma interfere with normal activity? Some limitation  ----------------------------------------------------------------------------------- The following portions of the patient's history were reviewed and updated as appropriate: allergies, current medications, past family history, past medical history, past social history, past surgical history and problem list. Problem list updated.  Objective  Blood pressure 114/72, weight 235 lb 3.2 oz (106.7 kg), last menstrual period 04/23/2019, currently breastfeeding. Pregravid weight 220 lb (99.8 kg) Total Weight Gain 15 lb 3.2 oz (6.895 kg) Urinalysis:      Fetal Status: Fetal Heart Rate (bpm): 165   Movement: Present     General:  Alert, oriented and cooperative. Patient is in no acute distress.  Skin: Skin is warm and dry. No rash noted.   Cardiovascular: Normal heart rate noted  Respiratory: Normal respiratory effort, no problems with respiration noted  Abdomen: Soft, gravid,  appropriate for gestational age. Pain/Pressure: Absent     Pelvic:  Cervical exam deferred        Extremities: Normal range of motion.  Edema: None  Mental Status: Normal mood and affect. Normal behavior. Normal judgment and thought content.       Assessment   34 y.o. G3P2002 at [redacted]w[redacted]d by  02/11/2020, by Ultrasound presenting for routine prenatal visit  Plan   pregnancy Problems (from 04/23/19 to present)    Problem Noted Resolved   BMI 39.0-39.9,adult 07/19/2019 by Will Bonnet, MD No   History of 2 cesarean sections 07/10/2019 by Gae Dry, MD No   Obesity affecting pregnancy, antepartum 11/19/2017 by Rod Can, CNM No   Overview Signed 07/10/2019  2:47 PM by Gae Dry, MD    BMI >=40 [ ]  early 1h gtt -  [ ]  u/s for dating [ ]   [ ]  nutritional goals [ ]  folic acid 1mg  [ ]  bASA (>12 weeks) [ ]  consider nutrition consult [ ]  consider maternal EKG 1st trimester [ ]  Growth u/s 28 [ ] , 32 [ ] , 36 weeks [ ]  [ ]  NST/AFI weekly 36+ weeks (36[] , 37[] , 38[] )       Supervision of high risk pregnancy, antepartum 11/19/2017 by Rod Can, CNM No   Overview Addendum 08/15/2019  2:01 PM by Malachy Mood, MD    [facility]inic [facility] Prenatal Labs  Dating 10 week [place] Blood type: A/Positive/-- (01/19 1452)   Genetic Screen  NIPS: XX Antibody:Negative (01/19 1452)  Anatomic [place]  Rubella: 10.70 (01/19 1452) Varicella: Imm  GTT Early: 147 3 hr GTT 103  / 189 / 160 /149 RPR: Non Reactive (01/19 1452)   Rhogam N/A HBsAg: Negative (01/19 1452)   TDaP vaccine          Flu Shot:03/2019 HIV: Non Reactive (01/19 1452)  Baby Food     Breast                           GBS:   Contraception  Pap:07/10/19  CBB  No   CS/VBAC CS x2, plans CS   Support Person Husband [place]              Normal fetal growth [place] today. Insulin adjusted to 30 AM, 40 PM and 6/6/6 with meals Asthma: advair initated with 2 puff BID. Discussed peak flow meter. Referral to pulmonology given severe  symptoms.  MFM consultation placed.   Cesarean scheduled for [redacted] week gestation. Discussed that earlier delivery may be warrented but will be determined based off of glucose control and other pregnancy complications.    Gestational age appropriate obstetric precautions including but not limited to vaginal bleeding, contractions, leaking of fluid and fetal movement were reviewed in detail with the patient.    Return in about 2 weeks (around 11/28/2019) for ROB in person.  Natale Milch MD [facility], [facility] 11/14/2019, 9:35 PM

## 2019-11-15 ENCOUNTER — Telehealth: Payer: Self-pay | Admitting: Obstetrics and Gynecology

## 2019-11-15 LAB — HIV ANTIBODY (ROUTINE TESTING W REFLEX): HIV Screen 4th Generation wRfx: NONREACTIVE

## 2019-11-15 LAB — CBC WITH DIFFERENTIAL
Basophils Absolute: 0 10*3/uL (ref 0.0–0.2)
Basos: 0 %
EOS (ABSOLUTE): 0.2 10*3/uL (ref 0.0–0.4)
Eos: 2 %
Hematocrit: 33.1 % — ABNORMAL LOW (ref 34.0–46.6)
Hemoglobin: 10.7 g/dL — ABNORMAL LOW (ref 11.1–15.9)
Immature Grans (Abs): 0 10*3/uL (ref 0.0–0.1)
Immature Granulocytes: 0 %
Lymphocytes Absolute: 1.9 10*3/uL (ref 0.7–3.1)
Lymphs: 15 %
MCH: 26 pg — ABNORMAL LOW (ref 26.6–33.0)
MCHC: 32.3 g/dL (ref 31.5–35.7)
MCV: 80 fL (ref 79–97)
Monocytes Absolute: 0.5 10*3/uL (ref 0.1–0.9)
Monocytes: 4 %
Neutrophils Absolute: 10.1 10*3/uL — ABNORMAL HIGH (ref 1.4–7.0)
Neutrophils: 79 %
RBC: 4.12 x10E6/uL (ref 3.77–5.28)
RDW: 13.9 % (ref 11.7–15.4)
WBC: 12.7 10*3/uL — ABNORMAL HIGH (ref 3.4–10.8)

## 2019-11-15 LAB — RPR: RPR Ser Ql: NONREACTIVE

## 2019-11-20 ENCOUNTER — Other Ambulatory Visit: Payer: Self-pay | Admitting: Obstetrics and Gynecology

## 2019-11-20 DIAGNOSIS — O24319 Unspecified pre-existing diabetes mellitus in pregnancy, unspecified trimester: Secondary | ICD-10-CM

## 2019-11-20 DIAGNOSIS — O0992 Supervision of high risk pregnancy, unspecified, second trimester: Secondary | ICD-10-CM

## 2019-11-20 NOTE — Telephone Encounter (Signed)
Confirmed C/S w [facility] on 02/06/20  H&P 8/9 @ 8:10am  Covid 8/17 @ 9-10am, Medical [address], drive up and wear mask. Advised pt to quarantine until DOS.  Pre-admit phone call appointment to be requested - date and time will be included on H&P paper work. Also all appointments will be updated on pt MyChart. Explained that this appointment has a call window. Based on the time scheduled will indicate if the call will be received within a 4 hour window before 1:00 or after.  Advised that pt may also receive calls from the hospital pharmacy and pre-service center.  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 No secondary insurance.

## 2019-11-30 ENCOUNTER — Ambulatory Visit (INDEPENDENT_AMBULATORY_CARE_PROVIDER_SITE_OTHER): Payer: Managed Care, Other (non HMO) | Admitting: Obstetrics and Gynecology

## 2019-11-30 ENCOUNTER — Encounter: Payer: Self-pay | Admitting: Obstetrics and Gynecology

## 2019-11-30 ENCOUNTER — Other Ambulatory Visit: Payer: Self-pay

## 2019-11-30 VITALS — BP 118/70 | Ht 63.0 in | Wt 237.8 lb

## 2019-11-30 DIAGNOSIS — Z98891 History of uterine scar from previous surgery: Secondary | ICD-10-CM

## 2019-11-30 DIAGNOSIS — O99013 Anemia complicating pregnancy, third trimester: Secondary | ICD-10-CM

## 2019-11-30 DIAGNOSIS — O0993 Supervision of high risk pregnancy, unspecified, third trimester: Secondary | ICD-10-CM

## 2019-11-30 DIAGNOSIS — O99213 Obesity complicating pregnancy, third trimester: Secondary | ICD-10-CM

## 2019-11-30 DIAGNOSIS — O99210 Obesity complicating pregnancy, unspecified trimester: Secondary | ICD-10-CM

## 2019-11-30 DIAGNOSIS — O0990 Supervision of high risk pregnancy, unspecified, unspecified trimester: Secondary | ICD-10-CM

## 2019-11-30 DIAGNOSIS — R0683 Snoring: Secondary | ICD-10-CM

## 2019-11-30 DIAGNOSIS — Z3A29 29 weeks gestation of pregnancy: Secondary | ICD-10-CM

## 2019-11-30 DIAGNOSIS — G4730 Sleep apnea, unspecified: Secondary | ICD-10-CM

## 2019-11-30 LAB — POCT URINALYSIS DIPSTICK OB
Glucose, UA: NEGATIVE
POC,PROTEIN,UA: NEGATIVE

## 2019-11-30 NOTE — Patient Instructions (Addendum)
Third Trimester of Pregnancy The third trimester is from week 28 through week 40 (months 7 through 9). The third trimester is a time when the unborn baby (fetus) is growing rapidly. At the end of the ninth month, the fetus is about 20 inches in length and weighs 6-10 pounds. Body changes during your third trimester Your body will continue to go through many changes during pregnancy. The changes vary from woman to woman. During the third trimester:  Your weight will continue to increase. You can expect to gain 25-35 pounds (11-16 kg) by the end of the pregnancy.  You may begin to get stretch marks on your hips, abdomen, and breasts.  You may urinate more often because the fetus is moving lower into your pelvis and pressing on your bladder.  You may develop or continue to have heartburn. This is caused by increased hormones that slow down muscles in the digestive tract.  You may develop or continue to have constipation because increased hormones slow digestion and cause the muscles that push waste through your intestines to relax.  You may develop hemorrhoids. These are swollen veins (varicose veins) in the rectum that can itch or be painful.  You may develop swollen, bulging veins (varicose veins) in your legs.  You may have increased body aches in the pelvis, back, or thighs. This is due to weight gain and increased hormones that are relaxing your joints.  You may have changes in your hair. These can include thickening of your hair, rapid growth, and changes in texture. Some women also have hair loss during or after pregnancy, or hair that feels dry or thin. Your hair will most likely return to normal after your baby is born.  Your breasts will continue to grow and they will continue to become tender. A yellow fluid (colostrum) may leak from your breasts. This is the first milk you are producing for your baby.  Your belly button may stick out.  You may notice more swelling in your hands,  face, or ankles.  You may have increased tingling or numbness in your hands, arms, and legs. The skin on your belly may also feel numb.  You may feel short of breath because of your expanding uterus.  You may have more problems sleeping. This can be caused by the size of your belly, increased need to urinate, and an increase in your body's metabolism.  You may notice the fetus "dropping," or moving lower in your abdomen (lightening).  You may have increased vaginal discharge.  You may notice your joints feel loose and you may have pain around your pelvic bone. What to expect at prenatal visits You will have prenatal exams every 2 weeks until week 36. Then you will have weekly prenatal exams. During a routine prenatal visit:  You will be weighed to make sure you and the baby are growing normally.  Your blood pressure will be taken.  Your abdomen will be measured to track your baby's growth.  The fetal heartbeat will be listened to.  Any test results from the previous visit will be discussed.  You may have a cervical check near your due date to see if your cervix has softened or thinned (effaced).  You will be tested for Group B streptococcus. This happens between 35 and 37 weeks. Your health care provider may ask you:  What your birth plan is.  How you are feeling.  If you are feeling the baby move.  If you have had any abnormal   symptoms, such as leaking fluid, bleeding, severe headaches, or abdominal cramping.  If you are using any tobacco products, including cigarettes, chewing tobacco, and electronic cigarettes.  If you have any questions. Other tests or screenings that may be performed during your third trimester include:  Blood tests that check for low iron levels (anemia).  Fetal testing to check the health, activity level, and growth of the fetus. Testing is done if you have certain medical conditions or if there are problems during the pregnancy.  Nonstress test  (NST). This test checks the health of your baby to make sure there are no signs of problems, such as the baby not getting enough oxygen. During this test, a belt is placed around your belly. The baby is made to move, and its heart rate is monitored during movement. What is false labor? False labor is a condition in which you feel small, irregular tightenings of the muscles in the womb (contractions) that usually go away with rest, changing position, or drinking water. These are called Braxton Hicks contractions. Contractions may last for hours, days, or even weeks before true labor sets in. If contractions come at regular intervals, become more frequent, increase in intensity, or become painful, you should see your health care provider. What are the signs of labor?  Abdominal cramps.  Regular contractions that start at 10 minutes apart and become stronger and more frequent with time.  Contractions that start on the top of the uterus and spread down to the lower abdomen and back.  Increased pelvic pressure and dull back pain.  A watery or bloody mucus discharge that comes from the vagina.  Leaking of amniotic fluid. This is also known as your "water breaking." It could be a slow trickle or a gush. Let your health care provider know if it has a color or strange odor. If you have any of these signs, call your health care provider right away, even if it is before your due date. Follow these instructions at home: Medicines  Follow your health care provider's instructions regarding medicine use. Specific medicines may be either safe or unsafe to take during pregnancy.  Take a prenatal vitamin that contains at least 600 micrograms (mcg) of folic acid.  If you develop constipation, try taking a stool softener if your health care provider approves. Eating and drinking   Eat a balanced diet that includes fresh fruits and vegetables, whole grains, good sources of protein such as meat, eggs, or tofu,  and low-fat dairy. Your health care provider will help you determine the amount of weight gain that is right for you.  Avoid raw meat and uncooked cheese. These carry germs that can cause birth defects in the baby.  If you have low calcium intake from food, talk to your health care provider about whether you should take a daily calcium supplement.  Eat four or five small meals rather than three large meals a day.  Limit foods that are high in fat and processed sugars, such as fried and sweet foods.  To prevent constipation: ? Drink enough fluid to keep your urine clear or pale yellow. ? Eat foods that are high in fiber, such as fresh fruits and vegetables, whole grains, and beans. Activity  Exercise only as directed by your health care provider. Most women can continue their usual exercise routine during pregnancy. Try to exercise for 30 minutes at least 5 days a week. Stop exercising if you experience uterine contractions.  Avoid heavy lifting.  Do   not exercise in extreme heat or humidity, or at high altitudes.  Wear low-heel, comfortable shoes.  Practice good posture.  You may continue to have sex unless your health care provider tells you otherwise. Relieving pain and discomfort  Take frequent breaks and rest with your legs elevated if you have leg cramps or low back pain.  Take warm sitz baths to soothe any pain or discomfort caused by hemorrhoids. Use hemorrhoid cream if your health care provider approves.  Wear a good support bra to prevent discomfort from breast tenderness.  If you develop varicose veins: ? Wear support pantyhose or compression stockings as told by your healthcare provider. ? Elevate your feet for 15 minutes, 3-4 times a day. Prenatal care  Write down your questions. Take them to your prenatal visits.  Keep all your prenatal visits as told by your health care provider. This is important. Safety  Wear your seat belt at all times when driving.  Make  a list of emergency phone numbers, including numbers for family, friends, the hospital, and police and fire departments. General instructions  Avoid cat litter boxes and soil used by cats. These carry germs that can cause birth defects in the baby. If you have a cat, ask someone to clean the litter box for you.  Do not travel far distances unless it is absolutely necessary and only with the approval of your health care provider.  Do not use hot tubs, steam rooms, or saunas.  Do not drink alcohol.  Do not use any products that contain nicotine or tobacco, such as cigarettes and e-cigarettes. If you need help quitting, ask your health care provider.  Do not use any medicinal herbs or unprescribed drugs. These chemicals affect the formation and growth of the baby.  Do not douche or use tampons or scented sanitary pads.  Do not cross your legs for long periods of time.  To prepare for the arrival of your baby: ? Take prenatal classes to understand, practice, and ask questions about labor and delivery. ? Make a trial run to the hospital. ? Visit the hospital and tour the maternity area. ? Arrange for maternity or paternity leave through employers. ? Arrange for family and friends to take care of pets while you are in the hospital. ? Purchase a rear-facing car seat and make sure you know how to install it in your car. ? Pack your hospital bag. ? Prepare the baby's nursery. Make sure to remove all pillows and stuffed animals from the baby's crib to prevent suffocation.  Visit your dentist if you have not gone during your pregnancy. Use a soft toothbrush to brush your teeth and be gentle when you floss. Contact a health care provider if:  You are unsure if you are in labor or if your water has broken.  You become dizzy.  You have mild pelvic cramps, pelvic pressure, or nagging pain in your abdominal area.  You have lower back pain.  You have persistent nausea, vomiting, or  diarrhea.  You have an unusual or bad smelling vaginal discharge.  You have pain when you urinate. Get help right away if:  Your water breaks before 37 weeks.  You have regular contractions less than 5 minutes apart before 37 weeks.  You have a fever.  You are leaking fluid from your vagina.  You have spotting or bleeding from your vagina.  You have severe abdominal pain or cramping.  You have rapid weight loss or weight gain.  You have   shortness of breath with chest pain.  You notice sudden or extreme swelling of your face, hands, ankles, feet, or legs.  Your baby makes fewer than 10 movements in 2 hours.  You have severe headaches that do not go away when you take medicine.  You have vision changes. Summary  The third trimester is from week 28 through week 40, months 7 through 9. The third trimester is a time when the unborn baby (fetus) is growing rapidly.  During the third trimester, your discomfort may increase as you and your baby continue to gain weight. You may have abdominal, leg, and back pain, sleeping problems, and an increased need to urinate.  During the third trimester your breasts will keep growing and they will continue to become tender. A yellow fluid (colostrum) may leak from your breasts. This is the first milk you are producing for your baby.  False labor is a condition in which you feel small, irregular tightenings of the muscles in the womb (contractions) that eventually go away. These are called Braxton Hicks contractions. Contractions may last for hours, days, or even weeks before true labor sets in.  Signs of labor can include: abdominal cramps; regular contractions that start at 10 minutes apart and become stronger and more frequent with time; watery or bloody mucus discharge that comes from the vagina; increased pelvic pressure and dull back pain; and leaking of amniotic fluid. This information is not intended to replace advice given to you by your  health care provider. Make sure you discuss any questions you have with your health care provider. Document Revised: 09/28/2018 Document Reviewed: 07/13/2016 Elsevier Patient Education  2020 [employer]   Sleep Apnea Sleep apnea is a condition in which breathing pauses or becomes shallow during sleep. Episodes of sleep apnea usually last 10 seconds or longer, and they may occur as many as 20 times an hour. Sleep apnea disrupts your sleep and keeps your body from getting the rest that it needs. This condition can increase your risk of certain health problems, including:  Heart attack.  Stroke.  Obesity.  Diabetes.  Heart failure.  Irregular heartbeat. What are the causes? There are three kinds of sleep apnea:  Obstructive sleep apnea. This kind is caused by a blocked or collapsed airway.  Central sleep apnea. This kind happens when the part of the brain that controls breathing does not send the correct signals to the muscles that control breathing.  Mixed sleep apnea. This is a combination of obstructive and central sleep apnea. The most common cause of this condition is a collapsed or blocked airway. An airway can collapse or become blocked if:  Your throat muscles are abnormally relaxed.  Your tongue and tonsils are larger than normal.  You are overweight.  Your airway is smaller than normal. What increases the risk? You are more likely to develop this condition if you:  Are overweight.  Smoke.  Have a smaller than normal airway.  Are elderly.  Are female.  Drink alcohol.  Take sedatives or tranquilizers.  Have a family history of sleep apnea. What are the signs or symptoms? Symptoms of this condition include:  Trouble staying asleep.  Daytime sleepiness and tiredness.  Irritability.  Loud snoring.  Morning headaches.  Trouble concentrating.  Forgetfulness.  Decreased interest in sex.  Unexplained sleepiness.  Mood swings.  Personality  changes.  Feelings of depression.  Waking up often during the night to urinate.  Dry mouth.  Sore throat. How is this diagnosed?  This condition may be diagnosed with:  A medical history.  A physical exam.  A series of tests that are done while you are sleeping (sleep study). These tests are usually done in a sleep lab, but they may also be done at home. How is this treated? Treatment for this condition aims to restore normal breathing and to ease symptoms during sleep. It may involve managing health issues that can affect breathing, such as high blood pressure or obesity. Treatment may include:  Sleeping on your side.  Using a decongestant if you have nasal congestion.  Avoiding the use of depressants, including alcohol, sedatives, and narcotics.  Losing weight if you are overweight.  Making changes to your diet.  Quitting smoking.  Using a device to open your airway while you sleep, such as: ? An oral appliance. This is a custom-made mouthpiece that shifts your lower jaw forward. ? A continuous positive airway pressure (CPAP) device. This device blows air through a mask when you breathe out (exhale). ? A nasal expiratory positive airway pressure (EPAP) device. This device has valves that you put into each nostril. ? A bi-level positive airway pressure (BPAP) device. This device blows air through a mask when you breathe in (inhale) and breathe out (exhale).  Having surgery if other treatments do not work. During surgery, excess tissue is removed to create a wider airway. It is important to get treatment for sleep apnea. Without treatment, this condition can lead to:  High blood pressure.  Coronary artery disease.  In men, an inability to achieve or maintain an erection (impotence).  Reduced thinking abilities. Follow these instructions at home: Lifestyle  Make any lifestyle changes that your health care provider recommends.  Eat a healthy, well-balanced  diet.  Take steps to lose weight if you are overweight.  Avoid using depressants, including alcohol, sedatives, and narcotics.  Do not use any products that contain nicotine or tobacco, such as cigarettes, e-cigarettes, and chewing tobacco. If you need help quitting, ask your health care provider. General instructions  Take over-the-counter and prescription medicines only as told by your health care provider.  If you were given a device to open your airway while you sleep, use it only as told by your health care provider.  If you are having surgery, make sure to tell your health care provider you have sleep apnea. You may need to bring your device with you.  Keep all follow-up visits as told by your health care provider. This is important. Contact a health care provider if:  The device that you received to open your airway during sleep is uncomfortable or does not seem to be working.  Your symptoms do not improve.  Your symptoms get worse. Get help right away if:  You develop: ? Chest pain. ? Shortness of breath. ? Discomfort in your back, arms, or stomach.  You have: ? Trouble speaking. ? Weakness on one side of your body. ? Drooping in your face. These symptoms may represent a serious problem that is an emergency. Do not wait to see if the symptoms will go away. Get medical help right away. Call your local emergency services (911 in the [place] Do not drive yourself to the hospital. Summary  Sleep apnea is a condition in which breathing pauses or becomes shallow during sleep.  The most common cause is a collapsed or blocked airway.  The goal of treatment is to restore normal breathing and to ease symptoms during sleep. This information is not intended  to replace advice given to you by your health care provider. Make sure you discuss any questions you have with your health care provider. Document Revised: 11/22/2018 Document Reviewed: 01/31/2018 Elsevier Patient Education   2020 [employer].

## 2019-11-30 NOTE — Progress Notes (Signed)
Routine Prenatal Care Visit  Subjective  Shelby Aguilar is a 34 y.o. G3P2002 at [redacted]w[redacted]d being seen today for ongoing prenatal care.  She is currently monitored for the following issues for this high-risk pregnancy and has GOITER, UNSPECIFIED; PCOD (polycystic ovarian disease); Obesity affecting pregnancy, antepartum; Supervision of high risk pregnancy, antepartum; History of 2 cesarean sections; BMI 39.0-39.9,adult; Pregestational diabetes mellitus, modified White class B; Amenorrhea; and Severe persistent asthma without complication on their problem list.  ----------------------------------------------------------------------------------- Patient reports fatigue and feeling light headed with standing more frequently.   Contractions: Not present. Vag. Bleeding: None.  Movement: Present. Denies leaking of fluid.  ----------------------------------------------------------------------------------- The following portions of the patient's history were reviewed and updated as appropriate: allergies, current medications, past family history, past medical history, past social history, past surgical history and problem list. Problem list updated.   Objective  Blood pressure 118/70, height 5\' 3"  (1.6 m), weight 237 lb 12.8 oz (107.9 kg), last menstrual period 04/23/2019, currently breastfeeding. Pregravid weight 220 lb (99.8 kg) Total Weight Gain 17 lb 12.8 oz (8.074 kg) Urinalysis:      Fetal Status:     Movement: Present     General:  Alert, oriented and cooperative. Patient is in no acute distress.  Skin: Skin is warm and dry. No rash noted.   Cardiovascular: Normal heart rate noted  Respiratory: Normal respiratory effort, no problems with respiration noted  Abdomen: Soft, gravid, appropriate for gestational age. Pain/Pressure: Absent     Pelvic:  Cervical exam deferred        Extremities: Normal range of motion.     Mental Status: Normal mood and affect. Normal behavior. Normal judgment  and thought content.     Assessment   34 y.o. 38 at [redacted]w[redacted]d by  02/11/2020, by Ultrasound presenting for routine prenatal visit  Plan   pregnancy Problems (from 04/23/19 to present)    Problem Noted Resolved   Severe persistent asthma without complication 11/14/2019 by 11/16/2019, MD No   Overview Signed 11/14/2019  9:30 AM by 11/16/2019, MD    11/14/2019- Advair 250/50 2 puffs BID initiated, pulmonology referral made, advised patient to purchase peak flow meter       BMI 39.0-39.9,adult 07/19/2019 by 07/21/2019, MD No   History of 2 cesarean sections 07/10/2019 by 07/12/2019, MD No   Overview Addendum 11/14/2019  9:32 PM by 11/16/2019, MD    Repeat cesarean scheduled [redacted] wk gestation 02/06/2020      Previous Version   Obesity affecting pregnancy, antepartum 11/19/2017 by 01/19/2018, CNM No   Overview Signed 07/10/2019  2:47 PM by 07/12/2019, MD    BMI >=40 [ ]  early 1h gtt -  [ ]  u/s for dating [ ]   [ ]  nutritional goals [ ]  folic acid 1mg  [ ]  bASA (>12 weeks) [ ]  consider nutrition consult [ ]  consider maternal EKG 1st trimester [ ]  Growth u/s 28 [ ] , 32 [ ] , 36 weeks [ ]  [ ]  NST/AFI weekly 36+ weeks (36[] , 37[] , 38[] )       Supervision of high risk pregnancy, antepartum 11/19/2017 by , CNM No   Overview Addendum 08/15/2019  2:01 PM by , MD    Clinic [facility] Prenatal Labs  Dating 10 week Blood type: A/Positive/-- (01/19 1452)   Genetic Screen  NIPS: XX Antibody:Negative (01/19 1452)  Anatomic  Rubella: 10.70 (01/19 1452) Varicella: Imm  GTT Early:  147 3 hr GTT 103  / 189 / 160 /149 RPR: Non Reactive (01/19 1452)   Rhogam N/A HBsAg: Negative (01/19 1452)   TDaP vaccine          Flu Shot:03/2019 HIV: Non Reactive (01/19 1452)   Baby Food     Breast                           GBS:   Contraception  Pap:07/10/19  CBB  No   CS/VBAC CS x2, plans CS   Support Person Husband [place]             Previous Version           Neck circumference 40 cm Reports loud snoring with previous episodes of observed apnea Reports she has awakened herself before because of the loud snores She as fatigue during the day and can fall asleep quickly.  Will refer for sleep apena screening  Continue current insulin therapy. Patient reports if she has a carb heavy meal she increases short acting insulin to 10 units.   Labs for anemia today. Patient not currently taking oral iron supplement. She takes a lot of medications and has not yet added on iron.  Saw hematology previously for B12 injections.  Referral placed to hematology.  Gestational age appropriate obstetric precautions including but not limited to vaginal bleeding, contractions, leaking of fluid and fetal movement were reviewed in detail with the patient.    Return in about 2 weeks (around 12/14/2019) for ROB in person.  Homero Fellers MD [facility] 11/30/2019, 3:53 PM

## 2019-12-01 LAB — IRON AND TIBC
Iron Saturation: 9 % — CL (ref 15–55)
Iron: 34 ug/dL (ref 27–159)
Total Iron Binding Capacity: 400 ug/dL (ref 250–450)
UIBC: 366 ug/dL (ref 131–425)

## 2019-12-01 LAB — VITAMIN B12: Vitamin B-12: 159 pg/mL — ABNORMAL LOW (ref 232–1245)

## 2019-12-01 LAB — FERRITIN: Ferritin: 16 ng/mL (ref 15–150)

## 2019-12-03 ENCOUNTER — Ambulatory Visit
Admission: RE | Admit: 2019-12-03 | Discharge: 2019-12-03 | Disposition: A | Discharge status: Home / Self Care | Payer: Managed Care, Other (non HMO) | Source: Ambulatory Visit | Attending: Obstetrics and Gynecology | Admitting: Obstetrics and Gynecology

## 2019-12-03 ENCOUNTER — Other Ambulatory Visit: Payer: Self-pay | Admitting: Obstetrics and Gynecology

## 2019-12-03 ENCOUNTER — Other Ambulatory Visit: Payer: Self-pay

## 2019-12-03 DIAGNOSIS — O99353 Diseases of the nervous system complicating pregnancy, third trimester: Secondary | ICD-10-CM | POA: Diagnosis not present

## 2019-12-03 DIAGNOSIS — F329 Major depressive disorder, single episode, unspecified: Secondary | ICD-10-CM | POA: Insufficient documentation

## 2019-12-03 DIAGNOSIS — O99513 Diseases of the respiratory system complicating pregnancy, third trimester: Secondary | ICD-10-CM | POA: Diagnosis not present

## 2019-12-03 DIAGNOSIS — J45909 Unspecified asthma, uncomplicated: Secondary | ICD-10-CM | POA: Diagnosis not present

## 2019-12-03 DIAGNOSIS — D508 Other iron deficiency anemias: Secondary | ICD-10-CM | POA: Diagnosis not present

## 2019-12-03 DIAGNOSIS — O0990 Supervision of high risk pregnancy, unspecified, unspecified trimester: Secondary | ICD-10-CM

## 2019-12-03 DIAGNOSIS — E669 Obesity, unspecified: Secondary | ICD-10-CM | POA: Insufficient documentation

## 2019-12-03 DIAGNOSIS — Z7951 Long term (current) use of inhaled steroids: Secondary | ICD-10-CM | POA: Insufficient documentation

## 2019-12-03 DIAGNOSIS — O34219 Maternal care for unspecified type scar from previous cesarean delivery: Secondary | ICD-10-CM

## 2019-12-03 DIAGNOSIS — O0993 Supervision of high risk pregnancy, unspecified, third trimester: Secondary | ICD-10-CM | POA: Insufficient documentation

## 2019-12-03 DIAGNOSIS — O0992 Supervision of high risk pregnancy, unspecified, second trimester: Secondary | ICD-10-CM

## 2019-12-03 DIAGNOSIS — O99013 Anemia complicating pregnancy, third trimester: Secondary | ICD-10-CM | POA: Diagnosis not present

## 2019-12-03 DIAGNOSIS — J4550 Severe persistent asthma, uncomplicated: Secondary | ICD-10-CM

## 2019-12-03 DIAGNOSIS — Z98891 History of uterine scar from previous surgery: Secondary | ICD-10-CM | POA: Diagnosis not present

## 2019-12-03 DIAGNOSIS — E282 Polycystic ovarian syndrome: Secondary | ICD-10-CM | POA: Insufficient documentation

## 2019-12-03 DIAGNOSIS — O24414 Gestational diabetes mellitus in pregnancy, insulin controlled: Secondary | ICD-10-CM | POA: Diagnosis not present

## 2019-12-03 DIAGNOSIS — O99283 Endocrine, nutritional and metabolic diseases complicating pregnancy, third trimester: Secondary | ICD-10-CM | POA: Diagnosis not present

## 2019-12-03 DIAGNOSIS — O99213 Obesity complicating pregnancy, third trimester: Secondary | ICD-10-CM | POA: Diagnosis not present

## 2019-12-03 DIAGNOSIS — Z79899 Other long term (current) drug therapy: Secondary | ICD-10-CM | POA: Insufficient documentation

## 2019-12-03 DIAGNOSIS — O24319 Unspecified pre-existing diabetes mellitus in pregnancy, unspecified trimester: Secondary | ICD-10-CM

## 2019-12-03 DIAGNOSIS — G4733 Obstructive sleep apnea (adult) (pediatric): Secondary | ICD-10-CM

## 2019-12-03 DIAGNOSIS — O99343 Other mental disorders complicating pregnancy, third trimester: Secondary | ICD-10-CM | POA: Diagnosis not present

## 2019-12-03 DIAGNOSIS — Z6839 Body mass index (BMI) 39.0-39.9, adult: Secondary | ICD-10-CM

## 2019-12-03 DIAGNOSIS — O99210 Obesity complicating pregnancy, unspecified trimester: Secondary | ICD-10-CM

## 2019-12-03 DIAGNOSIS — Z3A30 30 weeks gestation of pregnancy: Secondary | ICD-10-CM | POA: Diagnosis not present

## 2019-12-03 LAB — GLUCOSE, CAPILLARY: Glucose-Capillary: 155 mg/dL — ABNORMAL HIGH (ref 70–99)

## 2019-12-03 NOTE — Consult Note (Signed)
Maternal-Fetal Medicine   Name: Makayah Pauli DOB: 05/28/1986 MRN: [id number] Referring Provider: Adrian Prows, MD  I had the pleasure of seeing Shelby Aguilar today at the [facility], [facility]. She is here for ultrasound and consultation. She has gestational diabetes.  Prenatal course: Gestational diabetes was diagnosed on 3-hour GTT. Patient takes insulin NPH 30 units in the morning and 40 units at night. She also takes Humalog 6/6/6 units with meals. I reviewed her blood glucose log (recorded till 11/26/19 and rest verbal). Fasting levels range 81 to 104 mg/dL (124 mg outlier). All her postprandial levels are within normal range. She checks fasting and 2-hour postprandial. Patient does not get up at night to snack.  Her prenatal course has been, otherwise, uneventful. On cell-free fetal DNA screening, the risks of fetal aneuploidies are not increased. Her blood pressures have been normal at prenatal visits. She has mild iron-deficient anemia.  PMH: No history of hypertension or diabetes. She has asthma and takes albuterol and Advair (?) and reports that her asthma is well-controlled with medications. She does not give history of intubations in the past. She gets up frequently at night (more from sleep apnea than asthma). Patient reports she has a diagnosis of sleep apnea and will be undergoing sleep study. She was also told that she has goiter but no ultrasound confirmation was made. No history of hyper- or hypothyroidism. She also has a diagnosis of depression and takes sertraline.  PSH: Cesarean deliveries (2) Medications: Prenatal vitamins, sertraline 50 mg daily, insulin, Advair, albuterol inhalers. Planning iron supplements. Allergies: NKDA. Social: Denies tobacco or drug or alcohol use. She is married and her husband (Sales promotion account executive) is in good health. 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  Obstetric history: 11/2011: Term cesarean (39 weeks) delivery (failure  to progress in labor) of a female infant weighing 3,997 g (8-13) at birth. Labor was induced because of diagnosis of PUPP.  07/2018: VBAC attempted (failed TOLAC). Repeat cesarean delivery of a female infant weighing 4,330 g (9-9) at birth. Both her children are in good health.  Gyn history: History of abnormal Pap smears in the past (recent screening negative). No history of cervical surgeries. Patient reports she had irregular cycles (PCOS).  Ultrasound We performed a fetal anatomy scan. No obvious fetal structural defects are seen. Fetal anatomical survey is limited because of advanced gestational age. Fetal biometry is consistent with her previously-established dates. Amniotic fluid is normal and good fetal activity is seen. Patient understands the limitations of ultrasound in detecting fetal anomalies.  Placenta is posterior and there is no evidence of previa or accreta.  Our concerns include: Gestational diabetes: -Explained diagnosis based on abnormal 3-hour GTT. -Discussed self-monitoring of blood glucose and normal parameters. -Explained the importance of good blood glucose control to prevent adverse fetal (stillbirth) and neonatal outcomes (hypoglycemia, hyperbilirubinemia, metabolic complications, respiratory-distress syndrome) and prolonged NICU stay all of them more common in poorly-controlled diabetes. -Macrosomia can lead to shoulder dystocia and neurological injuries (Erb's palsy) that can happen even with cesarean delivery.  -Reassured the patient that her blood glucose levels are within normal range except fasting levels. I advised her to increase bedtime insulin NPH to 44 units and gradually increase till fasting levels are below 95 mg/dL. -I discussed hypoglycemia and its corrective measures.  -Postpartum screening is recommended to identify type 2 diabetes that can occur in 25% to 40% of women with gestational diabetes. -Explained ultrasound protocol with fetal growth assessment  in 4 weeks and weekly  BPP from 32 weeks till delivery. Timing of delivery: If GDM is well controlled, I recommend delivery at 39 weeks. If control is poor or suboptimal, early term delivery (37 to 38 weeks may be considered).  Previous cesarean deliveries Increase the risk of placenta previa or accreta in subsequent pregnancies. Repeated abdominal surgeries increase maternal surgical morbidities and long-term complications (intestinal obstruction).  Asthma: Stable. I reassured her that medications prescribed are safe and that she should continue taking inhalers to prevent maternal complications. If steroids are contemplated, inpatient management may be necessary to control hyperglycemia.  Anemia: Oral iron supplements may help in increasing hematocrit levels before cesarean delivery. Ferrous sulfate 325 mg once daily may be sufficient in addition to prenatal vitamins. Blood loss over 1,000 milliliters is expected in cesarean deliveries.  Sertraline No increase in congenital malformations is expected with the use of this medication. It is an SSRI agent. A small absolute increase in primary pulmonary hypertension has been observed. However, untreated depression can be associated with adverse maternal outcomes that can override the above-mentioned risk. Patient was counseled on the small risk for primary pulmonary hypertension.   Sertraline is excreted into breast milk and long-term effects on the infant are not known. The American Academy of Pediatrics classifies this as a drug whose effect on the newborn is unknown, but may be of concern. Discuss with pediatricians after delivery. Obstructive sleep apnea (OSA): The incidence of OSA in pregnancy is quoted between 1% and 6%. Obesity in her case is a major contributing factor. In addition, pregnancy increases nasal congestion that aggravates OSA. Progesterone increase in pregnancy increases minute ventilation and can potentially worsen OSA. Patient  consulted a sleep specialist at [facility] a few years ago and we do not have information on the severity of her condition (Scoring). In pregnancy, preeclampsia and gestational diabetes are more frequent in women with OSA. However, fetal growth restriction or stillbirth is not increased.  During labor, administration of narcotics and sedatives can cause apnea. Early placement of epidural analgesia should be considered so that emergency cesarean section need not be performed under general anesthesia. Proper positioning of the patient during labor is also to be considered. I strongly recommended using CPAP machine if recommended by the sleep specialist. Pregnancy does not affect the natural history of OSA. Patient reports she has an appointment to see the sleep specialist. Prenatal:  Briefly discussed COVID vaccine. Patient opted not to take the vaccine.  Recommendations: -An appointment was made for her to return in 2 weeks for BPP. Patient to bring her blood glucose log to discuss control. -Weekly BPP from 32 weeks till delivery. BPP may be performed at your office or at the Maternal Fetal Care, [facility]. -Fetal growth assessment in 4 weeks. -Continue insulin. Increase bedtime insulin to 44 units and then gradually (by 2 to 4 units) till fasting levels are below 95 mg/dL. -Postpartum screening (6 to 12 weeks) for type 2 diabetes with fasting and postprandial glucose levels after 75 grams glucose load. -Repeat cesarean delivery at 39 weeks if diabetes is well controlled. Early term delivery at 37 to 38 weeks is appropriate if blood glucose control is suboptimal. -TSH levels. -Sleep specialist consultation to address OSA. -Iron supplement (ferrous sulfate 325 mg once daily) may be considered. -Continue sertraline 50 mg daily. -Continue albuterol and Advair.  Thank you for consultation. If you have any questions, please contact me at the Maternal Fetal Care, [facility].  Consultation including face-to-face  counseling: 60 minutes.

## 2019-12-13 ENCOUNTER — Other Ambulatory Visit: Payer: Self-pay | Admitting: Obstetrics and Gynecology

## 2019-12-13 ENCOUNTER — Other Ambulatory Visit: Payer: Self-pay | Admitting: Maternal & Fetal Medicine

## 2019-12-13 DIAGNOSIS — O34219 Maternal care for unspecified type scar from previous cesarean delivery: Secondary | ICD-10-CM

## 2019-12-13 DIAGNOSIS — O99213 Obesity complicating pregnancy, third trimester: Secondary | ICD-10-CM

## 2019-12-13 DIAGNOSIS — O99323 Drug use complicating pregnancy, third trimester: Secondary | ICD-10-CM

## 2019-12-13 DIAGNOSIS — O24414 Gestational diabetes mellitus in pregnancy, insulin controlled: Secondary | ICD-10-CM

## 2019-12-14 ENCOUNTER — Other Ambulatory Visit: Payer: Self-pay

## 2019-12-14 ENCOUNTER — Ambulatory Visit (INDEPENDENT_AMBULATORY_CARE_PROVIDER_SITE_OTHER): Payer: Managed Care, Other (non HMO) | Admitting: Obstetrics and Gynecology

## 2019-12-14 ENCOUNTER — Encounter: Payer: Self-pay | Admitting: Obstetrics and Gynecology

## 2019-12-14 VITALS — BP 108/60 | Wt 243.0 lb

## 2019-12-14 DIAGNOSIS — Z3A31 31 weeks gestation of pregnancy: Secondary | ICD-10-CM

## 2019-12-14 DIAGNOSIS — R0683 Snoring: Secondary | ICD-10-CM

## 2019-12-14 DIAGNOSIS — O0990 Supervision of high risk pregnancy, unspecified, unspecified trimester: Secondary | ICD-10-CM

## 2019-12-14 DIAGNOSIS — G4730 Sleep apnea, unspecified: Secondary | ICD-10-CM

## 2019-12-14 DIAGNOSIS — Z98891 History of uterine scar from previous surgery: Secondary | ICD-10-CM

## 2019-12-14 DIAGNOSIS — Z23 Encounter for immunization: Secondary | ICD-10-CM | POA: Diagnosis not present

## 2019-12-14 DIAGNOSIS — J4550 Severe persistent asthma, uncomplicated: Secondary | ICD-10-CM

## 2019-12-14 DIAGNOSIS — O24319 Unspecified pre-existing diabetes mellitus in pregnancy, unspecified trimester: Secondary | ICD-10-CM

## 2019-12-14 DIAGNOSIS — O99210 Obesity complicating pregnancy, unspecified trimester: Secondary | ICD-10-CM

## 2019-12-14 LAB — POCT URINALYSIS DIPSTICK OB
Glucose, UA: NEGATIVE
POC,PROTEIN,UA: NEGATIVE

## 2019-12-14 MED ORDER — INSULIN ISOPHANE HUMAN 100 UNIT/ML KWIKPEN: PEN_INJECTOR | SUBCUTANEOUS | 11 refills | Status: DC

## 2019-12-14 MED ORDER — BD PEN NEEDLE MICRO U/F 32G X 6 MM MISC: 11 refills | Status: DC

## 2019-12-14 MED ORDER — INSULIN LISPRO (1 UNIT DIAL) 100 UNIT/ML (KWIKPEN): 10.0000 [IU] | PEN_INJECTOR | Freq: Three times a day (TID) | SUBCUTANEOUS | 11 refills | Status: DC

## 2019-12-14 NOTE — Progress Notes (Signed)
ROB/TDap/BT consent- no concerns

## 2019-12-15 NOTE — Progress Notes (Signed)
Routine Prenatal Care Visit  Subjective  Shelby Aguilar is a 34 y.o. G3P2002 at [redacted]w[redacted]d being seen today for ongoing prenatal care.  She is currently monitored for the following issues for this high-risk pregnancy and has GOITER, UNSPECIFIED; PCOD (polycystic ovarian disease); Obesity affecting pregnancy, antepartum; Supervision of high risk pregnancy, antepartum; History of 2 cesarean sections; BMI 39.0-39.9,adult; Pregestational diabetes mellitus, modified White class B; Amenorrhea; and Severe persistent asthma without complication on their problem list.  ----------------------------------------------------------------------------------- Patient reports no complaints.   Contractions: Not present. Vag. Bleeding: None.  Movement: Present. Denies leaking of fluid.  ----------------------------------------------------------------------------------- The following portions of the patient's history were reviewed and updated as appropriate: allergies, current medications, past family history, past medical history, past social history, past surgical history and problem list. Problem list updated.   Objective  Blood pressure 108/60, weight 243 lb (110.2 kg), last menstrual period 04/23/2019, currently breastfeeding. Pregravid weight 220 lb (99.8 kg) Total Weight Gain 23 lb (10.4 kg) Urinalysis:      Fetal Status: Fetal Heart Rate (bpm): 160   Movement: Present     General:  Alert, oriented and cooperative. Patient is in no acute distress.  Skin: Skin is warm and dry. No rash noted.   Cardiovascular: Normal heart rate noted  Respiratory: Normal respiratory effort, no problems with respiration noted  Abdomen: Soft, gravid, appropriate for gestational age. Pain/Pressure: Absent     Pelvic:  Cervical exam deferred        Extremities: Normal range of motion.  Edema: None  Mental Status: Normal mood and affect. Normal behavior. Normal judgment and thought content.     Assessment   34 y.o.  [id number] at [redacted]w[redacted]d by  02/11/2020, by Ultrasound presenting for routine prenatal visit  Plan   pregnancy Problems (from 04/23/19 to present)    Problem Noted Resolved   Severe persistent asthma without complication 11/14/2019 by Natale Milch, MD No   Overview Signed 11/14/2019  9:30 AM by Natale Milch, MD    11/14/2019- Advair 250/50 2 puffs BID initiated, pulmonology referral made, advised patient to purchase peak flow meter       BMI 39.0-39.9,adult 07/19/2019 by Conard Novak, MD No   History of 2 cesarean sections 07/10/2019 by Nadara Mustard, MD No   Overview Addendum 11/14/2019  9:32 PM by Natale Milch, MD    Repeat cesarean scheduled [redacted] wk gestation 02/06/2020      Previous Version   Obesity affecting pregnancy, antepartum 11/19/2017 by Tresea Mall, CNM No   Overview Signed 07/10/2019  2:47 PM by Nadara Mustard, MD    BMI >=40 [ ]  early 1h gtt -  [ ]  u/s for dating [ ]   [ ]  nutritional goals [ ]  folic acid 1mg  [ ]  bASA (>12 weeks) [ ]  consider nutrition consult [ ]  consider maternal EKG 1st trimester [ ]  Growth u/s 28 [ ] , 32 [ ] , 36 weeks [ ]  [ ]  NST/AFI weekly 36+ weeks (36[] , 37[] , 38[] )       Supervision of high risk pregnancy, antepartum 11/19/2017 by , CNM No   Overview Addendum 08/15/2019  2:01 PM by , MD    Clinic [facility] Prenatal Labs  Dating 10 week Blood type: A/Positive/-- (01/19 1452)   Genetic Screen  NIPS: XX Antibody:Negative (01/19 1452)  Anatomic  Rubella: 10.70 (01/19 1452) Varicella: Imm  GTT Early: 147 3 hr GTT 103  / 189 / 160 /149 RPR: Non  Reactive (01/19 1452)   Rhogam N/A HBsAg: Negative (01/19 1452)   TDaP vaccine          Flu Shot:03/2019 HIV: Non Reactive (01/19 1452)   Baby Food     Breast                           GBS:   Contraception  Pap:07/10/19  CBB  No   CS/VBAC CS x2, plans CS   Support Person Husband [place]            Previous Version       Glucose log  reviewed. 13/22 values elevated. Patient missing many values for lunch and dinner postprandial values. She reports she is becoming fatigued and forgetful about multiple glucose checks.  Increase insulin to NP 30 units AM, 50 units HS and 03/30/09 with meals  Sleep study reordered. Office scheduler aware of referral.  Start weekly BPP next week.  Patient is not taking iron. She is taking a lot of other oral medications and has not added that. She has follow up with hematology planned.  Reports symptoms of asthma are improved with advair.  Growth [place] at 34 weeks.  Gestational age appropriate obstetric precautions including but not limited to vaginal bleeding, contractions, leaking of fluid and fetal movement were reviewed in detail with the patient.    Return in about 1 week (around 12/21/2019) for ROB in person and BPP [place].  Homero Fellers MD [facility], [facility] 12/15/2019, 1:55 AM

## 2019-12-17 ENCOUNTER — Ambulatory Visit
Admission: RE | Admit: 2019-12-17 | Discharge: 2019-12-17 | Disposition: A | Discharge status: Home / Self Care | Payer: Managed Care, Other (non HMO) | Source: Ambulatory Visit | Attending: Obstetrics and Gynecology | Admitting: Obstetrics and Gynecology

## 2019-12-17 ENCOUNTER — Other Ambulatory Visit: Payer: Self-pay

## 2019-12-17 DIAGNOSIS — O99210 Obesity complicating pregnancy, unspecified trimester: Secondary | ICD-10-CM

## 2019-12-17 DIAGNOSIS — Z6839 Body mass index (BMI) 39.0-39.9, adult: Secondary | ICD-10-CM

## 2019-12-17 DIAGNOSIS — O0990 Supervision of high risk pregnancy, unspecified, unspecified trimester: Secondary | ICD-10-CM

## 2019-12-17 DIAGNOSIS — O34219 Maternal care for unspecified type scar from previous cesarean delivery: Secondary | ICD-10-CM | POA: Diagnosis not present

## 2019-12-17 DIAGNOSIS — O99213 Obesity complicating pregnancy, third trimester: Secondary | ICD-10-CM

## 2019-12-17 DIAGNOSIS — O99323 Drug use complicating pregnancy, third trimester: Secondary | ICD-10-CM

## 2019-12-17 DIAGNOSIS — Z3A32 32 weeks gestation of pregnancy: Secondary | ICD-10-CM | POA: Diagnosis not present

## 2019-12-17 DIAGNOSIS — E669 Obesity, unspecified: Secondary | ICD-10-CM | POA: Insufficient documentation

## 2019-12-17 DIAGNOSIS — O24414 Gestational diabetes mellitus in pregnancy, insulin controlled: Secondary | ICD-10-CM | POA: Diagnosis not present

## 2019-12-17 DIAGNOSIS — D649 Anemia, unspecified: Secondary | ICD-10-CM | POA: Insufficient documentation

## 2019-12-17 DIAGNOSIS — Z98891 History of uterine scar from previous surgery: Secondary | ICD-10-CM

## 2019-12-17 DIAGNOSIS — J4550 Severe persistent asthma, uncomplicated: Secondary | ICD-10-CM

## 2019-12-17 LAB — GLUCOSE, CAPILLARY: Glucose-Capillary: 131 mg/dL — ABNORMAL HIGH (ref 70–99)

## 2019-12-17 NOTE — Consult Note (Addendum)
Follow-up Consultation  Shelby Aguilar MRN: [id number]  Patient with gestational diabetes returned for antenatal testing. She had detailed MFM consultation 2 weeks ago to discuss gestational diabetes management and other issues including sleep apnea and sertraline.  Since her last visit with [place], her blood glucose levels were reviewed by you (12/14/19) and you had increased bedtime insulin NPH to 50 units and Humalog to 03/30/09 with meals. Morning NPH dosage of 30 units is unchanged.  Her blood pressure today at our office is 112/73 mm Hg.  On today's ultrasound, amniotic fluid is normal and good fetal activity is seen. Cephalic presentation. Antenatal testing is reassuring. BPP 8/8.  I reassured the patient of the findings.  I reviewed her blood glucose log. Although she has been checking her fasting glucose levels regularly, other postprandial entries are sporadic (but within normal range). Her fasting levels are still increased (101, 120, 105 mg/dL).  I discussed the importance of good blood glucose levels in preventing adverse fetal and neonatal outcomes. I recommended that she increase bedtime insulin NPH to 54 units.   Timing of delivery: Suboptimal control is slightly increased with risks including stillbirth. It is reasonable to consider delivery between 37- and 39 weeks' gestation if blood glucose levels are not well controlled. If well controlled, our goal is to deliver at 39 weeks.  Patient has an appointment with a hematologist tomorrow (anemia) and will be making an appointment with a sleep specialist.  Recommendations: -Continue weekly BPP till delivery. -Fetal growth in 2 weeks. -Increase bedtime insulin NPH to 54 units.  Thank you for consultation. Please contact me at the Maternal Fetal Care, [facility] if you have any questions. Consultation including face-to-face counseling: 20 minutes.

## 2019-12-17 NOTE — Progress Notes (Signed)
[facility]  [address] [place], [place] [address] Phone: [phone]  Fax: [phone]   Clinic Day:  12/18/2019  Referring physician: Natale Aguilar, *  Chief Complaint: Shelby Aguilar is a 34 y.o. female  with anemia during the third trimester of pregnancy who is referred in consultation by Shelby Aguilar for assessment and management.   HPI:  The patient notes iron deficiency when she started college.  She describes heavy periods. The patient was on birth control pills for a little while and after she had her first son, she used Implanon and did not have any periods.  She has a history of two C-sections on 12/15/2011 and 07/26/2018. She hemorrhaged during her most recent C-section but did not require a transfusion. She is unsure if she was iron deficient during those pregnancies.  The patient is currently [redacted] weeks pregnant.  She is currently in the third trimester.  Her baby, Shelby Aguilar, is very active. The patient thinks that her C-section date might be pushed up because of her diabetes.  She is felt to have a high risk pregnancy.  BMI is 39.0-39.9.  Medical problems include goiter, polycystic ovary disease, obesity, pregestational diabetes mellitus and severe persistent asthma.     She was seen by Shelby Aguilar on 12/14/2019.  At that time, she was not taking oral iron.  She is scheduled for a C section on 02/06/2020.  Labs have been followed: 07/10/2019:  Hematocrit 35.1, hemoglobin 12.0, MCV 80, platelets 300,000, WBC 12,100.   11/14/2019:  Hematocrit 33.1, hemoglobin 10.7, MCV 80, platelets ----------, WBC 12,700 (ANC 10,100).    Ferritin was 16 with an iron saturation of 9% and a TIBC of 400 on 11/29/2009.  Hemoglobin electrophoresis was normal on 07/10/2019 and included a HgA 97.9% and Hgb A2 2.1%.  Symptomatically, she is experiencing fatigue, constipation, and occasional dizziness which are all worse with her pregnancy. She  reports night sweats and that she is "always hot and sweating" during the day. She has frequent headaches and her most recent headache was yesterday evening x 4 hours. She has asthma and reports shortness of breath on exertion and palpitations. She denies fevers, chills, chest pain, urinary symptoms, bone/joint symptoms, skin changes, bleeding of any kind, or weight loss. She has had poor eyesight since middle school.  The patient was instructed to start taking OTC oral iron but has not been to the store to buy it yet. She has been taking a prenatal vitamin irregularly.  The patient tries to follow a low carb diet because of diabetes. She does not over eat. Every now and then, she eats dark leafy green vegetables. She craves steak. She denies ice pica. In 2017 or 2018, the patient received vitamin B12 injections but stopped because she started trying to get pregnant.   She reports that she has been told that her thyroid is large since she was 34 years old but nothing has been done about it. She is interested in thyroid function tests.  The patient denies a family history of bleeding disorders or anemia.    Past Medical History:  Diagnosis Date  . Asthma   . Depression   . Family history of breast cancer    1/21 cancer genetic tesitng letter sent  . Gestational diabetes   . PCOS (polycystic ovarian syndrome)     Past Surgical History:  Procedure Laterality Date  . CESAREAN SECTION  2013  . CESAREAN SECTION N/A 07/25/2018  Procedure: CESAREAN SECTION;  Surgeon: Shelby Milch, MD;  Location: [facility] ORS;  Service: Obstetrics;  Laterality: N/A;    Family History  Problem Relation Age of Onset  . Breast cancer Paternal Grandmother 36  . Diabetes Father   . Diabetes Sister     Social History:  reports that she has never smoked. She has never used smokeless tobacco. She reports previous alcohol use. She reports that she does not use drugs.She denies tobacco use. Outside of pregnancy,  she drinks alcohol socially. 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 She has an 32 year old and a 83 month old, [name]. The patient is alone today.  Allergies: No Known Allergies  Current Medications: Current Outpatient Medications  Medication Sig Dispense Refill  . albuterol (VENTOLIN HFA) 108 (90 Base) MCG/ACT inhaler Inhale 2 puffs into the lungs every 4 (four) hours as needed for wheezing or shortness of breath. 6.7 g 2  . Fluticasone-Salmeterol (ADVAIR DISKUS) 250-50 MCG/DOSE AEPB Inhale 2 puffs into the lungs in the morning and at bedtime. 60 each 6  . glucose blood test strip Use as instructed 125 each 5  . insulin lispro (HUMALOG KWIKPEN) 100 UNIT/ML KwikPen Inject 0.1 mLs (10 Units total) into the skin 3 (three) times daily before meals. 15 mL 11  . Insulin NPH, Human,, Isophane, (HUMULIN N) 100 UNIT/ML Kiwkpen 30 units in the morning, 50 units at night 30 mL 11  . Insulin Pen Needle (BD PEN NEEDLE MICRO U/F) 32G X 6 MM MISC USE AS DIRECTED TO INJECT INSULIN 100 each 11  . Lancets (ONETOUCH ULTRASOFT) lancets Use as instructed 200 each 5  . Prenatal Vit-Fe Fumarate-FA (MULTIVITAMIN-PRENATAL) 27-0.8 MG TABS tablet Take 1 tablet by mouth daily.     . sertraline (ZOLOFT) 50 MG tablet TAKE 1 TABLET BY MOUTH EVERY DAY 90 tablet 1   No current facility-administered medications for this visit.    Review of Systems  Constitutional: Positive for diaphoresis (night sweats; also sweats during the day) and malaise/fatigue. Negative for chills, fever and weight loss.  HENT: Negative for congestion, ear discharge, ear pain, hearing loss, nosebleeds, sinus pain, sore throat and tinnitus.   Eyes: Negative for blurred vision.       Poor vision, chronic  Respiratory: Positive for shortness of breath (on exertion). Negative for cough, hemoptysis and sputum production.        Asthma  Cardiovascular: Positive for palpitations. Negative for chest pain and leg swelling.    Gastrointestinal: Positive for constipation. Negative for abdominal pain, blood in stool, diarrhea, heartburn, melena, nausea and vomiting.       Follows low carb diet. Craves steak. Denies ice pica.  Genitourinary: Negative for dysuria, frequency, hematuria and urgency.  Musculoskeletal: Negative for back pain, myalgias and neck pain.  Skin: Negative for itching and rash.  Neurological: Positive for dizziness and headaches (frequent). Negative for tingling, sensory change and weakness.  Endo/Heme/Allergies: Does not bruise/bleed easily.  Psychiatric/Behavioral: Negative for depression and memory loss. The patient is not nervous/anxious and does not have insomnia.   All other systems reviewed and are negative.  Performance status (ECOG): 0-1  Vitals Last menstrual period 04/23/2019, currently breastfeeding.   Physical Exam Vitals and nursing note reviewed.  Constitutional:      General: She is not in acute distress.    Appearance: She is not diaphoretic.  HENT:     Head: Normocephalic and atraumatic.     Mouth/Throat:  Mouth: Mucous membranes are moist.     Pharynx: Oropharynx is clear.  Eyes:     General: No scleral icterus.    Extraocular Movements: Extraocular movements intact.     Conjunctiva/sclera: Conjunctivae normal.     Pupils: Pupils are equal, round, and reactive to light.     Comments: Brown eyes.  Neck:     Thyroid: Thyromegaly present.  Cardiovascular:     Rate and Rhythm: Normal rate and regular rhythm.     Pulses: Normal pulses.     Heart sounds: Normal heart sounds. No murmur heard.   Pulmonary:     Effort: Pulmonary effort is normal. No respiratory distress.     Breath sounds: Wheezing (soft inspiratory) present. No rales.  Chest:     Chest wall: No tenderness.  Abdominal:     General: Bowel sounds are normal. There is no distension.     Palpations: Abdomen is soft. There is no mass.     Tenderness: There is no abdominal tenderness. There is no  guarding or rebound.     Comments: Pregnant.  Musculoskeletal:        General: No swelling or tenderness. Normal range of motion.     Cervical back: Normal range of motion and neck supple.  Skin:    General: Skin is warm and dry.  Neurological:     Mental Status: She is alert and oriented to person, place, and time. Mental status is at baseline.  Psychiatric:        Mood and Affect: Mood normal.        Behavior: Behavior normal.        Thought Content: Thought content normal.        Judgment: Judgment normal.    No visits with results within 3 Day(s) from this visit.  Latest known visit with results is:  Routine Prenatal on 12/14/2019  Component Date Value Ref Range Status  . Glucose, UA 12/14/2019 Negative  Negative Final  . POC,PROTEIN,UA 12/14/2019 Negative  Negative, Trace, Small (1+), Moderate (2+), Large (3+), 4+ Final    Assessment:  AMARRI MICHAELSON is a 34 y.o. female currently [redacted] weeks pregnant with a normocytic anemia.  She is scheduled for a C section on 02/06/2020.  CBC on 11/14/2019 inlcuded a hematocrit 33.1, hemoglobin 10.7, MCV 80, and WBC 12,700 (ANC 10,100).  Ferritin was 16 with an iron saturation of 9% and a TIBC of 400 on 11/29/2009.  Hemoglobin electrophoresis was normal on 07/10/2019.  She has a history of B12 deficiency.  She was previously on B12 injections.  B12 was 159 on 11/30/2019.  Medical problems include goiter, polycystic ovary disease, obesity, pregestational diabetes mellitus and severe persistent asthma.     Symptomatically, she is fatigued.  She has shortness of breath on exertion.  Plan: 1.   Labs today:  CBC with diff, retic, B12, folate, ferritin, iron studies. 2.   Normocytic anemia  Review recent labs.  She has a normocytic anemia likely secondary to iron deficiency and B12 deficiency.  Discuss baseline labs.  Encourage patient to take oral iron with OJ or vitamin C.   Begin 1 tablet a day and if tolerated can increase to  BID.   Discuss potential for GI upset and constipation.   Discuss a 2 week trial.  If ineffective, discuss IV iron.  Preauth Venofer.   Information provided. 3.   History of B12 deficiency  Discuss rechecking B12 level.  Discuss oral B12 vs B12 injections.  Contact [facility]. 4.   Thyromegaly  Check TSH and free T4.  Consider thyroid ultrasound. 5.   RTC in 2 weeks for MD assessment, labs (CBC with diff, iron studies, retic), and +/- Venofer.  I discussed the assessment and treatment plan with the patient.  The patient was provided an opportunity to ask questions and all were answered.  The patient agreed with the plan and demonstrated an understanding of the instructions.  The patient was advised to call back if the symptoms worsen or if the condition fails to improve as anticipated.   Shelby Chisenhall C. Merlene Pulling, MD, PhD    12/18/2019, 9:54 AM  I, Danella Penton Tufford, am acting as Neurosurgeon for [employer]. Merlene Pulling, MD, PhD.  I, Antony Sian C. Merlene Pulling, MD, have reviewed the above documentation for accuracy and completeness, and I agree with the above.

## 2019-12-18 ENCOUNTER — Inpatient Hospital Stay: Payer: Managed Care, Other (non HMO) | Attending: Hematology and Oncology | Admitting: Hematology and Oncology

## 2019-12-18 ENCOUNTER — Encounter: Payer: Self-pay | Admitting: Hematology and Oncology

## 2019-12-18 ENCOUNTER — Inpatient Hospital Stay: Payer: Managed Care, Other (non HMO)

## 2019-12-18 VITALS — BP 125/66 | HR 107 | Temp 95.5°F | Resp 18 | Wt 238.1 lb

## 2019-12-18 DIAGNOSIS — F329 Major depressive disorder, single episode, unspecified: Secondary | ICD-10-CM | POA: Diagnosis not present

## 2019-12-18 DIAGNOSIS — D649 Anemia, unspecified: Secondary | ICD-10-CM | POA: Diagnosis not present

## 2019-12-18 DIAGNOSIS — Z79899 Other long term (current) drug therapy: Secondary | ICD-10-CM | POA: Diagnosis not present

## 2019-12-18 DIAGNOSIS — O99213 Obesity complicating pregnancy, third trimester: Secondary | ICD-10-CM | POA: Insufficient documentation

## 2019-12-18 DIAGNOSIS — Z794 Long term (current) use of insulin: Secondary | ICD-10-CM | POA: Diagnosis not present

## 2019-12-18 DIAGNOSIS — E282 Polycystic ovarian syndrome: Secondary | ICD-10-CM | POA: Insufficient documentation

## 2019-12-18 DIAGNOSIS — Z7951 Long term (current) use of inhaled steroids: Secondary | ICD-10-CM | POA: Insufficient documentation

## 2019-12-18 DIAGNOSIS — O99013 Anemia complicating pregnancy, third trimester: Secondary | ICD-10-CM

## 2019-12-18 DIAGNOSIS — J4550 Severe persistent asthma, uncomplicated: Secondary | ICD-10-CM | POA: Insufficient documentation

## 2019-12-18 DIAGNOSIS — E538 Deficiency of other specified B group vitamins: Secondary | ICD-10-CM

## 2019-12-18 DIAGNOSIS — O24113 Pre-existing diabetes mellitus, type 2, in pregnancy, third trimester: Secondary | ICD-10-CM | POA: Diagnosis not present

## 2019-12-18 DIAGNOSIS — E010 Iodine-deficiency related diffuse (endemic) goiter: Secondary | ICD-10-CM

## 2019-12-18 LAB — IRON AND TIBC
Iron: 24 ug/dL — ABNORMAL LOW (ref 28–170)
Saturation Ratios: 5 % — ABNORMAL LOW (ref 10.4–31.8)
TIBC: 448 ug/dL (ref 250–450)
UIBC: 424 ug/dL

## 2019-12-18 LAB — CBC WITH DIFFERENTIAL/PLATELET
Abs Immature Granulocytes: 0.07 10*3/uL (ref 0.00–0.07)
Basophils Absolute: 0 10*3/uL (ref 0.0–0.1)
Basophils Relative: 0 %
Eosinophils Absolute: 0.3 10*3/uL (ref 0.0–0.5)
Eosinophils Relative: 2 %
HCT: 32.5 % — ABNORMAL LOW (ref 36.0–46.0)
Hemoglobin: 10.8 g/dL — ABNORMAL LOW (ref 12.0–15.0)
Immature Granulocytes: 1 %
Lymphocytes Relative: 12 %
Lymphs Abs: 1.5 10*3/uL (ref 0.7–4.0)
MCH: 25.5 pg — ABNORMAL LOW (ref 26.0–34.0)
MCHC: 33.2 g/dL (ref 30.0–36.0)
MCV: 76.8 fL — ABNORMAL LOW (ref 80.0–100.0)
Monocytes Absolute: 0.6 10*3/uL (ref 0.1–1.0)
Monocytes Relative: 5 %
Neutro Abs: 9.9 10*3/uL — ABNORMAL HIGH (ref 1.7–7.7)
Neutrophils Relative %: 80 %
Platelets: 231 10*3/uL (ref 150–400)
RBC: 4.23 MIL/uL (ref 3.87–5.11)
RDW: 14.6 % (ref 11.5–15.5)
WBC: 12.3 10*3/uL — ABNORMAL HIGH (ref 4.0–10.5)
nRBC: 0 % (ref 0.0–0.2)

## 2019-12-18 LAB — T4, FREE: Free T4: 0.72 ng/dL (ref 0.61–1.12)

## 2019-12-18 LAB — RETICULOCYTES
Immature Retic Fract: 32.8 % — ABNORMAL HIGH (ref 2.3–15.9)
RBC.: 4.26 MIL/uL (ref 3.87–5.11)
Retic Count, Absolute: 75.8 10*3/uL (ref 19.0–186.0)
Retic Ct Pct: 1.8 % (ref 0.4–3.1)

## 2019-12-18 LAB — VITAMIN B12: Vitamin B-12: 132 pg/mL — ABNORMAL LOW (ref 180–914)

## 2019-12-18 LAB — FERRITIN: Ferritin: 10 ng/mL — ABNORMAL LOW (ref 11–307)

## 2019-12-18 LAB — FOLATE: Folate: 6.1 ng/mL (ref >5.9)

## 2019-12-18 LAB — TSH: TSH: 0.762 u[IU]/mL (ref 0.350–4.500)

## 2019-12-18 NOTE — Patient Instructions (Signed)

## 2019-12-18 NOTE — Progress Notes (Signed)
Patient has no questions or concerns at this time. She states that she has had issues with low iron and b12 previously.

## 2019-12-19 ENCOUNTER — Telehealth: Payer: Self-pay

## 2019-12-19 ENCOUNTER — Other Ambulatory Visit: Payer: Self-pay

## 2019-12-19 ENCOUNTER — Ambulatory Visit (INDEPENDENT_AMBULATORY_CARE_PROVIDER_SITE_OTHER): Payer: Managed Care, Other (non HMO) | Admitting: Obstetrics and Gynecology

## 2019-12-19 ENCOUNTER — Encounter: Payer: Self-pay | Admitting: Obstetrics and Gynecology

## 2019-12-19 VITALS — BP 118/68 | Ht 63.0 in | Wt 241.2 lb

## 2019-12-19 DIAGNOSIS — O0990 Supervision of high risk pregnancy, unspecified, unspecified trimester: Secondary | ICD-10-CM

## 2019-12-19 DIAGNOSIS — O99210 Obesity complicating pregnancy, unspecified trimester: Secondary | ICD-10-CM

## 2019-12-19 DIAGNOSIS — E538 Deficiency of other specified B group vitamins: Secondary | ICD-10-CM | POA: Insufficient documentation

## 2019-12-19 DIAGNOSIS — Z3A32 32 weeks gestation of pregnancy: Secondary | ICD-10-CM

## 2019-12-19 DIAGNOSIS — O99013 Anemia complicating pregnancy, third trimester: Secondary | ICD-10-CM

## 2019-12-19 DIAGNOSIS — O24319 Unspecified pre-existing diabetes mellitus in pregnancy, unspecified trimester: Secondary | ICD-10-CM

## 2019-12-19 DIAGNOSIS — Z98891 History of uterine scar from previous surgery: Secondary | ICD-10-CM

## 2019-12-19 LAB — POCT URINALYSIS DIPSTICK OB
Glucose, UA: NEGATIVE
POC,PROTEIN,UA: NEGATIVE

## 2019-12-19 LAB — FETAL NONSTRESS TEST

## 2019-12-19 NOTE — Progress Notes (Signed)
Routine Prenatal Care Visit  Subjective  Shelby Aguilar is a 34 y.o. G3P2002 at [redacted]w[redacted]d being seen today for ongoing prenatal care.  She is currently monitored for the following issues for this high-risk pregnancy and has GOITER, UNSPECIFIED; PCOD (polycystic ovarian disease); Obesity affecting pregnancy, antepartum; Supervision of high risk pregnancy, antepartum; History of 2 cesarean sections; BMI 39.0-39.9,adult; Pregestational diabetes mellitus, modified White class B; Amenorrhea; Severe persistent asthma without complication; Normocytic anemia; and B12 deficiency on their problem list.  ----------------------------------------------------------------------------------- Patient reports no complaints.   Contractions: Not present. Vag. Bleeding: None.  Movement: Present. Denies leaking of fluid.  ----------------------------------------------------------------------------------- The following portions of the patient's history were reviewed and updated as appropriate: allergies, current medications, past family history, past medical history, past social history, past surgical history and problem list. Problem list updated.   Objective  Blood pressure 118/68, height 5\' 3"  (1.6 m), weight 241 lb 3.2 oz (109.4 kg), last menstrual period 04/23/2019, currently breastfeeding. Pregravid weight 220 lb (99.8 kg) Total Weight Gain 21 lb 3.2 oz (9.616 kg) Urinalysis:      Fetal Status: Fetal Heart Rate (bpm): 125   Movement: Present     General:  Alert, oriented and cooperative. Patient is in no acute distress.  Skin: Skin is warm and dry. No rash noted.   Cardiovascular: Normal heart rate noted  Respiratory: Normal respiratory effort, no problems with respiration noted  Abdomen: Soft, gravid, appropriate for gestational age. Pain/Pressure: Absent     Pelvic:  Cervical exam deferred        Extremities: Normal range of motion.  Edema: None  Mental Status: Normal mood and affect. Normal  behavior. Normal judgment and thought content.        Assessment   34 y.o. 34 at [redacted]w[redacted]d by  02/11/2020, by Ultrasound presenting for routine prenatal visit  Plan   pregnancy Problems (from 04/23/19 to present)    Problem Noted Resolved   Severe persistent asthma without complication 11/14/2019 by 11/16/2019, MD No   Overview Addendum 12/15/2019  1:50 AM by 12/17/2019, MD    11/14/2019- Advair 250/50 2 puffs BID initiated, pulmonology referral made, advised patient to purchase peak flow meter 12/15/2019- Patient reports her asthma is markedly improved with Advair usage.       Previous Version   BMI 39.0-39.9,adult 07/19/2019 by 07/21/2019, MD No   History of 2 cesarean sections 07/10/2019 by 07/12/2019, MD No   Overview Addendum 11/14/2019  9:32 PM by 11/16/2019, MD    Repeat cesarean scheduled [redacted] wk gestation 02/06/2020      Previous Version   Obesity affecting pregnancy, antepartum 11/19/2017 by 01/19/2018, CNM No   Overview Signed 07/10/2019  2:47 PM by 07/12/2019, MD    BMI >=40 [ ]  early 1h gtt -  [ ]  u/s for dating [ ]   [ ]  nutritional goals [ ]  folic acid 1mg  [ ]  bASA (>12 weeks) [ ]  consider nutrition consult [ ]  consider maternal EKG 1st trimester [ ]  Growth u/s 28 [ ] , 32 [ ] , 36 weeks [ ]  [ ]  NST/AFI weekly 36+ weeks (36[] , 37[] , 38[] )       Supervision of high risk pregnancy, antepartum 11/19/2017 by , CNM No   Overview Addendum 08/15/2019  2:01 PM by , MD    Clinic [facility] Prenatal Labs  Dating 10 week Blood type: A/Positive/-- (01/19 1452)   Genetic Screen  NIPS: XX Antibody:Negative (01/19 1452)  Anatomic [place]  Rubella: 10.70 (01/19 1452) Varicella: Imm  GTT Early: 147 3 hr GTT 103  / 189 / 160 /149 RPR: Non Reactive (01/19 1452)   Rhogam N/A HBsAg: Negative (01/19 1452)   TDaP vaccine          Flu Shot:03/2019 HIV: Non Reactive (01/19 1452)   Baby Food     Breast                            GBS:   Contraception  Pap:07/10/19  CBB  No   CS/VBAC CS x2, plans CS   Support Person Husband [place]            Previous Version       NST reactive today Increase to insulin regimen Still has not heard about sleep study. Mickel Fuchs out of office today- asked patient to call next Tuesday to check with her about it.  Was able to follow with hematology and MFM. Is seeing pulmonary soon. Symptoms improved with Advair. No longer needing albuterol daily.   Gestational age appropriate obstetric precautions including but not limited to vaginal bleeding, contractions, leaking of fluid and fetal movement were reviewed in detail with the patient.    Return in about 1 week (around 12/26/2019) for ROB in person and with MD/ Monday tuesday- weekly for next 5 weeks.  Natale Milch MD [facility], [facility] 12/19/2019, 3:38 PM

## 2019-12-19 NOTE — Patient Instructions (Signed)
Third Trimester of Pregnancy The third trimester is from week 28 through week 40 (months 7 through 9). The third trimester is a time when the unborn baby (fetus) is growing rapidly. At the end of the ninth month, the fetus is about 20 inches in length and weighs 6-10 pounds. Body changes during your third trimester Your body will continue to go through many changes during pregnancy. The changes vary from woman to woman. During the third trimester:  Your weight will continue to increase. You can expect to gain 25-35 pounds (11-16 kg) by the end of the pregnancy.  You may begin to get stretch marks on your hips, abdomen, and breasts.  You may urinate more often because the fetus is moving lower into your pelvis and pressing on your bladder.  You may develop or continue to have heartburn. This is caused by increased hormones that slow down muscles in the digestive tract.  You may develop or continue to have constipation because increased hormones slow digestion and cause the muscles that push waste through your intestines to relax.  You may develop hemorrhoids. These are swollen veins (varicose veins) in the rectum that can itch or be painful.  You may develop swollen, bulging veins (varicose veins) in your legs.  You may have increased body aches in the pelvis, back, or thighs. This is due to weight gain and increased hormones that are relaxing your joints.  You may have changes in your hair. These can include thickening of your hair, rapid growth, and changes in texture. Some women also have hair loss during or after pregnancy, or hair that feels dry or thin. Your hair will most likely return to normal after your baby is born.  Your breasts will continue to grow and they will continue to become tender. A yellow fluid (colostrum) may leak from your breasts. This is the first milk you are producing for your baby.  Your belly button may stick out.  You may notice more swelling in your hands,  face, or ankles.  You may have increased tingling or numbness in your hands, arms, and legs. The skin on your belly may also feel numb.  You may feel short of breath because of your expanding uterus.  You may have more problems sleeping. This can be caused by the size of your belly, increased need to urinate, and an increase in your body's metabolism.  You may notice the fetus "dropping," or moving lower in your abdomen (lightening).  You may have increased vaginal discharge.  You may notice your joints feel loose and you may have pain around your pelvic bone. What to expect at prenatal visits You will have prenatal exams every 2 weeks until week 36. Then you will have weekly prenatal exams. During a routine prenatal visit:  You will be weighed to make sure you and the baby are growing normally.  Your blood pressure will be taken.  Your abdomen will be measured to track your baby's growth.  The fetal heartbeat will be listened to.  Any test results from the previous visit will be discussed.  You may have a cervical check near your due date to see if your cervix has softened or thinned (effaced).  You will be tested for Group B streptococcus. This happens between 35 and 37 weeks. Your health care provider may ask you:  What your birth plan is.  How you are feeling.  If you are feeling the baby move.  If you have had any abnormal   symptoms, such as leaking fluid, bleeding, severe headaches, or abdominal cramping.  If you are using any tobacco products, including cigarettes, chewing tobacco, and electronic cigarettes.  If you have any questions. Other tests or screenings that may be performed during your third trimester include:  Blood tests that check for low iron levels (anemia).  Fetal testing to check the health, activity level, and growth of the fetus. Testing is done if you have certain medical conditions or if there are problems during the pregnancy.  Nonstress test  (NST). This test checks the health of your baby to make sure there are no signs of problems, such as the baby not getting enough oxygen. During this test, a belt is placed around your belly. The baby is made to move, and its heart rate is monitored during movement. What is false labor? False labor is a condition in which you feel small, irregular tightenings of the muscles in the womb (contractions) that usually go away with rest, changing position, or drinking water. These are called Braxton Hicks contractions. Contractions may last for hours, days, or even weeks before true labor sets in. If contractions come at regular intervals, become more frequent, increase in intensity, or become painful, you should see your health care provider. What are the signs of labor?  Abdominal cramps.  Regular contractions that start at 10 minutes apart and become stronger and more frequent with time.  Contractions that start on the top of the uterus and spread down to the lower abdomen and back.  Increased pelvic pressure and dull back pain.  A watery or bloody mucus discharge that comes from the vagina.  Leaking of amniotic fluid. This is also known as your "water breaking." It could be a slow trickle or a gush. Let your health care provider know if it has a color or strange odor. If you have any of these signs, call your health care provider right away, even if it is before your due date. Follow these instructions at home: Medicines  Follow your health care provider's instructions regarding medicine use. Specific medicines may be either safe or unsafe to take during pregnancy.  Take a prenatal vitamin that contains at least 600 micrograms (mcg) of folic acid.  If you develop constipation, try taking a stool softener if your health care provider approves. Eating and drinking   Eat a balanced diet that includes fresh fruits and vegetables, whole grains, good sources of protein such as meat, eggs, or tofu,  and low-fat dairy. Your health care provider will help you determine the amount of weight gain that is right for you.  Avoid raw meat and uncooked cheese. These carry germs that can cause birth defects in the baby.  If you have low calcium intake from food, talk to your health care provider about whether you should take a daily calcium supplement.  Eat four or five small meals rather than three large meals a day.  Limit foods that are high in fat and processed sugars, such as fried and sweet foods.  To prevent constipation: ? Drink enough fluid to keep your urine clear or pale yellow. ? Eat foods that are high in fiber, such as fresh fruits and vegetables, whole grains, and beans. Activity  Exercise only as directed by your health care provider. Most women can continue their usual exercise routine during pregnancy. Try to exercise for 30 minutes at least 5 days a week. Stop exercising if you experience uterine contractions.  Avoid heavy lifting.  Do   not exercise in extreme heat or humidity, or at high altitudes.  Wear low-heel, comfortable shoes.  Practice good posture.  You may continue to have sex unless your health care provider tells you otherwise. Relieving pain and discomfort  Take frequent breaks and rest with your legs elevated if you have leg cramps or low back pain.  Take warm sitz baths to soothe any pain or discomfort caused by hemorrhoids. Use hemorrhoid cream if your health care provider approves.  Wear a good support bra to prevent discomfort from breast tenderness.  If you develop varicose veins: ? Wear support pantyhose or compression stockings as told by your healthcare provider. ? Elevate your feet for 15 minutes, 3-4 times a day. Prenatal care  Write down your questions. Take them to your prenatal visits.  Keep all your prenatal visits as told by your health care provider. This is important. Safety  Wear your seat belt at all times when driving.  Make  a list of emergency phone numbers, including numbers for family, friends, the hospital, and police and fire departments. General instructions  Avoid cat litter boxes and soil used by cats. These carry germs that can cause birth defects in the baby. If you have a cat, ask someone to clean the litter box for you.  Do not travel far distances unless it is absolutely necessary and only with the approval of your health care provider.  Do not use hot tubs, steam rooms, or saunas.  Do not drink alcohol.  Do not use any products that contain nicotine or tobacco, such as cigarettes and e-cigarettes. If you need help quitting, ask your health care provider.  Do not use any medicinal herbs or unprescribed drugs. These chemicals affect the formation and growth of the baby.  Do not douche or use tampons or scented sanitary pads.  Do not cross your legs for long periods of time.  To prepare for the arrival of your baby: ? Take prenatal classes to understand, practice, and ask questions about labor and delivery. ? Make a trial run to the hospital. ? Visit the hospital and tour the maternity area. ? Arrange for maternity or paternity leave through employers. ? Arrange for family and friends to take care of pets while you are in the hospital. ? Purchase a rear-facing car seat and make sure you know how to install it in your car. ? Pack your hospital bag. ? Prepare the baby's nursery. Make sure to remove all pillows and stuffed animals from the baby's crib to prevent suffocation.  Visit your dentist if you have not gone during your pregnancy. Use a soft toothbrush to brush your teeth and be gentle when you floss. Contact a health care provider if:  You are unsure if you are in labor or if your water has broken.  You become dizzy.  You have mild pelvic cramps, pelvic pressure, or nagging pain in your abdominal area.  You have lower back pain.  You have persistent nausea, vomiting, or  diarrhea.  You have an unusual or bad smelling vaginal discharge.  You have pain when you urinate. Get help right away if:  Your water breaks before 37 weeks.  You have regular contractions less than 5 minutes apart before 37 weeks.  You have a fever.  You are leaking fluid from your vagina.  You have spotting or bleeding from your vagina.  You have severe abdominal pain or cramping.  You have rapid weight loss or weight gain.  You have   shortness of breath with chest pain.  You notice sudden or extreme swelling of your face, hands, ankles, feet, or legs.  Your baby makes fewer than 10 movements in 2 hours.  You have severe headaches that do not go away when you take medicine.  You have vision changes. Summary  The third trimester is from week 28 through week 40, months 7 through 9. The third trimester is a time when the unborn baby (fetus) is growing rapidly.  During the third trimester, your discomfort may increase as you and your baby continue to gain weight. You may have abdominal, leg, and back pain, sleeping problems, and an increased need to urinate.  During the third trimester your breasts will keep growing and they will continue to become tender. A yellow fluid (colostrum) may leak from your breasts. This is the first milk you are producing for your baby.  False labor is a condition in which you feel small, irregular tightenings of the muscles in the womb (contractions) that eventually go away. These are called Braxton Hicks contractions. Contractions may last for hours, days, or even weeks before true labor sets in.  Signs of labor can include: abdominal cramps; regular contractions that start at 10 minutes apart and become stronger and more frequent with time; watery or bloody mucus discharge that comes from the vagina; increased pelvic pressure and dull back pain; and leaking of amniotic fluid. This information is not intended to replace advice given to you by your  health care provider. Make sure you discuss any questions you have with your health care provider. Document Revised: 09/28/2018 Document Reviewed: 07/13/2016 Elsevier Patient Education  2020 [employer]  

## 2019-12-19 NOTE — Telephone Encounter (Signed)
Patient states that she would like to do weekly b12 injections. Robin to call and schedule

## 2019-12-19 NOTE — Telephone Encounter (Signed)
-----   Message from Rosey Bath, MD sent at 12/19/2019  6:18 AM EDT ----- Regarding: Please contact Dr Tomasa Blase office in [place]  She had a low B12 (as well as 2 weeks ago).  Does she want her to begin B12 1000 mcg po q day or weekly B12 injections?  M ----- Message ----- From: Leory Plowman, Lab In [place] Sent: 12/18/2019  10:39 AM EDT To: Rosey Bath, MD

## 2019-12-19 NOTE — Telephone Encounter (Signed)
Labs has been routed to the Dr Jerene Pitch office, Per Dr Merlene Pulling.

## 2019-12-20 ENCOUNTER — Telehealth: Payer: Self-pay

## 2019-12-20 ENCOUNTER — Other Ambulatory Visit: Payer: Self-pay | Admitting: Obstetrics and Gynecology

## 2019-12-20 ENCOUNTER — Other Ambulatory Visit: Payer: Self-pay | Admitting: Cardiology

## 2019-12-20 DIAGNOSIS — O24419 Gestational diabetes mellitus in pregnancy, unspecified control: Secondary | ICD-10-CM

## 2019-12-20 DIAGNOSIS — O99213 Obesity complicating pregnancy, third trimester: Secondary | ICD-10-CM

## 2019-12-20 DIAGNOSIS — O99323 Drug use complicating pregnancy, third trimester: Secondary | ICD-10-CM

## 2019-12-20 NOTE — Telephone Encounter (Signed)
Ashley Royalty, CMA calling from Dr. Danton Sewer office wanting to know which route of B12 would be best for pt given she is pregnant.  I adv pt preference but they need it from a doctor.  [phone]

## 2019-12-20 NOTE — Telephone Encounter (Signed)
Shelby Aguilar aware.  Will go with IM.

## 2019-12-20 NOTE — Telephone Encounter (Signed)
Spoke with Dr Jerene Pitch office to see what direction the patient will need with B-12. Dr Tiburcio Pea nurse called back and informed me that the patient can have IM b-12 injection. The patient was ok with having B-12 injection.

## 2019-12-20 NOTE — Telephone Encounter (Signed)
This encounter was created in error - please disregard.

## 2019-12-20 NOTE — Telephone Encounter (Signed)
Called OBGYN to see what they would like to do regarding b-12. Whether or not to do the oral b-12 or b-12 injections. OBGYN Nurse stated that is up to the patient and what she prefers.

## 2019-12-21 ENCOUNTER — Telehealth: Payer: Self-pay | Admitting: Hematology and Oncology

## 2019-12-21 NOTE — Telephone Encounter (Signed)
I called patient to get her set up on B12 inj. I could not leave a message so I sent SMS. I called patient twice.

## 2019-12-24 DIAGNOSIS — E010 Iodine-deficiency related diffuse (endemic) goiter: Secondary | ICD-10-CM | POA: Insufficient documentation

## 2019-12-25 ENCOUNTER — Encounter: Payer: Self-pay | Admitting: Obstetrics & Gynecology

## 2019-12-25 ENCOUNTER — Ambulatory Visit (INDEPENDENT_AMBULATORY_CARE_PROVIDER_SITE_OTHER): Payer: Managed Care, Other (non HMO) | Admitting: Obstetrics & Gynecology

## 2019-12-25 ENCOUNTER — Other Ambulatory Visit: Payer: Self-pay

## 2019-12-25 VITALS — BP 120/70 | Wt 241.0 lb

## 2019-12-25 DIAGNOSIS — O24319 Unspecified pre-existing diabetes mellitus in pregnancy, unspecified trimester: Secondary | ICD-10-CM

## 2019-12-25 DIAGNOSIS — Z3A33 33 weeks gestation of pregnancy: Secondary | ICD-10-CM

## 2019-12-25 DIAGNOSIS — Z98891 History of uterine scar from previous surgery: Secondary | ICD-10-CM

## 2019-12-25 DIAGNOSIS — O0993 Supervision of high risk pregnancy, unspecified, third trimester: Secondary | ICD-10-CM

## 2019-12-25 LAB — FETAL NONSTRESS TEST

## 2019-12-25 NOTE — Progress Notes (Signed)
  Subjective  Fetal Movement? yes Contractions? no Leaking Fluid? no Vaginal Bleeding? no  Objective  BP 120/70   Wt 241 lb (109.3 kg)   LMP 04/23/2019   BMI 42.69 kg/m  General: NAD Pumonary: no increased work of breathing Abdomen: gravid, non-tender Extremities: no edema Psychiatric: mood appropriate, affect full  Assessment  34 y.o. [id number] at [redacted]w[redacted]d by  02/11/2020, by Ultrasound presenting for routine prenatal visit  Plan   Problem List Items Addressed This Visit      Endocrine   Pregestational diabetes mellitus, modified White class B     Other   Supervision of high risk pregnancy, antepartum   History of 2 cesarean sections    Other Visit Diagnoses    [redacted] weeks gestation of pregnancy    -  Primary      pregnancy Problems (from 04/23/19 to present)    Problem Noted Resolved   Severe persistent asthma without complication  No   Overview Addendum 12/15/2019  1:50 AM by Natale Milch, MD    11/14/2019- Advair 250/50 2 puffs BID initiated, pulmonology referral made, advised patient to purchase peak flow meter 12/15/2019- Patient reports her asthma is markedly improved with Advair usage.       Previous Version   BMI 39.0-39.9,adult  No   History of 2 cesarean sections  No   Overview Addendum 11/14/2019  9:32 PM by Natale Milch, MD    Repeat cesarean scheduled [redacted] wk gestation 02/06/2020      Previous Version   Obesity affecting pregnancy, antepartum  No   Overview Signed 07/10/2019  2:47 PM by Nadara Mustard, MD          Supervision of high risk pregnancy, antepartum  No   Overview Addendum 08/15/2019  2:01 PM by Vena Austria, MD    Clinic [facility] Prenatal Labs  Dating 10 week [place] Blood type: A/Positive/-- (01/19 1452)   Genetic Screen  NIPS: XX Antibody:Negative (01/19 1452)  Anatomic [place]  Rubella: 10.70 (01/19 1452) Varicella: Imm  GTT Early: 147 3 hr GTT 103  / 189 / 160 /149 RPR: Non Reactive (01/19 1452)   Rhogam N/A HBsAg: Negative  (01/19 1452)   TDaP vaccine          Flu Shot:03/2019 HIV: Non Reactive (01/19 1452)   Baby Food     Breast                           GBS:   Contraception  Pap:07/10/19  CBB  No   CS/VBAC CS x2, plans CS   Support Person Husband Montegus            Previous Version     Cont current Insulin regimen  Plan MFM appt w [place] Thurs  Weekly NST  CS planned 39 weeks; if poor control of DM continues may plan 37 weeks  Annamarie Major, MD, [facility], [facility] 12/25/2019  4:33 PM

## 2019-12-26 ENCOUNTER — Inpatient Hospital Stay: Payer: Managed Care, Other (non HMO) | Attending: Hematology and Oncology

## 2019-12-26 ENCOUNTER — Telehealth: Payer: Self-pay

## 2019-12-26 ENCOUNTER — Other Ambulatory Visit: Payer: Self-pay

## 2019-12-26 DIAGNOSIS — O99013 Anemia complicating pregnancy, third trimester: Secondary | ICD-10-CM | POA: Diagnosis not present

## 2019-12-26 DIAGNOSIS — E538 Deficiency of other specified B group vitamins: Secondary | ICD-10-CM | POA: Diagnosis not present

## 2019-12-26 DIAGNOSIS — O99283 Endocrine, nutritional and metabolic diseases complicating pregnancy, third trimester: Secondary | ICD-10-CM | POA: Insufficient documentation

## 2019-12-26 MED ORDER — CYANOCOBALAMIN 1000 MCG/ML IJ SOLN
1000.0000 ug | Freq: Once | INTRAMUSCULAR | Status: AC
  Administered 2019-12-26: 1000 ug via INTRAMUSCULAR

## 2019-12-26 MED ORDER — CYANOCOBALAMIN 1000 MCG/ML IJ SOLN
INTRAMUSCULAR | Status: AC
  Filled 2019-12-26: qty 1

## 2019-12-26 NOTE — Telephone Encounter (Signed)
-----   Message from Reggy Eye sent at 12/26/2019  1:08 PM EDT ----- Regarding: RE: B12 She is coming today for B 12 inj ----- Message ----- From: Lesle Chris, RN Sent: 12/26/2019  12:26 PM EDT To: Reggy Eye Subject: FW: B12                                         ----- Message ----- From: Natale Milch, MD Sent: 12/25/2019  11:08 AM EDT To: Lesle Chris, RN Subject: RE: B12                                        I approve the b12 injections.  Thanks,  Dr. Jerene Pitch ----- Message ----- From: Lesle Chris, RN Sent: 12/19/2019  10:10 AM EDT To: Natale Milch, MD Subject: B12                                            Hi Dr Jerene Pitch, This patient was seen by Dr Merlene Pulling [facility] for her anemia. Her vitamin b12 is also low and Dr Merlene Pulling wanted to make sure it was okay to start the patient on b12 injections. We typically do the injections once a week for 4 weeks then once a month. I know she doesn't have that much time before delivery. But we just wanted to get your approval before we started this. Thank you.   Laurie Panda, RN [phone]

## 2019-12-27 ENCOUNTER — Other Ambulatory Visit: Payer: Self-pay | Admitting: Obstetrics and Gynecology

## 2019-12-27 ENCOUNTER — Ambulatory Visit
Admission: RE | Admit: 2019-12-27 | Discharge: 2019-12-27 | Disposition: A | Discharge status: Home / Self Care | Payer: Managed Care, Other (non HMO) | Source: Ambulatory Visit | Attending: Maternal & Fetal Medicine | Admitting: Maternal & Fetal Medicine

## 2019-12-27 DIAGNOSIS — J4550 Severe persistent asthma, uncomplicated: Secondary | ICD-10-CM

## 2019-12-27 DIAGNOSIS — O99323 Drug use complicating pregnancy, third trimester: Secondary | ICD-10-CM

## 2019-12-27 DIAGNOSIS — Z6839 Body mass index (BMI) 39.0-39.9, adult: Secondary | ICD-10-CM

## 2019-12-27 DIAGNOSIS — O99212 Obesity complicating pregnancy, second trimester: Secondary | ICD-10-CM

## 2019-12-27 DIAGNOSIS — O24414 Gestational diabetes mellitus in pregnancy, insulin controlled: Secondary | ICD-10-CM | POA: Diagnosis not present

## 2019-12-27 DIAGNOSIS — O99213 Obesity complicating pregnancy, third trimester: Secondary | ICD-10-CM | POA: Diagnosis not present

## 2019-12-27 DIAGNOSIS — E669 Obesity, unspecified: Secondary | ICD-10-CM | POA: Diagnosis not present

## 2019-12-27 DIAGNOSIS — O24419 Gestational diabetes mellitus in pregnancy, unspecified control: Secondary | ICD-10-CM | POA: Diagnosis not present

## 2019-12-27 DIAGNOSIS — O0990 Supervision of high risk pregnancy, unspecified, unspecified trimester: Secondary | ICD-10-CM

## 2019-12-27 DIAGNOSIS — O99210 Obesity complicating pregnancy, unspecified trimester: Secondary | ICD-10-CM

## 2019-12-27 DIAGNOSIS — Z794 Long term (current) use of insulin: Secondary | ICD-10-CM | POA: Insufficient documentation

## 2019-12-27 DIAGNOSIS — Z3A33 33 weeks gestation of pregnancy: Secondary | ICD-10-CM | POA: Diagnosis not present

## 2019-12-27 DIAGNOSIS — Z98891 History of uterine scar from previous surgery: Secondary | ICD-10-CM

## 2019-12-27 DIAGNOSIS — O24113 Pre-existing diabetes mellitus, type 2, in pregnancy, third trimester: Secondary | ICD-10-CM

## 2020-01-01 ENCOUNTER — Encounter: Payer: Self-pay | Admitting: Obstetrics and Gynecology

## 2020-01-01 ENCOUNTER — Inpatient Hospital Stay: Payer: Managed Care, Other (non HMO)

## 2020-01-01 ENCOUNTER — Ambulatory Visit (INDEPENDENT_AMBULATORY_CARE_PROVIDER_SITE_OTHER): Payer: Managed Care, Other (non HMO) | Admitting: Obstetrics and Gynecology

## 2020-01-01 ENCOUNTER — Encounter: Payer: Self-pay | Admitting: Nurse Practitioner

## 2020-01-01 ENCOUNTER — Other Ambulatory Visit: Payer: Self-pay

## 2020-01-01 ENCOUNTER — Inpatient Hospital Stay (HOSPITAL_BASED_OUTPATIENT_CLINIC_OR_DEPARTMENT_OTHER): Payer: Managed Care, Other (non HMO) | Admitting: Nurse Practitioner

## 2020-01-01 VITALS — BP 100/70 | Ht 63.0 in | Wt 241.6 lb

## 2020-01-01 VITALS — BP 134/69 | HR 105 | Temp 96.0°F | Resp 18 | Wt 242.5 lb

## 2020-01-01 DIAGNOSIS — D509 Iron deficiency anemia, unspecified: Secondary | ICD-10-CM | POA: Diagnosis not present

## 2020-01-01 DIAGNOSIS — E538 Deficiency of other specified B group vitamins: Secondary | ICD-10-CM | POA: Diagnosis not present

## 2020-01-01 DIAGNOSIS — Z3A34 34 weeks gestation of pregnancy: Secondary | ICD-10-CM | POA: Diagnosis not present

## 2020-01-01 DIAGNOSIS — O99013 Anemia complicating pregnancy, third trimester: Secondary | ICD-10-CM | POA: Diagnosis not present

## 2020-01-01 DIAGNOSIS — D649 Anemia, unspecified: Secondary | ICD-10-CM

## 2020-01-01 LAB — CBC WITH DIFFERENTIAL/PLATELET
Abs Immature Granulocytes: 0.07 10*3/uL (ref 0.00–0.07)
Basophils Absolute: 0 10*3/uL (ref 0.0–0.1)
Basophils Relative: 0 %
Eosinophils Absolute: 0.3 10*3/uL (ref 0.0–0.5)
Eosinophils Relative: 2 %
HCT: 33.5 % — ABNORMAL LOW (ref 36.0–46.0)
Hemoglobin: 11 g/dL — ABNORMAL LOW (ref 12.0–15.0)
Immature Granulocytes: 1 %
Lymphocytes Relative: 15 %
Lymphs Abs: 2 10*3/uL (ref 0.7–4.0)
MCH: 25.1 pg — ABNORMAL LOW (ref 26.0–34.0)
MCHC: 32.8 g/dL (ref 30.0–36.0)
MCV: 76.5 fL — ABNORMAL LOW (ref 80.0–100.0)
Monocytes Absolute: 0.6 10*3/uL (ref 0.1–1.0)
Monocytes Relative: 4 %
Neutro Abs: 10.6 10*3/uL — ABNORMAL HIGH (ref 1.7–7.7)
Neutrophils Relative %: 78 %
Platelets: 235 10*3/uL (ref 150–400)
RBC: 4.38 MIL/uL (ref 3.87–5.11)
RDW: 14.8 % (ref 11.5–15.5)
WBC: 13.5 10*3/uL — ABNORMAL HIGH (ref 4.0–10.5)
nRBC: 0 % (ref 0.0–0.2)

## 2020-01-01 LAB — POCT URINALYSIS DIPSTICK OB: Glucose, UA: NEGATIVE

## 2020-01-01 MED ORDER — CYANOCOBALAMIN 1000 MCG/ML IJ SOLN
1000.0000 ug | Freq: Once | INTRAMUSCULAR | Status: AC
  Administered 2020-01-01: 1000 ug via INTRAMUSCULAR

## 2020-01-01 NOTE — Patient Instructions (Signed)

## 2020-01-01 NOTE — Progress Notes (Signed)
Routine Prenatal Care Visit  Subjective  Shelby Aguilar is a 34 y.o. G3P2002 at [redacted]w[redacted]d being seen today for ongoing prenatal care.  She is currently monitored for the following issues for this high-risk pregnancy and has GOITER, UNSPECIFIED; PCOD (polycystic ovarian disease); Obesity affecting pregnancy, antepartum; Supervision of high risk pregnancy, antepartum; History of 2 cesarean sections; BMI 39.0-39.9,adult; Pregestational diabetes mellitus, modified White class B; Amenorrhea; Severe persistent asthma without complication; Normocytic anemia; B12 deficiency; and Thyromegaly on their problem list.  ----------------------------------------------------------------------------------- Patient reports no complaints.   Contractions: Irregular. Vag. Bleeding: None.  Movement: Present. Denies leaking of fluid.  ----------------------------------------------------------------------------------- The following portions of the patient's history were reviewed and updated as appropriate: allergies, current medications, past family history, past medical history, past social history, past surgical history and problem list. Problem list updated.   Objective  Blood pressure 100/70, height 5\' 3"  (1.6 m), weight 241 lb 9.6 oz (109.6 kg), last menstrual period 04/23/2019, currently breastfeeding. Pregravid weight 220 lb (99.8 kg) Total Weight Gain 21 lb 9.6 oz (9.798 kg) Urinalysis:      Fetal Status: Fetal Heart Rate (bpm): 150   Movement: Present     General:  Alert, oriented and cooperative. Patient is in no acute distress.  Skin: Skin is warm and dry. No rash noted.   Cardiovascular: Normal heart rate noted  Respiratory: Normal respiratory effort, no problems with respiration noted  Abdomen: Soft, gravid, appropriate for gestational age. Pain/Pressure: Absent     Pelvic:  Cervical exam deferred        Extremities: Normal range of motion.  Edema: None  Mental Status: Normal mood and affect.  Normal behavior. Normal judgment and thought content.     Assessment   34 y.o. 34 at [redacted]w[redacted]d by  02/11/2020, by Ultrasound presenting for routine prenatal visit  Plan   pregnancy Problems (from 04/23/19 to present)    Problem Noted Resolved   Severe persistent asthma without complication 11/14/2019 by 11/16/2019, MD No   Overview Addendum 12/15/2019  1:50 AM by 12/17/2019, MD    11/14/2019- Advair 250/50 2 puffs BID initiated, pulmonology referral made, advised patient to purchase peak flow meter 12/15/2019- Patient reports her asthma is markedly improved with Advair usage.       Previous Version   BMI 39.0-39.9,adult 07/19/2019 by 07/21/2019, MD No   History of 2 cesarean sections 07/10/2019 by 07/12/2019, MD No   Overview Addendum 11/14/2019  9:32 PM by 11/16/2019, MD    Repeat cesarean scheduled [redacted] wk gestation 02/06/2020      Previous Version   Obesity affecting pregnancy, antepartum 11/19/2017 by 01/19/2018, CNM No   Overview Signed 07/10/2019  2:47 PM by 07/12/2019, MD    BMI >=40 [ ]  early 1h gtt -  [ ]  u/s for dating [ ]   [ ]  nutritional goals [ ]  folic acid 1mg  [ ]  bASA (>12 weeks) [ ]  consider nutrition consult [ ]  consider maternal EKG 1st trimester [ ]  Growth u/s 28 [ ] , 32 [ ] , 36 weeks [ ]  [ ]  NST/AFI weekly 36+ weeks (36[] , 37[] , 38[] )       Supervision of high risk pregnancy, antepartum 11/19/2017 by , CNM No   Overview Addendum 08/15/2019  2:01 PM by , MD    [facility]inic [facility] Prenatal Labs  Dating 10 week Blood type: A/Positive/-- (01/19 1452)   Genetic Screen  NIPS: XX Antibody:Negative (  01/19 1452)  Anatomic [place]  Rubella: 10.70 (01/19 1452) Varicella: Imm  GTT Early: 147 3 hr GTT 103  / 189 / 160 /149 RPR: Non Reactive (01/19 1452)   Rhogam N/A HBsAg: Negative (01/19 1452)   TDaP vaccine          Flu Shot:03/2019 HIV: Non Reactive (01/19 1452)   Baby Food     Breast                            GBS:   Contraception  Pap:07/10/19  CBB  No   CS/VBAC CS x2, plans CS   Support Person Husband Montegus            Previous Version      nst reactive NST: 150 bpm baseline, moderate variability, 15x15 accelerations, no decelerations. Continue current insulin regimen  Gestational age appropriate obstetric precautions including but not limited to vaginal bleeding, contractions, leaking of fluid and fetal movement were reviewed in detail with the patient.    Return in about 1 week (around 01/08/2020) for ROB/NST with MD in person.  Natale Milch MD [facility], [facility] 01/01/2020, 12:05 PM

## 2020-01-01 NOTE — Progress Notes (Signed)
[facility]  [address] [place], [place] [address] Phone: [phone]  Fax: [phone]   Clinic Day:  01/01/20  Referring physician: [facility] */Dr. Adelene Idler  Chief Complaint: Shelby Aguilar is a 34 y.o. female  with anemia during the third trimester of pregnancy who returns to clinic for 2 week follow up for iron deficiency and b12 anemia.    HPI: Patient was last seen by Dr. Merlene Pulling on 12/18/2019.  At that time, she was found to have normocytic anemia likely secondary to iron deficiency and B12 deficiency.  She was started on oral iron and told to take with vitamin C supplement.  She also started B12 injections for B12 deficiency.  Since that time, she says that she feels somewhat better but continues to have fatigue and shortness of breath with exertion.  She is unsure if this is related to anemia or advancing pregnancy.  She has been taking iron tablets once daily and tolerating well without significant side effects.  Hesitant to take with orange juice due to diabetes.  Continues to deny pica.  No restless leg.  Has intermittent headaches.  No weakness, dry skin, alopecia, mouth sores.  No abnormal bleeding.   Past Medical History:  Diagnosis Date  . Asthma   . Depression   . Family history of breast cancer    1/21 cancer genetic tesitng letter sent  . Gestational diabetes   . PCOS (polycystic ovarian syndrome)     Past Surgical History:  Procedure Laterality Date  . CESAREAN SECTION  2013  . CESAREAN SECTION N/A 07/25/2018   Procedure: CESAREAN SECTION;  Surgeon: Natale Milch, MD;  Location: [facility] ORS;  Service: Obstetrics;  Laterality: N/A;    Family History  Problem Relation Age of Onset  . Breast cancer Paternal Grandmother 11  . Diabetes Father   . Diabetes Sister     Social History:  reports that she has never smoked. She has never used smokeless tobacco. She reports previous alcohol use. She  reports that she does not use drugs.She denies tobacco use. Outside of pregnancy, she drinks alcohol socially. 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 She has an 32 year old and a 67 month old, [name]. The patient is alone today.  Allergies: No Known Allergies  Current Medications: Current Outpatient Medications  Medication Sig Dispense Refill  . albuterol (VENTOLIN HFA) 108 (90 Base) MCG/ACT inhaler Inhale 2 puffs into the lungs every 4 (four) hours as needed for wheezing or shortness of breath. 6.7 g 2  . Fluticasone-Salmeterol (ADVAIR DISKUS) 250-50 MCG/DOSE AEPB Inhale 2 puffs into the lungs in the morning and at bedtime. 60 each 6  . glucose blood test strip Use as instructed 125 each 5  . insulin lispro (HUMALOG KWIKPEN) 100 UNIT/ML KwikPen Inject 0.1 mLs (10 Units total) into the skin 3 (three) times daily before meals. 15 mL 11  . Insulin NPH, Human,, Isophane, (HUMULIN N) 100 UNIT/ML Kiwkpen 30 units in the morning, 50 units at night 30 mL 11  . Insulin Pen Needle (BD PEN NEEDLE MICRO U/F) 32G X 6 MM MISC USE AS DIRECTED TO INJECT INSULIN 100 each 11  . Lancets (ONETOUCH ULTRASOFT) lancets Use as instructed 200 each 5  . Prenatal Vit-Fe Fumarate-FA (MULTIVITAMIN-PRENATAL) 27-0.8 MG TABS tablet Take 1 tablet by mouth daily.     . sertraline (ZOLOFT) 50 MG tablet TAKE 1 TABLET BY MOUTH EVERY  DAY 90 tablet 1   No current facility-administered medications for this visit.    Review of Systems  Constitutional: Positive for malaise/fatigue. Negative for chills, fever and weight loss.  HENT: Negative for hearing loss, nosebleeds, sore throat and tinnitus.   Eyes: Negative for blurred vision and double vision.  Respiratory: Positive for shortness of breath (with exertion; not worse). Negative for cough, hemoptysis and wheezing.   Cardiovascular: Negative for chest pain, palpitations and leg swelling.  Gastrointestinal: Negative for abdominal pain, blood in  stool, constipation, diarrhea, melena, nausea and vomiting.  Genitourinary: Negative for dysuria and urgency.  Musculoskeletal: Negative for back pain, falls, joint pain and myalgias.  Skin: Negative for itching and rash.  Neurological: Negative for dizziness, tingling, sensory change, loss of consciousness, weakness and headaches.  Endo/Heme/Allergies: Negative for environmental allergies. Does not bruise/bleed easily.  Psychiatric/Behavioral: Negative for depression. The patient is not nervous/anxious and does not have insomnia.    Performance status (ECOG): 0-1  Vitals Blood pressure 134/69, pulse (!) 105, temperature (!) 96 F (35.6 C), temperature source Tympanic, resp. rate 18, weight 242 lb 8.1 oz (110 kg), last menstrual period 04/23/2019, SpO2 99 %, currently breastfeeding.   Physical Exam Vitals and nursing note reviewed.  Constitutional:      General: She is not in acute distress.    Appearance: Normal appearance. She is not diaphoretic.  HENT:     Head: Normocephalic and atraumatic.  Eyes:     General: No scleral icterus.    Conjunctiva/sclera: Conjunctivae normal.     Comments: Brown eyes.  Cardiovascular:     Rate and Rhythm: Normal rate and regular rhythm.     Heart sounds: Normal heart sounds. No murmur heard.   Pulmonary:     Effort: Pulmonary effort is normal. No respiratory distress.     Breath sounds: Wheezes: soft inspiratory.  Abdominal:     Palpations: Abdomen is soft.     Tenderness: There is no guarding.     Comments: gravid  Musculoskeletal:        General: No swelling or tenderness. Normal range of motion.  Skin:    General: Skin is dry.     Coloration: Skin is not pale.  Neurological:     Mental Status: She is alert and oriented to person, place, and time. Mental status is at baseline.  Psychiatric:        Mood and Affect: Mood normal.        Behavior: Behavior normal.        Thought Content: Thought content normal.        Judgment: Judgment  normal.    Appointment on 01/01/2020  Component Date Value Ref Range Status  . WBC 01/01/2020 13.5* 4.0 - 10.5 K/uL Final  . RBC 01/01/2020 4.38  3.87 - 5.11 MIL/uL Final  . Hemoglobin 01/01/2020 11.0* 12.0 - 15.0 g/dL Final  . HCT 27/11/2374 33.5* 36 - 46 % Final  . MCV 01/01/2020 76.5* 80.0 - 100.0 fL Final  . MCH 01/01/2020 25.1* 26.0 - 34.0 pg Final  . MCHC 01/01/2020 32.8  30.0 - 36.0 g/dL Final  . RDW 28/31/5176 14.8  11.5 - 15.5 % Final  . Platelets 01/01/2020 235  150 - 400 K/uL Final  . nRBC 01/01/2020 0.0  0.0 - 0.2 % Final  . Neutrophils Relative % 01/01/2020 78  % Final  . Neutro Abs 01/01/2020 10.6* 1.7 - 7.7 K/uL Final  . Lymphocytes Relative 01/01/2020 15  % Final  .  Lymphs Abs 01/01/2020 2.0  0.7 - 4.0 K/uL Final  . Monocytes Relative 01/01/2020 4  % Final  . Monocytes Absolute 01/01/2020 0.6  0 - 1 K/uL Final  . Eosinophils Relative 01/01/2020 2  % Final  . Eosinophils Absolute 01/01/2020 0.3  0 - 0 K/uL Final  . Basophils Relative 01/01/2020 0  % Final  . Basophils Absolute 01/01/2020 0.0  0 - 0 K/uL Final  . Immature Granulocytes 01/01/2020 1  % Final  . Abs Immature Granulocytes 01/01/2020 0.07  0.00 - 0.07 K/uL Final   Performed at [facility], [address], [place], [place] [address]  Routine Prenatal on 01/01/2020  Component Date Value Ref Range Status  . Glucose, UA 01/01/2020 Negative  Negative Final  . POC,PROTEIN,UA 01/01/2020 Trace  Negative, Trace, Small (1+), Moderate (2+), Large (3+), 4+ Final    Assessment:  Shelby Aguilar is a 34 y.o. female currently pregnant with a normocytic anemia.  She is scheduled for a C section on 02/06/2020.  CBC on 11/14/2019 inlcuded a hematocrit 33.1, hemoglobin 10.7, MCV 80, and WBC 12,700 (ANC 10,100).  Ferritin was 16 with an iron saturation of 9% and a TIBC of 400 on 11/29/2009.  Hemoglobin electrophoresis was normal on 07/10/2019.  She has a history of B12 deficiency.  She was previously on  B12 injections.  B12 was 159 on 11/30/2019.  Medical problems include goiter, polycystic ovary disease, obesity, pregestational diabetes mellitus and severe persistent asthma.     Symptomatically, she feels tired but overall stable.  Currently 34 weeks 1 day, G3P2002.   Plan: 1.   Labs today were reviewed and discussed with patient in detail: CBC with differential, iron and TIBC, reticulocytes 2.   Normocytic anemia  -Likely secondary to iron deficiency and B12 deficiency   -Hemoglobin slightly improved today at 11.0, hematocrit 33.5  -Awaiting iron studies  -Tolerating oral iron well without significant side effects.  Has missed some doses.  Encouraged  compliance today including pillboxes.  Okay to increase to twice daily.    -No preauthorization required for Venofer.  -We will plan to recheck labs again next week.  If unimproved or worse, plan for Venofer.  If  improved, continue oral iron twice daily. 3.   History of B12 deficiency  - B12 deficiency.   - Injections ok'd by Dr. Jerene Pitch  - s/p 1000 mcg injection on 12/26/2019 with plan for weekly injections x 4 then monthly 4.   Thyromegaly  - TSH 0.762  - FT4 0.72  - within normal limits  - normal thyroid u/s on 09/2007. No recent imaging to confirm goiter. Not currently on thyroid medication.   Disposition:  b12 today. RTC in one week for labs, MD evaluation, b12 and possible venofer.   I discussed the assessment and treatment plan with the patient.  The patient was provided an opportunity to ask questions and all were answered.  The patient agreed with the plan and demonstrated an understanding of the instructions.  The patient was advised to call back if the symptoms worsen or if the condition fails to improve as anticipated.  Consuello Masse, DNP, AGNP-[facility] [phone] (clinic)

## 2020-01-02 ENCOUNTER — Encounter: Payer: Self-pay | Admitting: Pulmonary Disease

## 2020-01-02 ENCOUNTER — Ambulatory Visit: Payer: Managed Care, Other (non HMO) | Admitting: Pulmonary Disease

## 2020-01-02 ENCOUNTER — Institutional Professional Consult (permissible substitution): Payer: Managed Care, Other (non HMO) | Admitting: Pulmonary Disease

## 2020-01-02 VITALS — BP 134/78 | HR 87 | Temp 97.3°F | Ht 63.0 in | Wt 243.0 lb

## 2020-01-02 DIAGNOSIS — K219 Gastro-esophageal reflux disease without esophagitis: Secondary | ICD-10-CM

## 2020-01-02 DIAGNOSIS — Z3A34 34 weeks gestation of pregnancy: Secondary | ICD-10-CM | POA: Diagnosis not present

## 2020-01-02 DIAGNOSIS — J4550 Severe persistent asthma, uncomplicated: Secondary | ICD-10-CM | POA: Diagnosis not present

## 2020-01-02 LAB — RETICULOCYTES
Immature Retic Fract: 30.8 % — ABNORMAL HIGH (ref 2.3–15.9)
RBC.: 4.29 MIL/uL (ref 3.87–5.11)
Retic Count, Absolute: 78.5 10*3/uL (ref 19.0–186.0)
Retic Ct Pct: 1.8 % (ref 0.4–3.1)

## 2020-01-02 LAB — IRON AND TIBC
Iron: 48 ug/dL (ref 28–170)
Saturation Ratios: 10 % — ABNORMAL LOW (ref 10.4–31.8)
TIBC: 487 ug/dL — ABNORMAL HIGH (ref 250–450)
UIBC: 439 ug/dL

## 2020-01-02 MED ORDER — MONTELUKAST SODIUM 10 MG PO TABS: 10.0000 mg | ORAL_TABLET | Freq: Every day | ORAL | 2 refills | Status: AC

## 2020-01-02 NOTE — Patient Instructions (Signed)
Continue Advair for now, make sure you rinse your mouth well after you use it use a little bit of baking soda (sodium bicarbonate) in the rinse water to make sure your mouth is clear and you do not develop any thrush.  I am adding Singulair 1 tablet daily.  You may take Pepcid 1 tablet at bedtime if you have issues with reflux.  We will see you in follow-up on 2 August.

## 2020-01-02 NOTE — Progress Notes (Signed)
Subjective:    Patient ID: Shelby Aguilar, female    DOB: 04-04-1986, 34 y.o.   MRN: [id number]  HPI Patient is a very pleasant 34 year old lifelong never smoker, who sent for evaluation and management of asthma.  She is kindly referred by Dr. Stefanie Libel. Nechama Guard of [facility].  The patient states that she has had asthma since her high school years.  The most part she has been fairly well controlled.  Patient has noted that on prior pregnancies of asthma did tend to get worse.  This is not unusual as certain women do have this issue with asthma control.  She notes that she has difficulty sleeping at night due to breakthrough symptoms.  She has also noted heart palpitations intermittently during the day.  She states that she has had these of long-term but have become worse during her pregnancy.  She does have issues with anemia.  She notes that any activity and heat exacerbate her symptoms.  Mostly this has happened during her pregnancy.  She was started on Advair by OB/GYN, has noted that her symptoms are better controlled.  She has been scheduled for C-section 10 August.  She required C-section on a prior pregnancy.  Patient does note also significant issues with gastroesophageal reflux which also aggravate her symptoms.  He has not had any fevers, chills or sweats.  Does note orthopnea but mostly due to pregnancy.  He has not had occasional dry cough but no sputum production, no hemoptysis.  No chest pain.  Patient notes that a sleep study has been ordered however given her current status would recommend that this is done after her pregnancy.  Review of Systems A 10 point review of systems was performed and it is as noted above otherwise negative.  Past Medical History:  Diagnosis Date  . Asthma   . B12 deficiency   . Depression   . Family history of breast cancer    1/21 cancer genetic tesitng letter sent  . Gestational diabetes   . Iron deficiency anemia   . PCOS (polycystic  ovarian syndrome)    Past Surgical History:  Procedure Laterality Date  . CESAREAN SECTION  2013  . CESAREAN SECTION N/A 07/25/2018   Procedure: CESAREAN SECTION;  Surgeon: Homero Fellers, MD;  Location: [facility] ORS;  Service: Obstetrics;  Laterality: N/A;   Family History  Problem Relation Age of Onset  . Breast cancer Paternal Grandmother 103  . Diabetes Father   . Diabetes Sister    Social History   Tobacco Use  . Smoking status: Never Smoker  . Smokeless tobacco: Never Used  Substance Use Topics  . Alcohol use: Not Currently   No Known Allergies  Immunization History  Administered Date(s) Administered  . DTP 02/18/1988, 04/12/1988, 06/16/1988, 04/21/1989, 01/09/1992  . HPV Quadrivalent 05/03/2007, 07/19/2007  . Hepatitis A 05/03/2007  . Hepatitis A, Adult 05/03/2007  . Hepatitis A, Ped/Adol-2 Dose 11/23/2005  . Hepatitis B 03/19/1999, 04/23/1999, 09/17/1999  . HiB (PRP-OMP) 08/04/1989  . Hpv 05/03/2007, 07/19/2007  . Influenza, Seasonal, Injecte, Preservative Fre 04/12/2013  . Influenza,inj,Quad PF,6+ Mos 07/05/2012, 02/27/2018  . Influenza-Unspecified 03/05/2019  . MMR 04/21/1989, 09/29/1992  . Meningococcal Conjugate 09/29/2005  . OPV 02/18/1988, 04/12/1988, 04/22/1989, 01/09/1992  . Tdap 03/10/2005, 05/31/2018, 12/14/2019  . Typhoid Inactivated 05/25/2007  . Yellow Fever 05/25/2007      Objective:   Physical Exam BP 134/78 (BP Location: Left Arm, Cuff Size: Normal)   Pulse 87   Temp (!) 97.3  F (36.3 C) (Temporal)   Ht '5\' 3"'  (1.6 m)   Wt 243 lb (110.2 kg)   LMP 04/23/2019   SpO2 98%   BMI 43.05 kg/m  [redacted] weeks gestation BP 134/78 (BP Location: Left Arm, Cuff Size: Normal)   Pulse 87   Temp (!) 97.3 F (36.3 C) (Temporal)   Ht '5\' 3"'  (1.6 m)   Wt 243 lb (110.2 kg)   LMP 04/23/2019   SpO2 98%   BMI 43.05 kg/m   GENERAL: Well developed, obese woman in no acute respiratory distress. HEAD: Normocephalic, atraumatic.  EYES: Pupils equal, round,  reactive to light.  No scleral icterus.  MOUTH: Nose/mouth/throat not examined due to masking requirements for COVID 19. NECK: Supple. No thyromegaly. Trachea midline. No JVD.  No adenopathy. PULMONARY:  Good air entry bilaterally.  Faint end expiratory wheeze on the left upper lung zone otherwise, no other adventitious sounds. CARDIOVASCULAR: S1 and S2. Regular rate and rhythm.  Grade 1/6 systolic ejection murmur left sternal border, rubs or gallops. GASTROINTESTINAL: Gravid abdomen  MUSCULOSKELETAL: No joint deformity, no clubbing, no edema.  NEUROLOGIC: No focal deficits noted.  Gait is normal.  Speech is fluent. SKIN: Intact,warm,dry.  Limited exam shows no rashes. PSYCH: Mood and behavior normal.  Results for orders placed or performed in visit on 01/01/20 (from the past 48 hour(s))  Reticulocytes     Status: Abnormal   Collection Time: 01/01/20  1:22 PM  Result Value Ref Range   Retic Ct Pct 1.8 0.4 - 3.1 %   RBC. 4.29 3.87 - 5.11 MIL/uL   Retic Count, Absolute 78.5 19.0 - 186.0 K/uL   Immature Retic Fract 30.8 (H) 2.3 - 15.9 %    Comment: Performed at [facility], [address] [place], [place] [address]  Iron and TIBC     Status: Abnormal   Collection Time: 01/01/20  1:22 PM  Result Value Ref Range   Iron 48 28 - 170 ug/dL   TIBC 487 (H) 250 - 450 ug/dL   Saturation Ratios 10 (L) 10.4 - 31.8 %   UIBC 439 ug/dL    Comment: Performed at [facility], [address] [place], [place] [address]  CBC with Differential     Status: Abnormal   Collection Time: 01/01/20  1:22 PM  Result Value Ref Range   WBC 13.5 (H) 4.0 - 10.5 K/uL   RBC 4.38 3.87 - 5.11 MIL/uL   Hemoglobin 11.0 (L) 12.0 - 15.0 g/dL   HCT 33.5 (L) 36 - 46 %   MCV 76.5 (L) 80.0 - 100.0 fL   MCH 25.1 (L) 26.0 - 34.0 pg   MCHC 32.8 30.0 - 36.0 g/dL   RDW 14.8 11.5 - 15.5 %   Platelets 235 150 - 400 K/uL   nRBC 0.0 0.0 - 0.2 %   Neutrophils Relative % 78 %   Neutro Abs 10.6 (H) 1.7 - 7.7  K/uL   Lymphocytes Relative 15 %   Lymphs Abs 2.0 0.7 - 4.0 K/uL   Monocytes Relative 4 %   Monocytes Absolute 0.6 0 - 1 K/uL   Eosinophils Relative 2 %   Eosinophils Absolute 0.3 0 - 0 K/uL   Basophils Relative 0 %   Basophils Absolute 0.0 0 - 0 K/uL   Immature Granulocytes 1 %   Abs Immature Granulocytes 0.07 0.00 - 0.07 K/uL    Comment: Performed at [facility], [address] [place], [place] [address]  Assessment & Plan:     ICD-10-CM   1. Severe persistent asthma without complication  H60.16    Currently on Advair 250/50 Ideally should be on Symbicort however covered by her insurance We will continue Advair for now Add Singulair  2. Gastroesophageal reflux disease, unspecified whether esophagitis present  K21.9    Antireflux measures May be difficult to control due to pregnancy This adds complexity to her management May take Pepcid as needed  3. [redacted] weeks gestation of pregnancy  Z3A.34    This adds complexity to her management    Meds ordered this encounter  Medications  . montelukast (SINGULAIR) 10 MG tablet    Sig: Take 1 tablet (10 mg total) by mouth daily.    Dispense:  30 tablet    Refill:  2   Discussion:  Patient has severe persistent asthma and has been complicated by pregnancy.  Ideally she should be on Symbicort as budesonide (steroid and Symbicort) is safer on fetus.  However, the patient is close to delivery so hopefully this should not be much of an issue at this point.  We will add Singulair given that she still has some minimal wheezing noted on today's exam.  She has issues with gastroesophageal reflux which will aggravate her asthma.  I have recommended antireflux measures and to take Pepcid as needed for reflux symptoms.  We will see the patient in follow-up on 2 August prior to her planned C-section to ensure that she is well compensated.  Pulmonary/CCM will be available as needed once patient is admitted for C-section.   Renold Don, MD [facility]   *This note was dictated using voice recognition software/Dragon.  Despite best efforts to proofread, errors can occur which can change the meaning.  Any change was purely unintentional.

## 2020-01-03 ENCOUNTER — Ambulatory Visit
Admission: RE | Admit: 2020-01-03 | Discharge: 2020-01-03 | Disposition: A | Discharge status: Home / Self Care | Payer: Managed Care, Other (non HMO) | Source: Ambulatory Visit | Attending: Maternal & Fetal Medicine | Admitting: Maternal & Fetal Medicine

## 2020-01-03 ENCOUNTER — Encounter: Payer: Self-pay | Admitting: Pulmonary Disease

## 2020-01-03 ENCOUNTER — Ambulatory Visit: Payer: Managed Care, Other (non HMO)

## 2020-01-03 ENCOUNTER — Other Ambulatory Visit: Payer: Self-pay

## 2020-01-03 DIAGNOSIS — O99210 Obesity complicating pregnancy, unspecified trimester: Secondary | ICD-10-CM

## 2020-01-03 DIAGNOSIS — O99213 Obesity complicating pregnancy, third trimester: Secondary | ICD-10-CM | POA: Diagnosis not present

## 2020-01-03 DIAGNOSIS — Z98891 History of uterine scar from previous surgery: Secondary | ICD-10-CM

## 2020-01-03 DIAGNOSIS — Z794 Long term (current) use of insulin: Secondary | ICD-10-CM | POA: Insufficient documentation

## 2020-01-03 DIAGNOSIS — E669 Obesity, unspecified: Secondary | ICD-10-CM | POA: Diagnosis not present

## 2020-01-03 DIAGNOSIS — O24113 Pre-existing diabetes mellitus, type 2, in pregnancy, third trimester: Secondary | ICD-10-CM

## 2020-01-03 DIAGNOSIS — O0990 Supervision of high risk pregnancy, unspecified, unspecified trimester: Secondary | ICD-10-CM

## 2020-01-03 DIAGNOSIS — Z3A34 34 weeks gestation of pregnancy: Secondary | ICD-10-CM | POA: Insufficient documentation

## 2020-01-03 DIAGNOSIS — Z6839 Body mass index (BMI) 39.0-39.9, adult: Secondary | ICD-10-CM

## 2020-01-03 DIAGNOSIS — O99212 Obesity complicating pregnancy, second trimester: Secondary | ICD-10-CM

## 2020-01-03 DIAGNOSIS — J4550 Severe persistent asthma, uncomplicated: Secondary | ICD-10-CM

## 2020-01-07 ENCOUNTER — Observation Stay
Admission: EM | Admit: 2020-01-07 | Discharge: 2020-01-07 | Disposition: A | Discharge status: Home / Self Care | Payer: Managed Care, Other (non HMO) | Attending: Obstetrics and Gynecology | Admitting: Obstetrics and Gynecology

## 2020-01-07 ENCOUNTER — Encounter: Payer: Self-pay | Admitting: Advanced Practice Midwife

## 2020-01-07 ENCOUNTER — Other Ambulatory Visit: Payer: Self-pay

## 2020-01-07 DIAGNOSIS — M5430 Sciatica, unspecified side: Secondary | ICD-10-CM | POA: Diagnosis not present

## 2020-01-07 DIAGNOSIS — O26893 Other specified pregnancy related conditions, third trimester: Secondary | ICD-10-CM | POA: Diagnosis not present

## 2020-01-07 DIAGNOSIS — O24414 Gestational diabetes mellitus in pregnancy, insulin controlled: Secondary | ICD-10-CM | POA: Insufficient documentation

## 2020-01-07 DIAGNOSIS — O99343 Other mental disorders complicating pregnancy, third trimester: Secondary | ICD-10-CM | POA: Insufficient documentation

## 2020-01-07 DIAGNOSIS — R102 Pelvic and perineal pain: Secondary | ICD-10-CM | POA: Diagnosis not present

## 2020-01-07 DIAGNOSIS — Z6839 Body mass index (BMI) 39.0-39.9, adult: Secondary | ICD-10-CM

## 2020-01-07 DIAGNOSIS — O99513 Diseases of the respiratory system complicating pregnancy, third trimester: Secondary | ICD-10-CM | POA: Insufficient documentation

## 2020-01-07 DIAGNOSIS — J4550 Severe persistent asthma, uncomplicated: Secondary | ICD-10-CM

## 2020-01-07 DIAGNOSIS — Z7951 Long term (current) use of inhaled steroids: Secondary | ICD-10-CM | POA: Diagnosis not present

## 2020-01-07 DIAGNOSIS — Z794 Long term (current) use of insulin: Secondary | ICD-10-CM | POA: Diagnosis not present

## 2020-01-07 DIAGNOSIS — O99283 Endocrine, nutritional and metabolic diseases complicating pregnancy, third trimester: Secondary | ICD-10-CM | POA: Insufficient documentation

## 2020-01-07 DIAGNOSIS — J45909 Unspecified asthma, uncomplicated: Secondary | ICD-10-CM | POA: Diagnosis not present

## 2020-01-07 DIAGNOSIS — F329 Major depressive disorder, single episode, unspecified: Secondary | ICD-10-CM | POA: Diagnosis not present

## 2020-01-07 DIAGNOSIS — E282 Polycystic ovarian syndrome: Secondary | ICD-10-CM | POA: Insufficient documentation

## 2020-01-07 DIAGNOSIS — O99210 Obesity complicating pregnancy, unspecified trimester: Secondary | ICD-10-CM

## 2020-01-07 DIAGNOSIS — Z79899 Other long term (current) drug therapy: Secondary | ICD-10-CM | POA: Insufficient documentation

## 2020-01-07 DIAGNOSIS — Z98891 History of uterine scar from previous surgery: Secondary | ICD-10-CM

## 2020-01-07 DIAGNOSIS — Z3A35 35 weeks gestation of pregnancy: Secondary | ICD-10-CM | POA: Diagnosis not present

## 2020-01-07 DIAGNOSIS — O0990 Supervision of high risk pregnancy, unspecified, unspecified trimester: Secondary | ICD-10-CM

## 2020-01-07 NOTE — Telephone Encounter (Signed)
Ask her to go to the hospital- she can be evaluated on L&D

## 2020-01-07 NOTE — Discharge Summary (Signed)
Physician Final Progress Note  Patient ID: Shelby Aguilar MRN: [id number] DOB/AGE: 1985-09-28 34 y.o.  Admit date: 01/07/2020 Admitting provider: Tresea Mall, CNM Discharge date: 01/07/2020   Admission Diagnoses: pelvic pain and pressure, left leg numbness  Discharge Diagnoses:  Active Problems:   Labor and delivery, indication for care   [redacted] weeks gestation of pregnancy Reactive NST Pubic Symphysis Dysfunction Sciatica of pregnancy  History of Present Illness: The patient is a 34 y.o. female G3P2002 at [redacted]w[redacted]d who presents for low pelvic pain and pressure that has been getting worse. She reports pain with shifting positions in bed and lifting her legs. The pain is constant. She also has new numbness in her left lower extremity. She denies any red, warm, painful areas in her leg. She denies back pain or pain shooting down her leg. She has occasional contractions. She denies vaginal bleeding or leakage of fluid. She reports good fetal movement. We discussed comfort measures.    Past Medical History:  Diagnosis Date  . Asthma   . B12 deficiency   . Depression   . Family history of breast cancer    1/21 cancer genetic tesitng letter sent  . Gestational diabetes   . Iron deficiency anemia   . PCOS (polycystic ovarian syndrome)     Past Surgical History:  Procedure Laterality Date  . CESAREAN SECTION  2013  . CESAREAN SECTION N/A 07/25/2018   Procedure: CESAREAN SECTION;  Surgeon: Natale Milch, MD;  Location: [facility] ORS;  Service: Obstetrics;  Laterality: N/A;    No current facility-administered medications on file prior to encounter.   Current Outpatient Medications on File Prior to Encounter  Medication Sig Dispense Refill  . ferrous sulfate 325 (65 FE) MG tablet Take 325 mg by mouth daily with breakfast.    . Fluticasone-Salmeterol (ADVAIR DISKUS) 250-50 MCG/DOSE AEPB Inhale 2 puffs into the lungs in the morning and at bedtime. 60 each 6  . glucose blood test strip  Use as instructed 125 each 5  . insulin lispro (HUMALOG KWIKPEN) 100 UNIT/ML KwikPen Inject 0.1 mLs (10 Units total) into the skin 3 (three) times daily before meals. (Patient taking differently: Inject 12 Units into the skin 3 (three) times daily before meals. ) 15 mL 11  . Insulin NPH, Human,, Isophane, (HUMULIN N) 100 UNIT/ML Kiwkpen 30 units in the morning, 50 units at night (Patient taking differently: 37 units in the morning, 56 units at night) 30 mL 11  . Insulin Pen Needle (BD PEN NEEDLE MICRO U/F) 32G X 6 MM MISC USE AS DIRECTED TO INJECT INSULIN 100 each 11  . Lancets (ONETOUCH ULTRASOFT) lancets Use as instructed 200 each 5  . Prenatal Vit-Fe Fumarate-FA (MULTIVITAMIN-PRENATAL) 27-0.8 MG TABS tablet Take 1 tablet by mouth daily.     . sertraline (ZOLOFT) 50 MG tablet TAKE 1 TABLET BY MOUTH EVERY DAY 90 tablet 1  . albuterol (VENTOLIN HFA) 108 (90 Base) MCG/ACT inhaler Inhale 2 puffs into the lungs every 4 (four) hours as needed for wheezing or shortness of breath. (Patient not taking: Reported on 01/07/2020) 6.7 g 2  . montelukast (SINGULAIR) 10 MG tablet Take 1 tablet (10 mg total) by mouth daily. (Patient not taking: Reported on 01/07/2020) 30 tablet 2    No Known Allergies  Social History   Socioeconomic History  . Marital status: Married    Spouse name: Not on file  . Number of children: Not on file  . Years of education: Not on file  .  Highest education level: Not on file  Occupational History  . Not on file  Tobacco Use  . Smoking status: Never Smoker  . Smokeless tobacco: Never Used  Vaping Use  . Vaping Use: Never used  Substance and Sexual Activity  . Alcohol use: Not Currently  . Drug use: No  . Sexual activity: Yes    Partners: Male  Other Topics Concern  . Not on file  Social History Narrative  . Not on file   Social Determinants of Health   Financial Resource Strain:   . Difficulty of Paying Living Expenses:   Food Insecurity:   . Worried About Community education officer in the Last Year:   . Barista in the Last Year:   Transportation Needs:   . Freight forwarder (Medical):   Marland Kitchen Lack of Transportation (Non-Medical):   Physical Activity:   . Days of Exercise per Week:   . Minutes of Exercise per Session:   Stress:   . Feeling of Stress :   Social Connections:   . Frequency of Communication with Friends and Family:   . Frequency of Social Gatherings with Friends and Family:   . Attends Religious Services:   . Active Member of Clubs or Organizations:   . Attends Banker Meetings:   Marland Kitchen Marital Status:   Intimate Partner Violence:   . Fear of Current or Ex-Partner:   . Emotionally Abused:   Marland Kitchen Physically Abused:   . Sexually Abused:     Family History  Problem Relation Age of Onset  . Breast cancer Paternal Grandmother 67  . Diabetes Father   . Diabetes Sister      Review of Systems  Constitutional: Negative for chills and fever.  HENT: Negative for congestion, ear discharge, ear pain, hearing loss, sinus pain and sore throat.   Eyes: Negative for blurred vision and double vision.  Respiratory: Negative for cough, shortness of breath and wheezing.   Cardiovascular: Negative for chest pain, palpitations and leg swelling.  Gastrointestinal: Negative for abdominal pain, blood in stool, constipation, diarrhea, heartburn, melena, nausea and vomiting.  Genitourinary: Negative for dysuria, flank pain, frequency, hematuria and urgency.       Positive for lower pelvic pain  Musculoskeletal: Negative for back pain, joint pain and myalgias.       Positive for left leg numbness  Skin: Negative for itching and rash.  Neurological: Negative for dizziness, tingling, tremors, sensory change, speech change, focal weakness, seizures, loss of consciousness, weakness and headaches.  Endo/Heme/Allergies: Negative for environmental allergies. Does not bruise/bleed easily.  Psychiatric/Behavioral: Negative for depression,  hallucinations, memory loss, substance abuse and suicidal ideas. The patient is not nervous/anxious and does not have insomnia.      Physical Exam: BP 120/65 (BP Location: Left Arm)   Pulse 96   Temp 98.5 F (36.9 C) (Oral)   Resp 17   LMP 04/23/2019   Constitutional: Well nourished, well developed female in no acute distress.  HEENT: normal Skin: Warm and dry.  Cardiovascular: Regular rate and rhythm.   Extremity: trace edema, no evidence of DVT  Respiratory: Clear to auscultation bilateral. Normal respiratory effort Abdomen: FHT present Back: no CVAT Neuro: DTRs 2+, Cranial nerves grossly intact Psych: Alert and Oriented x3. No memory deficits. Normal mood and affect.  MS: normal gait, normal bilateral lower extremity ROM/strength/stability.  Pelvic exam: deferred  Toco: negative for contractions Fetal Well Being: 155 bpm, moderate variability, +accelerations, -decelerations   Consults:  None  Significant Findings/ Diagnostic Studies: none  Procedures: NST  Hospital Course: The patient was admitted to Labor and Delivery Triage for observation.   Discharge Condition: good  Disposition: Discharge disposition: 01-Home or Self Care  Diet: Healthy diabetic diet  Discharge Activity: Activity as tolerated Hip or abdominal support band, heat/ice, stretching/exercise, massage, chiropractic or acupuncture   Discharge Instructions    Discharge activity:  No Restrictions   Complete by: As directed    Discharge diet:  No restrictions   Complete by: As directed    Fetal Kick Count:  Lie on our left side for one hour after a meal, and count the number of times your baby kicks.  If it is less than 5 times, get up, move around and drink some juice.  Repeat the test 30 minutes later.  If it is still less than 5 kicks in an hour, notify your doctor.   Complete by: As directed    No sexual activity restrictions   Complete by: As directed    Notify physician for a general feeling  that "something is not right"   Complete by: As directed    Notify physician for increase or change in vaginal discharge   Complete by: As directed    Notify physician for intestinal cramps, with or without diarrhea, sometimes described as "gas pain"   Complete by: As directed    Notify physician for leaking of fluid   Complete by: As directed    Notify physician for low, dull backache, unrelieved by heat or Tylenol   Complete by: As directed    Notify physician for menstrual like cramps   Complete by: As directed    Notify physician for pelvic pressure   Complete by: As directed    Notify physician for uterine contractions.  These may be painless and feel like the uterus is tightening or the baby is  "balling up"   Complete by: As directed    Notify physician for vaginal bleeding   Complete by: As directed    PRETERM LABOR:  Includes any of the follwing symptoms that occur between 20 - [redacted] weeks gestation.  If these symptoms are not stopped, preterm labor can result in preterm delivery, placing your baby at risk   Complete by: As directed      Allergies as of 01/07/2020   No Known Allergies     Medication List    TAKE these medications   albuterol 108 (90 Base) MCG/ACT inhaler Commonly known as: Ventolin HFA Inhale 2 puffs into the lungs every 4 (four) hours as needed for wheezing or shortness of breath.   BD Pen Needle Micro U/F 32G X 6 MM Misc Generic drug: Insulin Pen Needle USE AS DIRECTED TO INJECT INSULIN   ferrous sulfate 325 (65 FE) MG tablet Take 325 mg by mouth daily with breakfast.   Fluticasone-Salmeterol 250-50 MCG/DOSE Aepb Commonly known as: Advair Diskus Inhale 2 puffs into the lungs in the morning and at bedtime.   glucose blood test strip Use as instructed   insulin lispro 100 UNIT/ML KwikPen Commonly known as: HumaLOG KwikPen Inject 0.1 mLs (10 Units total) into the skin 3 (three) times daily before meals. What changed: how much to take   Insulin  NPH (Human) (Isophane) 100 UNIT/ML Kiwkpen Commonly known as: HUMULIN N 30 units in the morning, 50 units at night What changed: additional instructions   montelukast 10 MG tablet Commonly known as: SINGULAIR Take 1 tablet (10 mg total) by  mouth daily.   multivitamin-prenatal 27-0.8 MG Tabs tablet Take 1 tablet by mouth daily.   onetouch ultrasoft lancets Use as instructed   sertraline 50 MG tablet Commonly known as: ZOLOFT TAKE 1 TABLET BY MOUTH EVERY DAY       Follow-up Information    [facility]. Go to.   Specialty: Obstetrics and Gynecology Why: scheduled prenatal appointment Contact information: [address] [place] [address] [phone]              Total time spent taking care of this patient: 30 minutes  Signed: Tresea Mall, CNM  01/07/2020, 2:32 PM

## 2020-01-08 ENCOUNTER — Inpatient Hospital Stay: Payer: Managed Care, Other (non HMO)

## 2020-01-08 ENCOUNTER — Other Ambulatory Visit: Payer: Self-pay | Admitting: Obstetrics and Gynecology

## 2020-01-08 DIAGNOSIS — O99213 Obesity complicating pregnancy, third trimester: Secondary | ICD-10-CM

## 2020-01-08 DIAGNOSIS — O24113 Pre-existing diabetes mellitus, type 2, in pregnancy, third trimester: Secondary | ICD-10-CM

## 2020-01-09 ENCOUNTER — Ambulatory Visit (INDEPENDENT_AMBULATORY_CARE_PROVIDER_SITE_OTHER): Payer: Managed Care, Other (non HMO) | Admitting: Obstetrics and Gynecology

## 2020-01-09 ENCOUNTER — Encounter: Payer: Self-pay | Admitting: Hematology and Oncology

## 2020-01-09 ENCOUNTER — Other Ambulatory Visit: Payer: Self-pay

## 2020-01-09 ENCOUNTER — Inpatient Hospital Stay: Payer: Managed Care, Other (non HMO)

## 2020-01-09 ENCOUNTER — Inpatient Hospital Stay (HOSPITAL_BASED_OUTPATIENT_CLINIC_OR_DEPARTMENT_OTHER): Payer: Managed Care, Other (non HMO) | Admitting: Hematology and Oncology

## 2020-01-09 ENCOUNTER — Encounter: Payer: Self-pay | Admitting: Obstetrics and Gynecology

## 2020-01-09 ENCOUNTER — Other Ambulatory Visit: Payer: Self-pay | Admitting: Hematology and Oncology

## 2020-01-09 VITALS — BP 128/72 | Ht 63.0 in | Wt 242.6 lb

## 2020-01-09 VITALS — BP 112/59 | HR 102 | Temp 97.5°F | Wt 242.1 lb

## 2020-01-09 DIAGNOSIS — E538 Deficiency of other specified B group vitamins: Secondary | ICD-10-CM

## 2020-01-09 DIAGNOSIS — O99210 Obesity complicating pregnancy, unspecified trimester: Secondary | ICD-10-CM

## 2020-01-09 DIAGNOSIS — D509 Iron deficiency anemia, unspecified: Secondary | ICD-10-CM

## 2020-01-09 DIAGNOSIS — O0993 Supervision of high risk pregnancy, unspecified, third trimester: Secondary | ICD-10-CM

## 2020-01-09 DIAGNOSIS — O99213 Obesity complicating pregnancy, third trimester: Secondary | ICD-10-CM

## 2020-01-09 DIAGNOSIS — E611 Iron deficiency: Secondary | ICD-10-CM | POA: Diagnosis not present

## 2020-01-09 DIAGNOSIS — O99013 Anemia complicating pregnancy, third trimester: Secondary | ICD-10-CM | POA: Diagnosis not present

## 2020-01-09 DIAGNOSIS — Z3A35 35 weeks gestation of pregnancy: Secondary | ICD-10-CM | POA: Diagnosis not present

## 2020-01-09 DIAGNOSIS — J4550 Severe persistent asthma, uncomplicated: Secondary | ICD-10-CM

## 2020-01-09 DIAGNOSIS — O0990 Supervision of high risk pregnancy, unspecified, unspecified trimester: Secondary | ICD-10-CM

## 2020-01-09 DIAGNOSIS — Z98891 History of uterine scar from previous surgery: Secondary | ICD-10-CM

## 2020-01-09 DIAGNOSIS — Z6841 Body Mass Index (BMI) 40.0 and over, adult: Secondary | ICD-10-CM

## 2020-01-09 LAB — POCT URINALYSIS DIPSTICK OB
Glucose, UA: NEGATIVE
Urobilinogen, UA: 0.2 E.U./dL

## 2020-01-09 LAB — CBC WITH DIFFERENTIAL/PLATELET
Abs Immature Granulocytes: 0.06 10*3/uL (ref 0.00–0.07)
Basophils Absolute: 0 10*3/uL (ref 0.0–0.1)
Basophils Relative: 0 %
Eosinophils Absolute: 0.2 10*3/uL (ref 0.0–0.5)
Eosinophils Relative: 2 %
HCT: 34.7 % — ABNORMAL LOW (ref 36.0–46.0)
Hemoglobin: 11.3 g/dL — ABNORMAL LOW (ref 12.0–15.0)
Immature Granulocytes: 0 %
Lymphocytes Relative: 14 %
Lymphs Abs: 1.9 10*3/uL (ref 0.7–4.0)
MCH: 25.3 pg — ABNORMAL LOW (ref 26.0–34.0)
MCHC: 32.6 g/dL (ref 30.0–36.0)
MCV: 77.6 fL — ABNORMAL LOW (ref 80.0–100.0)
Monocytes Absolute: 0.5 10*3/uL (ref 0.1–1.0)
Monocytes Relative: 4 %
Neutro Abs: 10.9 10*3/uL — ABNORMAL HIGH (ref 1.7–7.7)
Neutrophils Relative %: 80 %
Platelets: 246 10*3/uL (ref 150–400)
RBC: 4.47 MIL/uL (ref 3.87–5.11)
RDW: 15.4 % (ref 11.5–15.5)
WBC: 13.7 10*3/uL — ABNORMAL HIGH (ref 4.0–10.5)
nRBC: 0 % (ref 0.0–0.2)

## 2020-01-09 LAB — RETICULOCYTES
Immature Retic Fract: 33.8 % — ABNORMAL HIGH (ref 2.3–15.9)
RBC.: 4.33 MIL/uL (ref 3.87–5.11)
Retic Count, Absolute: 87.5 10*3/uL (ref 19.0–186.0)
Retic Ct Pct: 2 % (ref 0.4–3.1)

## 2020-01-09 LAB — FERRITIN: Ferritin: 13 ng/mL (ref 11–307)

## 2020-01-09 MED ORDER — CYANOCOBALAMIN 1000 MCG/ML IJ SOLN
1000.0000 ug | Freq: Once | INTRAMUSCULAR | Status: AC
  Administered 2020-01-09: 1000 ug via INTRAMUSCULAR

## 2020-01-09 MED ORDER — CYANOCOBALAMIN 1000 MCG/ML IJ SOLN
INTRAMUSCULAR | Status: AC
  Filled 2020-01-09: qty 1

## 2020-01-09 NOTE — Progress Notes (Signed)
Routine Prenatal Care Visit  Subjective  Shelby Aguilar is a 34 y.o. G3P2002 at [redacted]w[redacted]d being seen today for ongoing prenatal care.  She is currently monitored for the following issues for this high-risk pregnancy and has GOITER, UNSPECIFIED; PCOD (polycystic ovarian disease); Obesity affecting pregnancy, antepartum; Supervision of high risk pregnancy, antepartum; History of 2 cesarean sections; BMI 39.0-39.9,adult; Pregestational diabetes mellitus, modified White class B; Amenorrhea; Severe persistent asthma without complication; Normocytic anemia; B12 deficiency; Thyromegaly; Labor and delivery, indication for care; and [redacted] weeks gestation of pregnancy on their problem list.  ----------------------------------------------------------------------------------- Patient reports no complaints.   Contractions: Not present. Vag. Bleeding: None.  Movement: Present. Denies leaking of fluid.  ----------------------------------------------------------------------------------- The following portions of the patient's history were reviewed and updated as appropriate: allergies, current medications, past family history, past medical history, past social history, past surgical history and problem list. Problem list updated.   Objective  Blood pressure 128/72, height 5\' 3"  (1.6 m), weight 242 lb 9.6 oz (110 kg), last menstrual period 04/23/2019, currently breastfeeding. Pregravid weight 220 lb (99.8 kg) Total Weight Gain 22 lb 9.6 oz (10.3 kg) Urinalysis:      Fetal Status: Fetal Heart Rate (bpm): 145   Movement: Present     General:  Alert, oriented and cooperative. Patient is in no acute distress.  Skin: Skin is warm and dry. No rash noted.   Cardiovascular: Normal heart rate noted  Respiratory: Normal respiratory effort, no problems with respiration noted  Abdomen: Soft, gravid, appropriate for gestational age. Pain/Pressure: Present     Pelvic:  Cervical exam deferred        Extremities: Normal  range of motion.  Edema: None  Mental Status: Normal mood and affect. Normal behavior. Normal judgment and thought content.     Assessment   34 y.o. 34 at [redacted]w[redacted]d by  02/11/2020, by Ultrasound presenting for routine prenatal visit  Plan   pregnancy Problems (from 04/23/19 to present)    Problem Noted Resolved   Severe persistent asthma without complication 11/14/2019 by 11/16/2019, MD No   Overview Addendum 12/15/2019  1:50 AM by 12/17/2019, MD    11/14/2019- Advair 250/50 2 puffs BID initiated, pulmonology referral made, advised patient to purchase peak flow meter 12/15/2019- Patient reports her asthma is markedly improved with Advair usage.       Previous Version   BMI 39.0-39.9,adult 07/19/2019 by 07/21/2019, MD No   History of 2 cesarean sections 07/10/2019 by 07/12/2019, MD No   Overview Addendum 11/14/2019  9:32 PM by 11/16/2019, MD    Repeat cesarean scheduled [redacted] wk gestation 02/06/2020      Previous Version   Obesity affecting pregnancy, antepartum 11/19/2017 by 01/19/2018, CNM No   Overview Signed 07/10/2019  2:47 PM by 07/12/2019, MD    BMI >=40 [ ]  early 1h gtt -  [ ]  u/s for dating [ ]   [ ]  nutritional goals [ ]  folic acid 1mg  [ ]  bASA (>12 weeks) [ ]  consider nutrition consult [ ]  consider maternal EKG 1st trimester [ ]  Growth u/s 28 [ ] , 32 [ ] , 36 weeks [ ]  [ ]  NST/AFI weekly 36+ weeks (36[] , 37[] , 38[] )       Supervision of high risk pregnancy, antepartum 11/19/2017 by , CNM No   Overview Addendum 08/15/2019  2:01 PM by , MD    [facility] Prenatal Labs  Dating 10 week Blood  type: A/Positive/-- (01/19 1452)   Genetic Screen  NIPS: XX Antibody:Negative (01/19 1452)  Anatomic [place]  Rubella: 10.70 (01/19 1452) Varicella: Imm  GTT Early: 147 3 hr GTT 103  / 189 / 160 /149 RPR: Non Reactive (01/19 1452)   Rhogam N/A HBsAg: Negative (01/19 1452)   TDaP vaccine          Flu  Shot:03/2019 HIV: Non Reactive (01/19 1452)   Baby Food     Breast                           GBS:   Contraception  Pap:07/10/19  CBB  No   CS/VBAC CS x2, plans CS   Support Person Husband [place]            Previous Version       NST: 145 bpm baseline, moderate variability, 15x15 accelerations, no decelerations.  Gestational age appropriate obstetric precautions including but not limited to vaginal bleeding, contractions, leaking of fluid and fetal movement were reviewed in detail with the patient.    Return in about 1 week (around 01/16/2020) for ROB in person/ NST.  Natale Milch MD [facility], [facility] 01/09/2020, 10:31 AM

## 2020-01-09 NOTE — Progress Notes (Signed)
[facility]  [address] [place], [place] [address] Phone: [phone]  Fax: [phone]   Clinic Day:  01/09/2020  Referring physician: [facility]  Chief Complaint: [place] Aguilar is a 34 y.o. female with iron deficiency anemia and B12 deficiency during the third trimester of pregnancy who is seen for 1 week assessment.   HPI:  The patient was last seen in the hematology clinic on 01/01/2020 by Consuello Masse, NP. At that time, she felt tired but overall stable.  She was 35 weeks 2 day, G3P2002. Hematocrit was 33.5, hemoglobin 11.0, MCV 76.5, platelets 235,000, WBC 13,500, ANC 10,600.  Iron studies included an iron saturation 10% and TIBC 487. Retic was 1.8%.  As her hemoglobin was slightly improved, decision was made to increase oral iron to twice a day.  Venofer was considered if her anemia did not improve or became worse.  She received B12.  She was evaluated at [facility] on 01/07/2020 for pelvic pain and pressure with left leg numbness. She was admitted to L&D for observation, and then discharged home.    She is schedule for a Cesarean section on 01/29/2020.  During the interim, she has been ok. She increased her iron intake to 2 tablets per day.  She denies any GI complaints.  She denies pica, but does have a craving for ice cold water. She does have some dizziness still and some headaches, but it has improved some. She continues to have night sweats. She has shortness of breath on exertion.    Past Medical History:  Diagnosis Date  . Asthma   . B12 deficiency   . Depression   . Family history of breast cancer    1/21 cancer genetic tesitng letter sent  . Gestational diabetes   . Iron deficiency anemia   . PCOS (polycystic ovarian syndrome)     Past Surgical History:  Procedure Laterality Date  . CESAREAN SECTION  2013  . CESAREAN SECTION N/A 07/25/2018   Procedure: CESAREAN SECTION;  Surgeon: Natale Milch, MD;   Location: [facility] ORS;  Service: Obstetrics;  Laterality: N/A;    Family History  Problem Relation Age of Onset  . Breast cancer Paternal Grandmother 60  . Diabetes Father   . Diabetes Sister     Social History:  reports that she has never smoked. She has never used smokeless tobacco. She reports previous alcohol use. She reports that she does not use drugs.She denies tobacco use. Outside of pregnancy, she drinks alcohol socially. 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 She has an 32 year old and a 17 month old, [name]. The patient is alone today.   Allergies: No Known Allergies  Current Medications: Current Outpatient Medications  Medication Sig Dispense Refill  . albuterol (VENTOLIN HFA) 108 (90 Base) MCG/ACT inhaler Inhale 2 puffs into the lungs every 4 (four) hours as needed for wheezing or shortness of breath. 6.7 g 2  . ferrous sulfate 325 (65 FE) MG tablet Take 325 mg by mouth daily with breakfast.    . Fluticasone-Salmeterol (ADVAIR DISKUS) 250-50 MCG/DOSE AEPB Inhale 2 puffs into the lungs in the morning and at bedtime. 60 each 6  . glucose blood test strip Use as instructed 125 each 5  . insulin lispro (HUMALOG KWIKPEN) 100 UNIT/ML KwikPen Inject 0.1 mLs (10 Units total) into the skin 3 (three) times daily before meals. (Patient taking differently: Inject 12 Units into the  skin 3 (three) times daily before meals. ) 15 mL 11  . Insulin NPH, Human,, Isophane, (HUMULIN N) 100 UNIT/ML Kiwkpen 30 units in the morning, 50 units at night (Patient taking differently: 37 units in the morning, 56 units at night) 30 mL 11  . Insulin Pen Needle (BD PEN NEEDLE MICRO U/F) 32G X 6 MM MISC USE AS DIRECTED TO INJECT INSULIN 100 each 11  . Lancets (ONETOUCH ULTRASOFT) lancets Use as instructed 200 each 5  . montelukast (SINGULAIR) 10 MG tablet Take 1 tablet (10 mg total) by mouth daily. 30 tablet 2  . Prenatal Vit-Fe Fumarate-FA (MULTIVITAMIN-PRENATAL) 27-0.8 MG TABS  tablet Take 1 tablet by mouth daily.     . sertraline (ZOLOFT) 50 MG tablet TAKE 1 TABLET BY MOUTH EVERY DAY 90 tablet 1   No current facility-administered medications for this visit.    Review of Systems  Constitutional: Positive for diaphoresis (night sweats; also sweats during the day) and malaise/fatigue. Negative for chills, fever and weight loss.  HENT: Negative for congestion, ear discharge, ear pain, hearing loss, nosebleeds, sinus pain, sore throat and tinnitus.   Eyes: Negative for blurred vision.       Poor vision, chronic  Respiratory: Positive for shortness of breath (on exertion). Negative for cough, hemoptysis and sputum production.        Asthma  Cardiovascular: Positive for palpitations. Negative for chest pain and leg swelling.  Gastrointestinal: Positive for constipation. Negative for abdominal pain, blood in stool, diarrhea, heartburn, melena, nausea and vomiting.       Follows low carb diet. Craves ice water.  Genitourinary: Negative for dysuria, frequency, hematuria and urgency.  Musculoskeletal: Negative for back pain, myalgias and neck pain.  Skin: Negative for itching and rash.  Neurological: Positive for dizziness and headaches (frequent). Negative for tingling, sensory change and weakness.  Endo/Heme/Allergies: Does not bruise/bleed easily.  Psychiatric/Behavioral: Negative for depression and memory loss. The patient is not nervous/anxious and does not have insomnia.   All other systems reviewed and are negative.  Performance status (ECOG): 1 - Symptomatic but completely ambulatory   Vitals Blood pressure (!) 112/59, pulse (!) 102, temperature (!) 97.5 F (36.4 C), temperature source Tympanic, weight 242 lb 1 oz (109.8 kg), last menstrual period 04/23/2019, SpO2 100 %, currently breastfeeding.   Physical Exam Vitals and nursing note reviewed.  Constitutional:      General: She is not in acute distress.    Appearance: She is not diaphoretic.  HENT:      Head: Normocephalic and atraumatic.     Mouth/Throat:     Mouth: Mucous membranes are moist.     Pharynx: Oropharynx is clear.  Eyes:     Extraocular Movements: Extraocular movements intact.     Conjunctiva/sclera: Conjunctivae normal.     Pupils: Pupils are equal, round, and reactive to light.     Comments: Brown eyes.  Neck:     Thyroid: Thyromegaly present.  Cardiovascular:     Rate and Rhythm: Normal rate and regular rhythm.     Pulses: Normal pulses.     Heart sounds: Normal heart sounds. No murmur heard.   Pulmonary:     Effort: Pulmonary effort is normal. No respiratory distress.     Breath sounds: No wheezing or rales.  Chest:     Chest wall: No tenderness.  Abdominal:     General: Bowel sounds are normal. There is no distension.     Palpations: Abdomen is soft. There is no mass.  Tenderness: There is no abdominal tenderness. There is no guarding or rebound.     Comments: Pregnant.  Musculoskeletal:        General: No swelling or tenderness. Normal range of motion.     Cervical back: Normal range of motion and neck supple.  Lymphadenopathy:     Head:     Right side of head: No preauricular, posterior auricular or occipital adenopathy.     Left side of head: No preauricular, posterior auricular or occipital adenopathy.     Cervical: No cervical adenopathy.     Upper Body:     Right upper body: No supraclavicular or axillary adenopathy.     Left upper body: No supraclavicular or axillary adenopathy.     Lower Body: No right inguinal adenopathy. No left inguinal adenopathy.  Skin:    General: Skin is warm and dry.  Neurological:     Mental Status: She is alert and oriented to person, place, and time. Mental status is at baseline.  Psychiatric:        Mood and Affect: Mood normal.        Behavior: Behavior normal.        Thought Content: Thought content normal.        Judgment: Judgment normal.    Appointment on 01/09/2020  Component Date Value Ref Range  Status  . WBC 01/09/2020 13.7* 4.0 - 10.5 K/uL Final  . RBC 01/09/2020 4.47  3.87 - 5.11 MIL/uL Final  . Hemoglobin 01/09/2020 11.3* 12.0 - 15.0 g/dL Final  . HCT 56/38/7564 34.7* 36 - 46 % Final  . MCV 01/09/2020 77.6* 80.0 - 100.0 fL Final  . MCH 01/09/2020 25.3* 26.0 - 34.0 pg Final  . MCHC 01/09/2020 32.6  30.0 - 36.0 g/dL Final  . RDW 33/29/5188 15.4  11.5 - 15.5 % Final  . Platelets 01/09/2020 246  150 - 400 K/uL Final  . nRBC 01/09/2020 0.0  0.0 - 0.2 % Final  . Neutrophils Relative % 01/09/2020 80  % Final  . Neutro Abs 01/09/2020 10.9* 1.7 - 7.7 K/uL Final  . Lymphocytes Relative 01/09/2020 14  % Final  . Lymphs Abs 01/09/2020 1.9  0.7 - 4.0 K/uL Final  . Monocytes Relative 01/09/2020 4  % Final  . Monocytes Absolute 01/09/2020 0.5  0 - 1 K/uL Final  . Eosinophils Relative 01/09/2020 2  % Final  . Eosinophils Absolute 01/09/2020 0.2  0 - 0 K/uL Final  . Basophils Relative 01/09/2020 0  % Final  . Basophils Absolute 01/09/2020 0.0  0 - 0 K/uL Final  . Immature Granulocytes 01/09/2020 0  % Final  . Abs Immature Granulocytes 01/09/2020 0.06  0.00 - 0.07 K/uL Final   Performed at [facility], [address] [place], [place] [address]  Routine Prenatal on 01/09/2020  Component Date Value Ref Range Status  . Glucose, UA 01/09/2020 Negative  Negative Final  . Urobilinogen, UA 01/09/2020 0.2  0.2 or 1.0 E.U./dL Final    Assessment:  Shelby Aguilar is a 34 y.o. female currently [redacted] weeks pregnant with a normocytic anemia.  She is scheduled for a C section on 01/29/2020.  CBC on 11/14/2019 inlcuded a hematocrit 33.1, hemoglobin 10.7, MCV 80, and WBC 12,700 (ANC 10,100).  Ferritin was 16 with an iron saturation of 9% and a TIBC of 400 on 11/29/2009.  Hemoglobin electrophoresis was normal on 07/10/2019.  She has iron deficiency anemia.  Ferritin has been followed: 16 on 11/30/2019, 10 on  12/18/2019 and 13 on 01/09/2020.  She has B12 deficiency.  She was previously  on B12 injections.  B12 was 159 on 11/30/2019.  She began B12 injections on 12/26/2019 (last on 01/01/2020).  Medical problems include goiter, polycystic ovary disease, obesity, pregestational diabetes mellitus and severe persistent asthma.     Symptomatically, she is doing well.  Exam is stable.  She is on oral iron.  Hemoglobin is 11.3.  Plan: 1.   Labs today:  CBC with diff, ferritin, retic. 2.   Iron deficiency anemia  Hematocrit 34.7.  Hemoglobin 11.3.  MCV 77.6.   Ferritin 13.  She is tolerating oral iron well.  No Venofer needed. 3.   B12 deficiency  B12 was 132 on 12/18/2019.  She is receiving weekly B12 injections x 4 then monthly. 4.   Thyromegaly  TSH was 0.762 with a free T4 of 0.72 on 12/18/2019.  Consider thyroid ultrasound. 5.   B12 today. 6.   RTC in 1 week for labs (hemoglobin) and B12 injection. 7.   RTC in 5 weeks for MD assessment, labs (CBC with diff, ferritin, iron studies-day before), B12 and +/- Venofer.  I discussed the assessment and treatment plan with the patient.  The patient was provided an opportunity to ask questions and all were answered.  The patient agreed with the plan and demonstrated an understanding of the instructions.  The patient was advised to call back if the symptoms worsen or if the condition fails to improve as anticipated.   Graison Leinberger C. Merlene Pulling, MD, PhD    01/09/2020, 5:00 PM  I, [employer], am acting as a Neurosurgeon for [employer]. Merlene Pulling, MD.   I, Annalynne Ibanez C. Merlene Pulling, MD, have reviewed the above documentation for accuracy and completeness, and I agree with the above.

## 2020-01-09 NOTE — Progress Notes (Signed)
Patient reports some numbness and tingling in left leg. States she was seen by ER 2 days ago concerning the issue. She reports some pain in pelvic region and rates pain at 8. She denies any other concerns at this time.

## 2020-01-10 ENCOUNTER — Ambulatory Visit: Payer: Managed Care, Other (non HMO) | Attending: Maternal & Fetal Medicine

## 2020-01-10 ENCOUNTER — Other Ambulatory Visit: Payer: Self-pay

## 2020-01-10 ENCOUNTER — Ambulatory Visit: Payer: Managed Care, Other (non HMO)

## 2020-01-10 DIAGNOSIS — Z6839 Body mass index (BMI) 39.0-39.9, adult: Secondary | ICD-10-CM

## 2020-01-10 DIAGNOSIS — J4550 Severe persistent asthma, uncomplicated: Secondary | ICD-10-CM

## 2020-01-10 DIAGNOSIS — O99213 Obesity complicating pregnancy, third trimester: Secondary | ICD-10-CM | POA: Diagnosis not present

## 2020-01-10 DIAGNOSIS — O24113 Pre-existing diabetes mellitus, type 2, in pregnancy, third trimester: Secondary | ICD-10-CM | POA: Diagnosis not present

## 2020-01-10 DIAGNOSIS — Z3A35 35 weeks gestation of pregnancy: Secondary | ICD-10-CM | POA: Diagnosis not present

## 2020-01-10 DIAGNOSIS — Z98891 History of uterine scar from previous surgery: Secondary | ICD-10-CM

## 2020-01-10 DIAGNOSIS — O99210 Obesity complicating pregnancy, unspecified trimester: Secondary | ICD-10-CM

## 2020-01-10 DIAGNOSIS — O0990 Supervision of high risk pregnancy, unspecified, unspecified trimester: Secondary | ICD-10-CM

## 2020-01-14 ENCOUNTER — Other Ambulatory Visit: Payer: Managed Care, Other (non HMO)

## 2020-01-14 ENCOUNTER — Ambulatory Visit: Payer: Managed Care, Other (non HMO)

## 2020-01-14 ENCOUNTER — Ambulatory Visit: Payer: Managed Care, Other (non HMO) | Admitting: Hematology and Oncology

## 2020-01-15 ENCOUNTER — Inpatient Hospital Stay: Payer: Managed Care, Other (non HMO)

## 2020-01-16 ENCOUNTER — Other Ambulatory Visit: Payer: Self-pay

## 2020-01-16 ENCOUNTER — Ambulatory Visit (INDEPENDENT_AMBULATORY_CARE_PROVIDER_SITE_OTHER): Payer: Managed Care, Other (non HMO) | Admitting: Obstetrics and Gynecology

## 2020-01-16 ENCOUNTER — Other Ambulatory Visit (HOSPITAL_COMMUNITY)
Admission: RE | Admit: 2020-01-16 | Discharge: 2020-01-16 | Disposition: A | Discharge status: Home / Self Care | Payer: Managed Care, Other (non HMO) | Source: Ambulatory Visit | Attending: Obstetrics and Gynecology | Admitting: Obstetrics and Gynecology

## 2020-01-16 ENCOUNTER — Encounter: Payer: Self-pay | Admitting: Obstetrics and Gynecology

## 2020-01-16 VITALS — BP 136/70 | Ht 63.0 in | Wt 241.3 lb

## 2020-01-16 DIAGNOSIS — O0990 Supervision of high risk pregnancy, unspecified, unspecified trimester: Secondary | ICD-10-CM

## 2020-01-16 DIAGNOSIS — Z3A36 36 weeks gestation of pregnancy: Secondary | ICD-10-CM | POA: Diagnosis present

## 2020-01-16 DIAGNOSIS — Z98891 History of uterine scar from previous surgery: Secondary | ICD-10-CM

## 2020-01-16 DIAGNOSIS — O99210 Obesity complicating pregnancy, unspecified trimester: Secondary | ICD-10-CM

## 2020-01-16 DIAGNOSIS — Z6841 Body Mass Index (BMI) 40.0 and over, adult: Secondary | ICD-10-CM

## 2020-01-16 DIAGNOSIS — O99013 Anemia complicating pregnancy, third trimester: Secondary | ICD-10-CM

## 2020-01-16 DIAGNOSIS — O24319 Unspecified pre-existing diabetes mellitus in pregnancy, unspecified trimester: Secondary | ICD-10-CM

## 2020-01-16 DIAGNOSIS — E010 Iodine-deficiency related diffuse (endemic) goiter: Secondary | ICD-10-CM

## 2020-01-16 DIAGNOSIS — J4550 Severe persistent asthma, uncomplicated: Secondary | ICD-10-CM

## 2020-01-16 NOTE — Patient Instructions (Signed)
Cesarean Delivery Cesarean birth, or cesarean delivery, is the surgical delivery of a baby through an incision in the abdomen and the uterus. This may be referred to as a C-section. This procedure may be scheduled ahead of time, or it may be done in an emergency situation. Tell a health care provider about:  Any allergies you have.  All medicines you are taking, including vitamins, herbs, eye drops, creams, and over-the-counter medicines.  Any problems you or family members have had with anesthetic medicines.  Any blood disorders you have.  Any surgeries you have had.  Any medical conditions you have.  Whether you or any members of your family have a history of deep vein thrombosis (DVT) or pulmonary embolism (PE). What are the risks? Generally, this is a safe procedure. However, problems may occur, including:  Infection.  Bleeding.  Allergic reactions to medicines.  Damage to other structures or organs.  Blood clots.  Injury to your baby. What happens before the procedure? General instructions  Follow instructions from your health care provider about eating or drinking restrictions.  If you know that you are going to have a cesarean delivery, do not shave your pubic area. Shaving before the procedure may increase your risk of infection.  Plan to have someone take you home from the hospital.  Ask your health care provider what steps will be taken to prevent infection. These may include: ? Removing hair at the surgery site. ? Washing skin with a germ-killing soap. ? Taking antibiotic medicine.  Depending on the reason for your cesarean delivery, you may have a physical exam or additional testing, such as an ultrasound.  You may have your blood or urine tested. Questions for your health care provider  Ask your health care provider about: ? Changing or stopping your regular medicines. This is especially important if you are taking diabetes medicines or blood  thinners. ? Your pain management plan. This is especially important if you plan to breastfeed your baby. ? How long you will be in the hospital after the procedure. ? Any concerns you may have about receiving blood products, if you need them during the procedure. ? Cord blood banking, if you plan to collect your baby's umbilical cord blood.  You may also want to ask your health care provider: ? Whether you will be able to hold or breastfeed your baby while you are still in the operating room. ? Whether your baby can stay with you immediately after the procedure and during your recovery. ? Whether a family member or a person of your choice can go with you into the operating room and stay with you during the procedure, immediately after the procedure, and during your recovery. What happens during the procedure?   An IV will be inserted into one of your veins.  Fluid and medicines, such as antibiotics, will be given before the surgery.  Fetal monitors will be placed on your abdomen to check your baby's heart rate.  You may be given a special warming gown to wear to keep your temperature stable.  A catheter may be inserted into your bladder through your urethra. This drains your urine during the procedure.  You may be given one or more of the following: ? A medicine to numb the area (local anesthetic). ? A medicine to make you fall asleep (general anesthetic). ? A medicine (regional anesthetic) that is injected into your back or through a small thin tube placed in your back (spinal anesthetic or epidural anesthetic).   This numbs everything below the injection site and allows you to stay awake during your procedure. If this makes you feel nauseous, tell your health care provider. Medicines will be available to help reduce any nausea you may feel.  An incision will be made in your abdomen, and then in your uterus.  If you are awake during your procedure, you may feel tugging and pulling in  your abdomen, but you should not feel pain. If you feel pain, tell your health care provider immediately.  Your baby will be removed from your uterus. You may feel more pressure or pushing while this happens.  Immediately after birth, your baby will be dried and kept warm. You may be able to hold and breastfeed your baby.  The umbilical cord may be clamped and cut during this time. This usually occurs after waiting a period of 1-2 minutes after delivery.  Your placenta will be removed from your uterus.  Your incisions will be closed with stitches (sutures). Staples, skin glue, or adhesive strips may also be applied to the incision in your abdomen.  Bandages (dressings) may be placed over the incision in your abdomen. The procedure may vary among health care providers and hospitals. What happens after the procedure?  Your blood pressure, heart rate, breathing rate, and blood oxygen level will be monitored until you are discharged from the hospital.  You may continue to receive fluids and medicines through an IV.  You will have some pain. Medicines will be available to help control your pain.  To help prevent blood clots: ? You may be given medicines. ? You may have to wear compression stockings or devices. ? You will be encouraged to walk around when you are able.  Hospital staff will encourage and support bonding with your baby. Your hospital may have you and your baby to stay in the same room (rooming in) during your hospital stay to encourage successful bonding and breastfeeding.  You may be encouraged to cough and breathe deeply often. This helps to prevent lung problems.  If you have a catheter draining your urine, it will be removed as soon as possible after your procedure. Summary  Cesarean birth, or cesarean delivery, is the surgical delivery of a baby through an incision in the abdomen and the uterus.  Follow instructions from your health care provider about eating or  drinking restrictions before the procedure.  You will have some pain after the procedure. Medicines will be available to help control your pain.  Hospital staff will encourage and support bonding with your baby after the procedure. Your hospital may have you and your baby to stay in the same room (rooming in) during your hospital stay to encourage successful bonding and breastfeeding. This information is not intended to replace advice given to you by your health care provider. Make sure you discuss any questions you have with your health care provider. Document Revised: 12/12/2017 Document Reviewed: 12/12/2017 Elsevier Patient Education  2020 Elsevier Inc.  

## 2020-01-16 NOTE — Progress Notes (Signed)
Routine Prenatal Care Visit  Subjective  Shelby Aguilar is a 34 y.o. G3P2002 at [redacted]w[redacted]d being seen today for ongoing prenatal care.  She is currently monitored for the following issues for this high-risk pregnancy and has GOITER, UNSPECIFIED; PCOD (polycystic ovarian disease); Obesity affecting pregnancy, antepartum; Supervision of high risk pregnancy, antepartum; History of 2 cesarean sections; BMI 39.0-39.9,adult; Pregestational diabetes mellitus, modified White class B; Amenorrhea; Severe persistent asthma without complication; Normocytic anemia; B12 deficiency; Thyromegaly; Labor and delivery, indication for care; and [redacted] weeks gestation of pregnancy on their problem list.  ----------------------------------------------------------------------------------- Patient reports no complaints.  Denies headaches, vision changes, RUQ pain.  Contractions: Not present. Vag. Bleeding: None.  Movement: Present. Denies leaking of fluid.  ----------------------------------------------------------------------------------- The following portions of the patient's history were reviewed and updated as appropriate: allergies, current medications, past family history, past medical history, past social history, past surgical history and problem list. Problem list updated.   Objective  Blood pressure (!) 136/70, height 5\' 3"  (1.6 m), weight (!) 241 lb 4.8 oz (109.5 kg), last menstrual period 04/23/2019, currently breastfeeding. Pregravid weight 220 lb (99.8 kg) Total Weight Gain 21 lb 4.8 oz (9.662 kg) Urinalysis:      Fetal Status: Fetal Heart Rate (bpm): 125   Movement: Present     General:  Alert, oriented and cooperative. Patient is in no acute distress.  Skin: Skin is warm and dry. No rash noted.   Cardiovascular: Normal heart rate noted  Respiratory: Normal respiratory effort, no problems with respiration noted  Abdomen: Soft, gravid, appropriate for gestational age. Pain/Pressure: Present       Pelvic:  Cervical exam deferred        Extremities: Normal range of motion.  Edema: None  Mental Status: Normal mood and affect. Normal behavior. Normal judgment and thought content.     Assessment   34 y.o. 32 at [redacted]w[redacted]d by  02/11/2020, by Ultrasound presenting for routine prenatal visit  Plan   pregnancy Problems (from 04/23/19 to present)    Problem Noted Resolved   Severe persistent asthma without complication 11/14/2019 by 11/16/2019, MD No   Overview Addendum 12/15/2019  1:50 AM by 12/17/2019, MD    11/14/2019- Advair 250/50 2 puffs BID initiated, pulmonology referral made, advised patient to purchase peak flow meter 12/15/2019- Patient reports her asthma is markedly improved with Advair usage.       Previous Version   BMI 39.0-39.9,adult 07/19/2019 by 07/21/2019, MD No   History of 2 cesarean sections 07/10/2019 by 07/12/2019, MD No   Overview Addendum 11/14/2019  9:32 PM by 11/16/2019, MD    Repeat cesarean scheduled [redacted] wk gestation 02/06/2020      Previous Version   Obesity affecting pregnancy, antepartum 11/19/2017 by 01/19/2018, CNM No   Overview Signed 07/10/2019  2:47 PM by 07/12/2019, MD    BMI >=40 [ ]  early 1h gtt -  [ ]  u/s for dating [ ]   [ ]  nutritional goals [ ]  folic acid 1mg  [ ]  bASA (>12 weeks) [ ]  consider nutrition consult [ ]  consider maternal EKG 1st trimester [ ]  Growth u/s 28 [ ] , 32 [ ] , 36 weeks [ ]  [ ]  NST/AFI weekly 36+ weeks (36[] , 37[] , 38[] )       Supervision of high risk pregnancy, antepartum 11/19/2017 by , CNM No   Overview Addendum 08/15/2019  2:01 PM by , MD    Clinic  [facility]renatal [facility]  Dating 10 week [place] Blood type: A/Positive/-- (01/19 1452)   Genetic Screen  NIPS: XX Antibody:Negative (01/19 1452)  Anatomic [place]  Rubella: 10.70 (01/19 1452) Varicella: Imm  GTT Early: 147 3 hr GTT 103  / 189 / 160 /149 RPR: Non Reactive (01/19 1452)   Rhogam N/A  HBsAg: Negative (01/19 1452)   TDaP vaccine          Flu Shot:03/2019 HIV: Non Reactive (01/19 1452)   Baby Food     Breast                           GBS:   Contraception  Pap:07/10/19  CBB  No   CS/VBAC CS x2, plans CS   Support Person Husband [place]            Previous Version      Forgot glucose log today- glucose values have been normal per patient Sleep study last night GBS/GC/CT today NST reactive Thyroid US ordered  Gestational age appropriate obstetric precautions including but not limited to vaginal bleeding, contractions, leaking of fluid and fetal movement were reviewed in detail with the patient.    Return in about 1 week (around 01/23/2020) for ROB/NST.  Natale Milch MD [facility], [facility] 01/16/2020, 3:51 PM

## 2020-01-17 ENCOUNTER — Ambulatory Visit: Payer: Managed Care, Other (non HMO)

## 2020-01-17 ENCOUNTER — Other Ambulatory Visit: Payer: Self-pay

## 2020-01-17 ENCOUNTER — Ambulatory Visit: Payer: Managed Care, Other (non HMO) | Attending: Maternal & Fetal Medicine

## 2020-01-17 ENCOUNTER — Other Ambulatory Visit: Payer: Self-pay | Admitting: Obstetrics and Gynecology

## 2020-01-17 DIAGNOSIS — O24313 Unspecified pre-existing diabetes mellitus in pregnancy, third trimester: Secondary | ICD-10-CM | POA: Diagnosis not present

## 2020-01-17 DIAGNOSIS — O99213 Obesity complicating pregnancy, third trimester: Secondary | ICD-10-CM | POA: Insufficient documentation

## 2020-01-17 DIAGNOSIS — E119 Type 2 diabetes mellitus without complications: Secondary | ICD-10-CM | POA: Insufficient documentation

## 2020-01-17 DIAGNOSIS — O24113 Pre-existing diabetes mellitus, type 2, in pregnancy, third trimester: Secondary | ICD-10-CM

## 2020-01-17 DIAGNOSIS — Z3A36 36 weeks gestation of pregnancy: Secondary | ICD-10-CM | POA: Diagnosis not present

## 2020-01-18 LAB — CERVICOVAGINAL ANCILLARY ONLY
Chlamydia: NEGATIVE
Comment: NEGATIVE
Comment: NORMAL
Neisseria Gonorrhea: NEGATIVE

## 2020-01-19 LAB — CULTURE, BETA STREP (GROUP B ONLY): Strep Gp B Culture: POSITIVE — AB

## 2020-01-21 ENCOUNTER — Ambulatory Visit: Payer: Managed Care, Other (non HMO) | Admitting: Primary Care

## 2020-01-21 ENCOUNTER — Other Ambulatory Visit: Payer: Self-pay | Admitting: Obstetrics and Gynecology

## 2020-01-21 ENCOUNTER — Encounter: Payer: Self-pay | Admitting: Primary Care

## 2020-01-21 ENCOUNTER — Other Ambulatory Visit: Payer: Self-pay

## 2020-01-21 DIAGNOSIS — O24913 Unspecified diabetes mellitus in pregnancy, third trimester: Secondary | ICD-10-CM

## 2020-01-21 DIAGNOSIS — O99213 Obesity complicating pregnancy, third trimester: Secondary | ICD-10-CM

## 2020-01-21 DIAGNOSIS — J4550 Severe persistent asthma, uncomplicated: Secondary | ICD-10-CM

## 2020-01-21 DIAGNOSIS — K219 Gastro-esophageal reflux disease without esophagitis: Secondary | ICD-10-CM

## 2020-01-21 MED ORDER — PULMICORT FLEXHALER 90 MCG/ACT IN AEPB: 2.0000 | INHALATION_SPRAY | Freq: Two times a day (BID) | RESPIRATORY_TRACT | 0 refills | Status: DC

## 2020-01-21 MED ORDER — FAMOTIDINE 20 MG PO TABS: 20.0000 mg | ORAL_TABLET | Freq: Every day | ORAL | 0 refills | Status: DC

## 2020-01-21 NOTE — Progress Notes (Signed)
_0  ID: Shelby Aguilar, female    DOB: 23-Jun-1985, 34 y.o.   MRN: [id number]  Chief Complaint  Patient presents with   Follow-up    stopped advair at last OV- she has used it once since last OV due to increased wheezing. c/o sob with exertion and wheezing.    Referring provider: Verita Lamb, NP  HPI: 34 year old female, never smoked. PMH significant for severe persistent asthma. Patient of Dr. Patsey Berthold, seen for intial consult on 01/02/20. Recommended to continue Advair 250-50 and added Singulair.   01/21/2020 Patient presents today for 2 week follow-up asthma. Overall, she is doing ok but still struggles with wheezing. She has not been taking Advair d/t risk associated with this particular inhaler use in pregnancy and limited data. She has been taking Singulair 99m at bedtime. Her asthma symptoms are primary exercises induced and worse with humidity or at night. She had asthma before being pregnant but was generally well controlled on prn albuterol. Never been hospitalized or intubated for asthma. She does have reflux symptoms which she takes Tums as needed.  Planning for c-section next week.   No Known Allergies  Immunization History  Administered Date(s) Administered   DTP 02/18/1988, 04/12/1988, 06/16/1988, 04/21/1989, 01/09/1992   HPV Quadrivalent 05/03/2007, 07/19/2007   Hepatitis A 05/03/2007   Hepatitis A, Adult 05/03/2007   Hepatitis A, Ped/Adol-2 Dose 11/23/2005   Hepatitis B 03/19/1999, 04/23/1999, 09/17/1999   HiB (PRP-OMP) 08/04/1989   Hpv 05/03/2007, 07/19/2007   Influenza, Seasonal, Injecte, Preservative Fre 04/12/2013   Influenza,inj,Quad PF,6+ Mos 07/05/2012, 02/27/2018   Influenza-Unspecified 03/05/2019   MMR 04/21/1989, 09/29/1992   Meningococcal Conjugate 09/29/2005   OPV 02/18/1988, 04/12/1988, 04/22/1989, 01/09/1992   Tdap 03/10/2005, 05/31/2018, 12/14/2019   Typhoid Inactivated 05/25/2007   Yellow Fever 05/25/2007    Past  Medical History:  Diagnosis Date   Asthma    B12 deficiency    Depression    Family history of breast cancer    1/21 cancer genetic tesitng letter sent   Gestational diabetes    Iron deficiency anemia    PCOS (polycystic ovarian syndrome)     Tobacco History: Social History   Tobacco Use  Smoking Status Never Smoker  Smokeless Tobacco Never Used   Counseling given: Not Answered   Outpatient Medications Prior to Visit  Medication Sig Dispense Refill   albuterol (VENTOLIN HFA) 108 (90 Base) MCG/ACT inhaler Inhale 2 puffs into the lungs every 4 (four) hours as needed for wheezing or shortness of breath. 6.7 g 2   chlorhexidine (PERIDEX) 0.12 % solution SMARTSIG:By Mouth     ferrous sulfate 325 (65 FE) MG tablet Take 325 mg by mouth 2 (two) times daily with a meal.      Fluticasone-Salmeterol (ADVAIR DISKUS) 250-50 MCG/DOSE AEPB Inhale 2 puffs into the lungs in the morning and at bedtime. 60 each 6   glucose blood test strip Use as instructed 125 each 5   insulin lispro (HUMALOG KWIKPEN) 100 UNIT/ML KwikPen Inject 0.1 mLs (10 Units total) into the skin 3 (three) times daily before meals. (Patient taking differently: Inject 12 Units into the skin 3 (three) times daily before meals. ) 15 mL 11   Insulin NPH, Human,, Isophane, (HUMULIN N) 100 UNIT/ML Kiwkpen 30 units in the morning, 50 units at night (Patient taking differently: Inject 37-56 Units into the skin See admin instructions. Inject 37 units into the skin in the morning and 56 units at night) 30 mL 11   Insulin  Pen Needle (BD PEN NEEDLE MICRO U/F) 32G X 6 MM MISC USE AS DIRECTED TO INJECT INSULIN 100 each 11   Lancets (ONETOUCH ULTRASOFT) lancets Use as instructed 200 each 5   montelukast (SINGULAIR) 10 MG tablet Take 1 tablet (10 mg total) by mouth daily. 30 tablet 2   Prenatal Vit-Fe Fumarate-FA (MULTIVITAMIN-PRENATAL) 27-0.8 MG TABS tablet Take 1 tablet by mouth daily.      sertraline (ZOLOFT) 50 MG tablet  TAKE 1 TABLET BY MOUTH EVERY DAY 90 tablet 1   No facility-administered medications prior to visit.    Review of Systems  Review of Systems  Constitutional: Negative.   Respiratory: Positive for wheezing.   Cardiovascular: Negative.     Physical Exam  BP 118/70 (BP Location: Left Arm, Cuff Size: Normal)    Pulse (!) 103    Temp (!) 97.5 F (36.4 C) (Temporal)    Ht 5' 3" (1.6 m)    Wt 239 lb (108.4 kg)    LMP 04/23/2019    SpO2 97%    BMI 42.34 kg/m  Physical Exam Constitutional:      Appearance: Normal appearance.  HENT:     Head: Normocephalic and atraumatic.  Cardiovascular:     Rate and Rhythm: Normal rate and regular rhythm.  Pulmonary:     Breath sounds: Wheezing present.     Comments: Mild inspiratory wheezing. No respiratory distress Abdominal:     Comments: Pregnant   Neurological:     General: No focal deficit present.     Mental Status: She is alert and oriented to person, place, and time. Mental status is at baseline.  Psychiatric:        Mood and Affect: Mood normal.        Behavior: Behavior normal.        Thought Content: Thought content normal.        Judgment: Judgment normal.      Lab Results:  CBC    Component Value Date/Time   WBC 13.7 (H) 01/09/2020 1339   RBC 4.33 01/09/2020 1339   RBC 4.47 01/09/2020 1339   HGB 11.3 (L) 01/09/2020 1339   HGB 10.7 (L) 11/14/2019 0931   HCT 34.7 (L) 01/09/2020 1339   HCT 33.1 (L) 11/14/2019 0931   PLT 246 01/09/2020 1339   PLT 300 07/10/2019 1452   MCV 77.6 (L) 01/09/2020 1339   MCV 80 11/14/2019 0931   MCV 84 12/15/2011 0620   MCH 25.3 (L) 01/09/2020 1339   MCHC 32.6 01/09/2020 1339   RDW 15.4 01/09/2020 1339   RDW 13.9 11/14/2019 0931   RDW 14.2 12/15/2011 0620   LYMPHSABS 1.9 01/09/2020 1339   LYMPHSABS 1.9 11/14/2019 0931   LYMPHSABS 2.0 12/15/2011 0620   MONOABS 0.5 01/09/2020 1339   MONOABS 0.9 12/15/2011 0620   EOSABS 0.2 01/09/2020 1339   EOSABS 0.2 11/14/2019 0931   EOSABS 0.3  12/15/2011 0620   BASOSABS 0.0 01/09/2020 1339   BASOSABS 0.0 11/14/2019 0931   BASOSABS 0.0 12/15/2011 0620    BMET    Component Value Date/Time   NA 139 05/03/2007 1204   K 3.8 05/03/2007 1204   CL 107 05/03/2007 1204   CO2 25 05/03/2007 1204   GLUCOSE 90 05/03/2007 1204   BUN 11 05/03/2007 1204   CREATININE 0.8 05/03/2007 1204   CALCIUM 9.2 05/03/2007 1204   GFRNONAA 98 05/03/2007 1204   GFRAA 119 05/03/2007 1204    BNP No results found for: BNP  ProBNP No  results found for: PROBNP  Imaging: [place] MFM FETAL BPP WO NON STRESS  Result Date: 01/17/2020 ----------------------------------------------------------------------  OBSTETRICS REPORT                       (Signed Final 01/17/2020 10:17 am) ---------------------------------------------------------------------- Patient Info  ID #:       [id number]                          D.O.B.:  Mar 12, 1986 (34 yrs)  Name:       Shelby Aguilar             Visit Date: 01/17/2020 09:57 am ---------------------------------------------------------------------- Performed By  Attending:        Sander Nephew      Ref. Address:      [address], [place],                                                              [place] [address]  Performed By:     Marco Collie           [facility]          [facility]                                                              at [place]  Referred By:      Homero Fellers ---------------------------------------------------------------------- Orders  #  Description                           Code        Ordered By  1  [place] MFM FETAL BPP WO NON  25366.44    [facility]  ----------------------------------------------------------------------  #  Order #                     Accession #                Episode #  1  [id number]                   [id number]                 [id number] ---------------------------------------------------------------------- Indications  [redacted] weeks gestation of pregnancy                 A6T.01  Obesity complicating pregnancy, third           O99.213  trimester  Diabetes - Pregestational,3rd trimester         O24.313 ---------------------------------------------------------------------- Fetal Evaluation  Num Of Fetuses:          1  Fetal Heart              135  Rate(bpm):  Cardiac Activity:        Observed  Presentation:            Cephalic  Placenta:                Fundal  P. Cord Insertion:       Previously Visualized  AFI Sum(cm)     %Tile       Largest Pocket(cm)  17.2            64          5.2  RUQ(cm)       RLQ(cm)       LUQ(cm)        LLQ(cm)  3.8           3.4           4.8            5.2 ---------------------------------------------------------------------- Biophysical Evaluation  Amniotic F.V:   Within normal limits       F. Tone:         Observed  F. Movement:    Observed                   Score:           8/8  F. Breathing:   Observed ---------------------------------------------------------------------- Gestational Age  LMP:           38w 3d        Date:  04/23/19                 EDD:    01/28/20  Best:          Harolyn Rutherford 3d     Det. By:  Loman Chroman         EDD:    02/11/20                                      (07/19/19) ---------------------------------------------------------------------- Impression  Antenatal testing performed given maternal A2GDM  The biophysical profile was 8/8 with good fetal movement  and amniotic fluid volume.  Her blood sugars are appropriate and stable given her  current regimen. 37/56 06/02/11 She has < 25 % abnormal  for  FBS and <5% abnormal for 2hr PP across all meals.  ---------------------------------------------------------------------- Recommendations  Follow up BPP scheduled in 1 weeks  Delivery scheduled between 37-38 weeks. ----------------------------------------------------------------------               Sander Nephew, MD Electronically Signed Final Report   01/17/2020 10:17 am ----------------------------------------------------------------------  [place] MFM FETAL BPP WO NON STRESS  Result Date: 01/10/2020 ----------------------------------------------------------------------  OBSTETRICS REPORT                       (Signed Final 01/10/2020 10:14 am) ---------------------------------------------------------------------- Patient Info  ID #:       [id number]                          D.O.B.:  06/29/1985 (34 yrs)  Name:       Shelby Aguilar             Visit Date: 01/10/2020 09:23 am ---------------------------------------------------------------------- Performed By  Attending:        Sander Nephew      Ref. Address:      [address], [place],                                                              [place] [address]  Performed By:     Marco Collie           Location[facility]          [facility]                                                              at [place]  Referred By:  CHRISTANNA R                    SCHUMAN ---------------------------------------------------------------------- Orders  #  Description                           Code        Ordered By  1  [place] MFM FETAL BPP WO NON               76819.01    [place] ----------------------------------------------------------------------  #  Order #                     Accession #                Episode #  1   [id number]                   [id number]                 [id number] ---------------------------------------------------------------------- Indications  [redacted] weeks gestation of pregnancy                 Y5K.35  Obesity complicating pregnancy, third           O99.213  trimester  Diabetes - Pregestational,3rd trimester         O24.313 ---------------------------------------------------------------------- Fetal Evaluation  Num Of Fetuses:          1  Fetal Heart              152  Rate(bpm):  Cardiac Activity:        Observed  Presentation:            Cephalic  Placenta:                Fundal  P. Cord Insertion:       Previously Visualized  AFI Sum(cm)     %Tile       Largest Pocket(cm)  17.5            65          7.4  RUQ(cm)       RLQ(cm)       LUQ(cm)        LLQ(cm)  4.7           1.9           3.5            7.4 ---------------------------------------------------------------------- Biophysical Evaluation  Amniotic F.V:   Within normal limits       F. Tone:         Observed  F. Movement:    Observed                   Score:           8/8  F. Breathing:   Observed ---------------------------------------------------------------------- Gestational Age  LMP:           37w 3d        Date:  04/23/19                 EDD:    01/28/20  Best:  35w 3d     Det. By:  Loman Chroman         EDD:    02/11/20                                      (07/19/19) ---------------------------------------------------------------------- Impression  Antenatal testing performed given maternal A2GDM  The biophysical profile was 8/8 with good fetal movement  and amniotic fluid volume.  Blood sugars much improved with the most recent change.  No changes to current regimen. ---------------------------------------------------------------------- Recommendations  Continue weekly testing  Planned delivery at 37-38 weeks. ----------------------------------------------------------------------               Sander Nephew, MD Electronically Signed  Final Report   01/10/2020 10:14 am ----------------------------------------------------------------------  [place] MFM FETAL BPP WO NON STRESS  Result Date: 01/03/2020 ----------------------------------------------------------------------  OBSTETRICS REPORT                       (Signed Final 01/03/2020 05:05 pm) ---------------------------------------------------------------------- Patient Info  ID #:       [id number]                          D.O.B.:  [birth year] (34 yrs)  Name:       Shelby Aguilar             Visit Date: 01/03/2020 04:33 pm ---------------------------------------------------------------------- Performed By  Attending:        Sander Nephew      Ref. Address:      [address], [place],                                                               [address]  Performed By:     Marco Collie           [facility]          [facility]                                                              [facility]  [place]  Referred By:      Homero Fellers ---------------------------------------------------------------------- Orders  #  Description                           Code        Ordered By  1  [place] MFM FETAL BPP WO NON     STRESS ----------------------------------------------------------------------  #  Order #                     Accession #                Episode #  1                              [id number] ---------------------------------------------------------------------- Indications  [redacted] weeks gestation of pregnancy                 U9W.11  Obesity complicating pregnancy, third           O99.213  trimester  Diabetes - Pregestational,3rd trimester         O24.313 ---------------------------------------------------------------------- Fetal  Evaluation  Num Of Fetuses:          1  Fetal Heart              155  Rate(bpm):  Cardiac Activity:        Observed  Presentation:            Cephalic  Placenta:                Fundal  P. Cord Insertion:       Previously Visualized  AFI Sum(cm)     %Tile       Largest Pocket(cm)  16              58          6.4  RUQ(cm)       RLQ(cm)       LUQ(cm)        LLQ(cm)  4.5           3             2.1            6.4 ---------------------------------------------------------------------- Biophysical Evaluation  Amniotic F.V:   Within normal limits       F. Tone:         Observed  F. Movement:    Observed                   N.S.T:           Reactive  F. Breathing:   Not Observed               Score:           8/10 ---------------------------------------------------------------------- Gestational Age  LMP:           36w 3d        Date:  04/23/19                 EDD:    01/28/20  Best:          34w 3d     Det. ByLoman Chroman         EDD:    02/11/20                                      (  07/19/19) ---------------------------------------------------------------------- Impression  Antenatal testing performed for T2DM  Biophysical profile 8/10 (-2 for breathing with reactive NST).  There was good fetal movement and amniotic fluid volume.  Ms. Boeding has known T2DM with insulin regimen of  12/19/2019 NPH 35 AM, 54 HS, short acting 03/30/09. Her  blood sugars are much improved overall but still >50%  abnormal FBS and 2hr PP. At this time we I recommended  increasing NPH by 2 to 37 and 56  increasing short acting  06/02/11.  We discussed daily kick counts ---------------------------------------------------------------------- Recommendations  Continue weekly testing  Repeat growth in 3 weeks.  Consider delivery between 37-39 weeks.  Increased insulin as above.  Daily kick counts. ----------------------------------------------------------------------               Sander Nephew, MD Electronically Signed Final Report    01/03/2020 05:05 pm ----------------------------------------------------------------------  [place] MFM FETAL BPP WO NON STRESS  Result Date: 12/27/2019 ----------------------------------------------------------------------  OBSTETRICS REPORT                       (Signed Final 12/27/2019 03:45 pm) ---------------------------------------------------------------------- PATIENT INFO:  ID #:       [id number]                          D.O.B.:  1985-07-12 (34 yrs)  Name:       Shelby Aguilar             Visit Date: 12/27/2019 02:45 pm ---------------------------------------------------------------------- PERFORMED BY:  Attending:        Gertie Exon,                Referred By:       Stefanie Libel                    Corenthian"Corey"                         Gilman Schmidt                    MD  Performed By:     Christena Deem RDMS ---------------------------------------------------------------------- SERVICE(S) PROVIDED:  [place] MFM FETAL BPP WO NON STRESS                        76819.01  [place] MFM OB FOLLOW UP                                   23300.76 ---------------------------------------------------------------------- INDICATIONS:  [redacted] weeks gestation of pregnancy                 A2Q.33  Obesity complicating pregnancy, third           O99.213  trimester ---------------------------------------------------------------------- FETAL EVALUATION:  Num Of Fetuses:          1  Fetal Heart              141  Rate(bpm):  Cardiac Activity:        Present  Presentation:            Vertex  Placenta:                Posterior  AFI Sum(cm)     %Tile       Largest Pocket(cm)  16.2  58          4.8  RUQ(cm)       RLQ(cm)       LUQ(cm)        LLQ(cm)  3.7           4.4           3.3            4.8 ---------------------------------------------------------------------- BIOMETRY:  BPD:      82.2  mm     G. Age:  33w 1d         34  %    CI:         70.93  %    70 - 86                                                          FL/HC:       20.3  %    19.9 -  21.5  HC:        311  mm     G. Age:  34w 6d         48  %    HC/AC:       1.05       0.96 - 1.11  AC:      297.1  mm     G. Age:  33w 5d         60  %    FL/BPD:      76.8  %    71 - 87  FL:       63.1  mm     G. Age:  32w 5d         21  %    FL/AC:       21.2  %    20 - 24  Est. FW:    2196   g    4 lb 13 oz      44  %                     m ---------------------------------------------------------------------- GESTATIONAL AGE:  LMP:           35w 3d        Date:  04/23/19                 EDD:    01/28/20  U/S Today:     33w 4d                                        EDD:    02/10/20  Best:          33w 3d     Det. ByLoman Chroman         EDD:    02/11/20                                      (07/19/19) ---------------------------------------------------------------------- ANATOMY:  Cavum:                 Visualized  Stomach:                Seen                         previously  Ventricles:            Normal appearance      Abdominal Wall:         Visualized                                                                        previously  Cerebellum:            Visualized             Cord Vessels:           3 vessels,                         previously                                     visualized previously  Posterior Fossa:       Visualized             Kidneys:                Normal appearance                         previously  Face:                  Orbits visualized      Bladder:                Seen                         previously  Lips:                  Visualized             Spine:                  Visualized                         previously                                     previously  Heart:                 4-Chamber view         Upper Extremities:      Visualized                         appears normal                                 previously  RVOT:  Normal appearance      Lower Extremities:      Visualized                                                                         previously  LVOT:                  Normal appearance ---------------------------------------------------------------------- IMPRESSION:  Known A2GDM and a BMI of 44  Normal interval growth with measurements consistent with  dates.  A biophysical profile of 8/8 was observed with good fetal  movement and amniotic fluid volume  In review of Ms. Woollard's blood sugar log >50% of her  FBS and  2hr pp breakfast readings were elevated. <25% of  her 2hr pp lunch and dinner were abnormal. She is not active  and in review of her most recent meals we discussed  adjustments that would improve her fasting blood sugar. She  notes that her most recent insulin regimen is NPH am/pm  34/54 Humalog- 03/30/09.  He blood pressure is normal at 129/74 today.  She reports good fetal activity. ---------------------------------------------------------------------- RECOMMENDATIONS:  Continue weekly testing  Consider delivery between 37-39 weeks, closer to 37 weeks  for poor blood glucose control or poor fetal testing.  HbA1c to assess overall average blood sugars.  Consider increasing am/pm NPH if blood sugars aren't  improved in 3 days. ----------------------------------------------------------------------               Sander Nephew, MD Electronically Signed Final Report   12/27/2019 03:45 pm ----------------------------------------------------------------------  [place] MFM OB FOLLOW UP  Result Date: 12/27/2019 ----------------------------------------------------------------------  OBSTETRICS REPORT                       (Signed Final 12/27/2019 03:45 pm) ---------------------------------------------------------------------- PATIENT INFO:  ID #:       [id number]                          D.O.B.:  [birth year] (34 yrs)  Name:       Shelby Aguilar             Visit Date: 12/27/2019 02:45 pm ---------------------------------------------------------------------- PERFORMED BY:  Attending:        Gertie Exon,                Referred  By:       Stefanie Libel                    Corenthian"Corey"                         Gilman Schmidt                    MD  Performed By:     Christena Deem RDMS ---------------------------------------------------------------------- SERVICE(S) PROVIDED:  [place] MFM FETAL BPP WO NON STRESS                        01093.23  [place] MFM OB FOLLOW UP  69629.52 ---------------------------------------------------------------------- INDICATIONS:  [redacted] weeks gestation of pregnancy                 W4X.32  Obesity complicating pregnancy, third           O99.213  trimester ---------------------------------------------------------------------- FETAL EVALUATION:  Num Of Fetuses:          1  Fetal Heart              141  Rate(bpm):  Cardiac Activity:        Present  Presentation:            Vertex  Placenta:                Posterior  AFI Sum(cm)     %Tile       Largest Pocket(cm)  16.2            58          4.8  RUQ(cm)       RLQ(cm)       LUQ(cm)        LLQ(cm)  3.7           4.4           3.3            4.8 ---------------------------------------------------------------------- BIOMETRY:  BPD:      82.2  mm     G. Age:  33w 1d         34  %    CI:         70.93  %    70 - 86                                                          FL/HC:       20.3  %    19.9 - 21.5  HC:        311  mm     G. Age:  34w 6d         48  %    HC/AC:       1.05       0.96 - 1.11  AC:      297.1  mm     G. Age:  33w 5d         60  %    FL/BPD:      76.8  %    71 - 87  FL:       63.1  mm     G. Age:  32w 5d         21  %    FL/AC:       21.2  %    20 - 24  Est. FW:    2196   g    4 lb 13 oz      44  %                     m ---------------------------------------------------------------------- GESTATIONAL AGE:  LMP:           35w 3d        Date:  04/23/19                 EDD:    01/28/20  U/S Today:  33w 4d                                        EDD:    02/10/20  Best:          33w 3d     Det. By:  Loman Chroman         EDD:    02/11/20                                       (07/19/19) ---------------------------------------------------------------------- ANATOMY:  Cavum:                 Visualized             Stomach:                Seen                         previously  Ventricles:            Normal appearance      Abdominal Wall:         Visualized                                                                        previously  Cerebellum:            Visualized             Cord Vessels:           3 vessels,                         previously                                     visualized previously  Posterior Fossa:       Visualized             Kidneys:                Normal appearance                         previously  Face:                  Orbits visualized      Bladder:                Seen                         previously  Lips:                  Visualized             Spine:                  Visualized  previously                                     previously  Heart:                 4-Chamber view         Upper Extremities:      Visualized                         appears normal                                 previously  RVOT:                  Normal appearance      Lower Extremities:      Visualized                                                                        previously  LVOT:                  Normal appearance ---------------------------------------------------------------------- IMPRESSION:  Known A2GDM and a BMI of 44  Normal interval growth with measurements consistent with  dates.  A biophysical profile of 8/8 was observed with good fetal  movement and amniotic fluid volume  In review of Ms. Crenshaw's blood sugar log >50% of her  FBS and  2hr pp breakfast readings were elevated. <25% of  her 2hr pp lunch and dinner were abnormal. She is not active  and in review of her most recent meals we discussed  adjustments that would improve her fasting blood sugar. She  notes that her most recent insulin  regimen is NPH am/pm  34/54 Humalog- 03/30/09.  He blood pressure is normal at 129/74 today.  She reports good fetal activity. ---------------------------------------------------------------------- RECOMMENDATIONS:  Continue weekly testing  Consider delivery between 37-39 weeks, closer to 37 weeks  for poor blood glucose control or poor fetal testing.  HbA1c to assess overall average blood sugars.  Consider increasing am/pm NPH if blood sugars aren't  improved in 3 days. ----------------------------------------------------------------------               Sander Nephew, MD Electronically Signed Final Report   12/27/2019 03:45 pm ----------------------------------------------------------------------    Assessment & Plan:   Severe persistent asthma without complication - Patient is [redacted] weeks pregnant, she has inspiratory wheezing to lung fields on exam. Not currently on ICS/LABA - Goal is to control asthma symptoms prior to delivery to prevent exacerbation or respiratory distress. Preferred steroid inhaler in pregnancy is Pulmicort (budesonide) - Recommend patient start Pulmicort flexhaler 19mg 2 puffs twice daily. We need to send in prior auth as this is not covered by her insurance - She can resume Advair and Albuterol after delivery, safe to take while breastfeeding   GERD (gastroesophageal reflux disease) - Patient reports GERD symptoms and wheezing at night - Recommend starting Pepcid (famotidine) 279mat bedtime (safe to take while pregnant)  Discussed above with Dr. GoPatsey BertholdU in 6-8 weeks after delivery  ElBenjamine Mola  Teresita Madura, NP 01/22/2020

## 2020-01-21 NOTE — Patient Instructions (Addendum)
Asthma in Pregnancy: - You can use pepcid 20mg  at bedtime for reflux (safe to take while pregnant) - We will send in inhaler called PULMICORT - take this twice a day no matter how you feel for asthma symptoms (safe to use while pregnant) - You can resume Advair and Albuterol after delivery- safe to use when breast feeing  - Caution Singulair use during breastfeeding, no known risk infant harm based on limited human data  Follow-up: - 6-8 weeks after delivery with Dr. 

## 2020-01-22 ENCOUNTER — Telehealth: Payer: Self-pay

## 2020-01-22 ENCOUNTER — Encounter: Payer: Self-pay | Admitting: Primary Care

## 2020-01-22 DIAGNOSIS — K219 Gastro-esophageal reflux disease without esophagitis: Secondary | ICD-10-CM | POA: Insufficient documentation

## 2020-01-22 DIAGNOSIS — J4550 Severe persistent asthma, uncomplicated: Secondary | ICD-10-CM

## 2020-01-22 NOTE — Assessment & Plan Note (Signed)
-   Patient reports GERD symptoms and wheezing at night - Recommend starting Pepcid (famotidine) 20mg  at bedtime (safe to take while pregnant)

## 2020-01-22 NOTE — Telephone Encounter (Signed)
PA for pulmicort has been started via telephone with [place] at [place].  Determination will be faxed to our office within 72hr.

## 2020-01-22 NOTE — Assessment & Plan Note (Addendum)
-   Patient is [redacted] weeks pregnant, she has inspiratory wheezing to lung fields on exam. Not currently on ICS/LABA - Goal is to control asthma symptoms prior to delivery to prevent exacerbation or respiratory distress. Preferred steroid inhaler in pregnancy is Pulmicort (budesonide) - Recommend patient start Pulmicort flexhaler 2 puffs twice daily. We need to send in prior auth as this is not covered by her insurance - She can resume Advair and Albuterol after delivery, safe to take while breastfeeding

## 2020-01-23 ENCOUNTER — Other Ambulatory Visit: Payer: Self-pay

## 2020-01-23 ENCOUNTER — Ambulatory Visit (INDEPENDENT_AMBULATORY_CARE_PROVIDER_SITE_OTHER): Payer: Managed Care, Other (non HMO) | Admitting: Obstetrics & Gynecology

## 2020-01-23 VITALS — BP 110/60 | Wt 241.0 lb

## 2020-01-23 DIAGNOSIS — O0993 Supervision of high risk pregnancy, unspecified, third trimester: Secondary | ICD-10-CM | POA: Diagnosis not present

## 2020-01-23 DIAGNOSIS — O99213 Obesity complicating pregnancy, third trimester: Secondary | ICD-10-CM

## 2020-01-23 DIAGNOSIS — J4550 Severe persistent asthma, uncomplicated: Secondary | ICD-10-CM

## 2020-01-23 DIAGNOSIS — Z98891 History of uterine scar from previous surgery: Secondary | ICD-10-CM

## 2020-01-23 DIAGNOSIS — Z3A37 37 weeks gestation of pregnancy: Secondary | ICD-10-CM

## 2020-01-23 DIAGNOSIS — O99210 Obesity complicating pregnancy, unspecified trimester: Secondary | ICD-10-CM

## 2020-01-23 NOTE — Progress Notes (Signed)
Subjective  Fetal Movement? yes Contractions? no Leaking Fluid? no Vaginal Bleeding? no  Objective  BP 110/60   Wt 241 lb (109.3 kg)   LMP 04/23/2019   BMI 42.69 kg/m  General: NAD Pumonary: no increased work of breathing Abdomen: gravid, non-tender Extremities: no edema Psychiatric: mood appropriate, affect full  A NST procedure was performed with FHR monitoring and a normal baseline established, appropriate time of 20-40 minutes of evaluation, and accels >2 seen w 15x15 characteristics.  Results show a REACTIVE NST.   Assessment  34 y.o. [id number] at [redacted]w[redacted]d by  02/11/2020, by Ultrasound presenting for routine prenatal visit  Plan   Problem List Items Addressed This Visit      Respiratory   Severe persistent asthma without complication     Other   Obesity affecting pregnancy, antepartum   Supervision of high risk pregnancy, antepartum   History of 2 cesarean sections    Other Visit Diagnoses    [redacted] weeks gestation of pregnancy    -  Primary      pregnancy Problems (from 04/23/19 to present)    Problem Noted Resolved   Severe persistent asthma without complication 11/14/2019 by Natale Milch, MD No   Overview Addendum 12/15/2019  1:50 AM by Natale Milch, MD    11/14/2019- Advair 250/50 2 puffs BID initiated, pulmonology referral made, advised patient to purchase peak flow meter 12/15/2019- Patient reports her asthma is markedly improved with Advair usage.       Previous Version   BMI 39.0-39.9,adult 07/19/2019 by Conard Novak, MD No   History of 2 cesarean sections 07/10/2019 by Nadara Mustard, MD No   Overview Addendum 11/14/2019  9:32 PM by Natale Milch, MD    Repeat cesarean scheduled [redacted] wk gestation 02/06/2020      Previous Version   Obesity affecting pregnancy, antepartum 11/19/2017 by Tresea Mall, CNM No   Overview Signed 07/10/2019  2:47 PM by Nadara Mustard, MD    BMI >=40 [ ]  early 1h gtt -  [ ]  u/s for dating [ ]   [ ]   nutritional goals [ ]  folic acid 1mg  [ ]  bASA (>12 weeks) [ ]  consider nutrition consult [ ]  consider maternal EKG 1st trimester [ ]  Growth u/s 28 [ ] , 32 [ ] , 36 weeks [ ]  [ ]  NST/AFI weekly 36+ weeks (36[] , 37[] , 38[] )       Supervision of high risk pregnancy, antepartum 11/19/2017 by , CNM No   Overview Addendum 08/15/2019  2:01 PM by , MD    [facility]inic [facility] Prenatal Labs  Dating 10 week Blood type: A/Positive/-- (01/19 1452)   Genetic Screen  NIPS: XX Antibody:Negative (01/19 1452)  Anatomic  Rubella: 10.70 (01/19 1452) Varicella: Imm  GTT Early: 147 3 hr GTT 103  / 189 / 160 /149 RPR: Non Reactive (01/19 1452)   Rhogam N/A HBsAg: Negative (01/19 1452)   TDaP vaccine          Flu Shot:03/2019 HIV: Non Reactive (01/19 1452)   Baby Food     Breast                           GBS:   Contraception  Pap:07/10/19  CBB  No   CS/VBAC CS x2, plans CS   Support Person Husband Montegus            PNV, [facility], labor precautions  Plans CS BTL  next week by Dr Bjorn Pippin, sooner if labors  Keep BS under control.  MFM adjusting, no recent adjustments as has had good control   CS expectations, recovery discussed  Annamarie Major, MD, [facility], [facility] 01/23/2020  8:54 AM

## 2020-01-24 ENCOUNTER — Ambulatory Visit (HOSPITAL_BASED_OUTPATIENT_CLINIC_OR_DEPARTMENT_OTHER): Payer: Managed Care, Other (non HMO)

## 2020-01-24 ENCOUNTER — Encounter
Admission: RE | Admit: 2020-01-24 | Discharge: 2020-01-24 | Disposition: A | Discharge status: Home / Self Care | Payer: Managed Care, Other (non HMO) | Source: Ambulatory Visit | Attending: Obstetrics and Gynecology | Admitting: Obstetrics and Gynecology

## 2020-01-24 ENCOUNTER — Other Ambulatory Visit: Payer: Self-pay

## 2020-01-24 DIAGNOSIS — Z3A37 37 weeks gestation of pregnancy: Secondary | ICD-10-CM | POA: Insufficient documentation

## 2020-01-24 DIAGNOSIS — E669 Obesity, unspecified: Secondary | ICD-10-CM | POA: Insufficient documentation

## 2020-01-24 DIAGNOSIS — O99210 Obesity complicating pregnancy, unspecified trimester: Secondary | ICD-10-CM | POA: Diagnosis not present

## 2020-01-24 DIAGNOSIS — O99213 Obesity complicating pregnancy, third trimester: Secondary | ICD-10-CM | POA: Diagnosis not present

## 2020-01-24 DIAGNOSIS — O24113 Pre-existing diabetes mellitus, type 2, in pregnancy, third trimester: Secondary | ICD-10-CM | POA: Diagnosis not present

## 2020-01-24 DIAGNOSIS — O24913 Unspecified diabetes mellitus in pregnancy, third trimester: Secondary | ICD-10-CM

## 2020-01-24 HISTORY — DX: Gastro-esophageal reflux disease without esophagitis: K21.9

## 2020-01-24 HISTORY — DX: Anxiety disorder, unspecified: F41.9

## 2020-01-24 NOTE — Patient Instructions (Signed)
Your procedure is scheduled on: 01/29/20 Report to [facility] [phone]) Enter through the Emergency Department.  Remember: Instructions that are not followed completely may result in serious medical risk, up to and including death, or upon the discretion of your surgeon and anesthesiologist your surgery may need to be rescheduled.     _X__ 1. Do not eat food after midnight the night before your procedure.                 No gum chewing or hard candies. You may drink clear liquids up to 2 hours                 before you are scheduled to arrive for your surgery- DO not drink clear                 liquids within 2 hours of the start of your surgery.                 Clear Liquids include:  water, apple juice without pulp, clear carbohydrate                 drink such as Clearfast or Gatorade, Black Coffee or Tea (Do not add                 anything to coffee or tea). Diabetics water only  __X__2.  On the morning of surgery brush your teeth with toothpaste and water, you                 may rinse your mouth with mouthwash if you wish.  Do not swallow any              toothpaste of mouthwash.     _X__ 3.  No Alcohol for 24 hours before or after surgery.   _X__ 4.  Do Not Smoke or use e-cigarettes For 24 Hours Prior to Your Surgery.                 Do not use any chewable tobacco products for at least 6 hours prior to                 surgery.  ____  5.  Bring all medications with you on the day of surgery if instructed.   __X__  6.  Notify your doctor if there is any change in your medical condition      (cold, fever, infections).     Do not wear jewelry, make-up, hairpins, clips or nail polish. Do not wear lotions, powders, or perfumes.  Do not shave 48 hours prior to surgery. Men may shave face and neck. Do not bring valuables to the hospital.    [facility] is not responsible for any belongings or valuables.  Contacts, dentures/partials or body piercings may not be worn into  surgery. Bring a case for your contacts, glasses or hearing aids, a denture cup will be supplied. Leave your suitcase in the car. After surgery it may be brought to your room. For patients admitted to the hospital, discharge time is determined by your treatment team.   Patients discharged the day of surgery will not be allowed to drive home.   Please read over the following fact sheets that you were given:   MRSA Information  __X__ Take these medicines the morning of surgery with A SIP OF WATER:    1. sertraline (ZOLOFT) 50 MG tablet  2. montelukast (SINGULAIR) 10 MG tablet  3. famotidine (PEPCID)  20 MG tablet  4.  5.  6.  ____ Fleet Enema (as directed)   __X__ Use CHG Soap/SAGE wipes as directed  __X__ Use inhalers on the day of surgery  ____ Stop metformin/Janumet/Farxiga 2 days prior to surgery    ____ Take 1/2 of usual insulin dose the night before surgery. No insulin the morning          of surgery.   ____ Stop Blood Thinners Coumadin/Plavix/Xarelto/Pleta/Pradaxa/Eliquis/Effient/Aspirin  on   Or contact your Surgeon, Cardiologist or Medical Doctor regarding  ability to stop your blood thinners  __X__ Stop Anti-inflammatories 7 days before surgery such as Advil, Ibuprofen, Motrin,  BC or Goodies Powder, Naprosyn, Naproxen, Aleve, Aspirin    __X__ Stop all herbal supplements, fish oil or vitamin E until after surgery.    ____ Bring C-Pap to the hospital.

## 2020-01-25 ENCOUNTER — Other Ambulatory Visit: Payer: Self-pay | Admitting: Certified Nurse Midwife

## 2020-01-25 ENCOUNTER — Ambulatory Visit
Admission: RE | Admit: 2020-01-25 | Discharge: 2020-01-25 | Disposition: A | Discharge status: Home / Self Care | Payer: Managed Care, Other (non HMO) | Source: Ambulatory Visit | Attending: Obstetrics and Gynecology | Admitting: Obstetrics and Gynecology

## 2020-01-25 DIAGNOSIS — E010 Iodine-deficiency related diffuse (endemic) goiter: Secondary | ICD-10-CM | POA: Insufficient documentation

## 2020-01-25 MED ORDER — BUDESONIDE 0.25 MG/2ML IN SUSP
0.2500 mg | Freq: Two times a day (BID) | RESPIRATORY_TRACT | 2 refills | Status: DC
Start: 2020-01-25 — End: 2023-11-12

## 2020-01-25 NOTE — Telephone Encounter (Signed)
Spoke to [place] with [facility], who states that Pulmicort is covered however it is 106 dollars.  I have spoken to pt, who stated that this is not affordable.  Per research Pulmicort nebulizer solution is 36 dollars at [employer] with [facility]. This is affordable for pt.  Per DR. Jayme Cloud verbally- okay to switch to Pulmicort 0.25 BID and prescribe neb machine. Medication and order for nebulizer has been sent.  Nothing further is needed at thsi time.

## 2020-01-28 ENCOUNTER — Other Ambulatory Visit: Payer: Self-pay

## 2020-01-28 ENCOUNTER — Other Ambulatory Visit
Admission: RE | Admit: 2020-01-28 | Discharge: 2020-01-28 | Disposition: A | Discharge status: Home / Self Care | Payer: Managed Care, Other (non HMO) | Source: Ambulatory Visit | Attending: Obstetrics and Gynecology | Admitting: Obstetrics and Gynecology

## 2020-01-28 ENCOUNTER — Encounter: Payer: Managed Care, Other (non HMO) | Admitting: Obstetrics and Gynecology

## 2020-01-28 DIAGNOSIS — Z01812 Encounter for preprocedural laboratory examination: Secondary | ICD-10-CM | POA: Insufficient documentation

## 2020-01-28 DIAGNOSIS — Z20822 Contact with and (suspected) exposure to covid-19: Secondary | ICD-10-CM | POA: Insufficient documentation

## 2020-01-28 LAB — COMPREHENSIVE METABOLIC PANEL
ALT: 9 U/L (ref 0–44)
AST: 14 U/L — ABNORMAL LOW (ref 15–41)
Albumin: 2.7 g/dL — ABNORMAL LOW (ref 3.5–5.0)
Alkaline Phosphatase: 97 U/L (ref 38–126)
Anion gap: 9 (ref 5–15)
BUN: 10 mg/dL (ref 6–20)
CO2: 20 mmol/L — ABNORMAL LOW (ref 22–32)
Calcium: 8.5 mg/dL — ABNORMAL LOW (ref 8.9–10.3)
Chloride: 106 mmol/L (ref 98–111)
Creatinine, Ser: 0.7 mg/dL (ref 0.44–1.00)
GFR calc Af Amer: >60 mL/min (ref >60)
GFR calc non Af Amer: >60 mL/min (ref >60)
Glucose, Bld: 75 mg/dL (ref 70–99)
Potassium: 3.6 mmol/L (ref 3.5–5.1)
Sodium: 135 mmol/L (ref 135–145)
Total Bilirubin: 0.6 mg/dL (ref 0.3–1.2)
Total Protein: 6.8 g/dL (ref 6.5–8.1)

## 2020-01-28 LAB — RAPID HIV SCREEN (HIV 1/2 AB+AG)
HIV 1/2 Antibodies: NONREACTIVE
HIV-1 P24 Antigen - HIV24: NONREACTIVE

## 2020-01-28 LAB — TYPE AND SCREEN
ABO/RH(D): A POS
Antibody Screen: NEGATIVE
Extend sample reason: UNDETERMINED

## 2020-01-28 LAB — SARS CORONAVIRUS 2 (TAT 6-24 HRS): SARS Coronavirus 2: NEGATIVE

## 2020-01-29 ENCOUNTER — Inpatient Hospital Stay: Payer: Managed Care, Other (non HMO) | Admitting: Anesthesiology

## 2020-01-29 ENCOUNTER — Encounter
Admission: RE | Disposition: A | Discharge status: Home / Self Care | Payer: Self-pay | Source: Home / Self Care | Attending: Obstetrics and Gynecology

## 2020-01-29 ENCOUNTER — Other Ambulatory Visit: Payer: Self-pay

## 2020-01-29 ENCOUNTER — Encounter: Payer: Self-pay | Admitting: Obstetrics and Gynecology

## 2020-01-29 ENCOUNTER — Inpatient Hospital Stay
Admission: RE | Admit: 2020-01-29 | Discharge: 2020-01-31 | DRG: 785 | Disposition: A | Discharge status: Home / Self Care | Payer: Managed Care, Other (non HMO) | Attending: Obstetrics and Gynecology | Admitting: Obstetrics and Gynecology

## 2020-01-29 DIAGNOSIS — O24424 Gestational diabetes mellitus in childbirth, insulin controlled: Secondary | ICD-10-CM | POA: Diagnosis present

## 2020-01-29 DIAGNOSIS — Z20822 Contact with and (suspected) exposure to covid-19: Secondary | ICD-10-CM | POA: Diagnosis present

## 2020-01-29 DIAGNOSIS — Z302 Encounter for sterilization: Secondary | ICD-10-CM | POA: Diagnosis not present

## 2020-01-29 DIAGNOSIS — O9952 Diseases of the respiratory system complicating childbirth: Secondary | ICD-10-CM | POA: Diagnosis present

## 2020-01-29 DIAGNOSIS — O24414 Gestational diabetes mellitus in pregnancy, insulin controlled: Secondary | ICD-10-CM | POA: Diagnosis not present

## 2020-01-29 DIAGNOSIS — O34211 Maternal care for low transverse scar from previous cesarean delivery: Secondary | ICD-10-CM | POA: Diagnosis present

## 2020-01-29 DIAGNOSIS — O99214 Obesity complicating childbirth: Secondary | ICD-10-CM | POA: Diagnosis present

## 2020-01-29 DIAGNOSIS — Z3A38 38 weeks gestation of pregnancy: Secondary | ICD-10-CM | POA: Diagnosis not present

## 2020-01-29 DIAGNOSIS — O0993 Supervision of high risk pregnancy, unspecified, third trimester: Secondary | ICD-10-CM

## 2020-01-29 DIAGNOSIS — J4550 Severe persistent asthma, uncomplicated: Secondary | ICD-10-CM | POA: Diagnosis present

## 2020-01-29 LAB — CBC
HCT: 32.9 % — ABNORMAL LOW (ref 36.0–46.0)
Hemoglobin: 10.7 g/dL — ABNORMAL LOW (ref 12.0–15.0)
MCH: 25.9 pg — ABNORMAL LOW (ref 26.0–34.0)
MCHC: 32.5 g/dL (ref 30.0–36.0)
MCV: 79.7 fL — ABNORMAL LOW (ref 80.0–100.0)
Platelets: 189 10*3/uL (ref 150–400)
RBC: 4.13 MIL/uL (ref 3.87–5.11)
RDW: 16.1 % — ABNORMAL HIGH (ref 11.5–15.5)
WBC: 11.4 10*3/uL — ABNORMAL HIGH (ref 4.0–10.5)
nRBC: 0 % (ref 0.0–0.2)

## 2020-01-29 LAB — GLUCOSE, CAPILLARY
Glucose-Capillary: 106 mg/dL — ABNORMAL HIGH (ref 70–99)
Glucose-Capillary: 107 mg/dL — ABNORMAL HIGH (ref 70–99)
Glucose-Capillary: 85 mg/dL (ref 70–99)
Glucose-Capillary: 94 mg/dL (ref 70–99)
Glucose-Capillary: 95 mg/dL (ref 70–99)
Glucose-Capillary: 98 mg/dL (ref 70–99)

## 2020-01-29 LAB — RPR: RPR Ser Ql: NONREACTIVE

## 2020-01-29 SURGERY — Surgical Case
Anesthesia: Spinal | Laterality: Bilateral

## 2020-01-29 MED ORDER — MORPHINE SULFATE (PF) 0.5 MG/ML IJ SOLN
INTRAMUSCULAR | Status: DC | PRN
  Administered 2020-01-29: .1 mg via INTRATHECAL

## 2020-01-29 MED ORDER — DIPHENHYDRAMINE HCL 25 MG PO CAPS: 25.0000 mg | ORAL_CAPSULE | ORAL | Status: DC | PRN

## 2020-01-29 MED ORDER — SERTRALINE HCL 25 MG PO TABS
50.0000 mg | ORAL_TABLET | Freq: Every day | ORAL | Status: DC
  Administered 2020-01-30 – 2020-01-31 (×2): 50 mg via ORAL
  Filled 2020-01-29: qty 1
  Filled 2020-01-29: qty 2

## 2020-01-29 MED ORDER — ONDANSETRON HCL 4 MG/2ML IJ SOLN: 4.0000 mg | Freq: Three times a day (TID) | INTRAMUSCULAR | Status: DC | PRN

## 2020-01-29 MED ORDER — FENTANYL CITRATE (PF) 100 MCG/2ML IJ SOLN: 25.0000 ug | INTRAMUSCULAR | Status: DC | PRN

## 2020-01-29 MED ORDER — BUPIVACAINE 0.25 % ON-Q PUMP DUAL CATH 400 ML
400.0000 mL | INJECTION | Status: DC
  Filled 2020-01-29: qty 400

## 2020-01-29 MED ORDER — CEFAZOLIN SODIUM-DEXTROSE 2-4 GM/100ML-% IV SOLN
2.0000 g | INTRAVENOUS | Status: AC
  Administered 2020-01-29: 2 g via INTRAVENOUS
  Filled 2020-01-29: qty 100

## 2020-01-29 MED ORDER — ONDANSETRON HCL 4 MG/2ML IJ SOLN
INTRAMUSCULAR | Status: AC
  Filled 2020-01-29: qty 2

## 2020-01-29 MED ORDER — DIPHENHYDRAMINE HCL 25 MG PO CAPS: 25.0000 mg | ORAL_CAPSULE | Freq: Four times a day (QID) | ORAL | Status: DC | PRN

## 2020-01-29 MED ORDER — BUDESONIDE 0.25 MG/2ML IN SUSP
0.2500 mg | Freq: Two times a day (BID) | RESPIRATORY_TRACT | Status: DC
  Administered 2020-01-30: 0.25 mg via RESPIRATORY_TRACT
  Filled 2020-01-29 (×6): qty 2

## 2020-01-29 MED ORDER — WITCH HAZEL-GLYCERIN EX PADS: 1.0000 "application " | MEDICATED_PAD | CUTANEOUS | Status: DC | PRN

## 2020-01-29 MED ORDER — ACETAMINOPHEN 500 MG PO TABS
1000.0000 mg | ORAL_TABLET | Freq: Four times a day (QID) | ORAL | Status: AC
  Administered 2020-01-29 – 2020-01-30 (×4): 1000 mg via ORAL
  Filled 2020-01-29 (×4): qty 2

## 2020-01-29 MED ORDER — SODIUM CHLORIDE 0.9% FLUSH: 3.0000 mL | INTRAVENOUS | Status: DC | PRN

## 2020-01-29 MED ORDER — FLEET ENEMA 7-19 GM/118ML RE ENEM: 1.0000 | ENEMA | Freq: Every day | RECTAL | Status: DC | PRN

## 2020-01-29 MED ORDER — IBUPROFEN 800 MG PO TABS
800.0000 mg | ORAL_TABLET | Freq: Three times a day (TID) | ORAL | Status: DC
  Administered 2020-01-29 – 2020-01-31 (×5): 800 mg via ORAL
  Filled 2020-01-29 (×6): qty 1

## 2020-01-29 MED ORDER — PHENYLEPHRINE HCL (PRESSORS) 10 MG/ML IV SOLN
INTRAVENOUS | Status: AC
  Filled 2020-01-29: qty 1

## 2020-01-29 MED ORDER — DIPHENHYDRAMINE HCL 50 MG/ML IJ SOLN: 12.5000 mg | INTRAMUSCULAR | Status: DC | PRN

## 2020-01-29 MED ORDER — HYDROMORPHONE HCL 1 MG/ML IJ SOLN: 0.2000 mg | INTRAMUSCULAR | Status: DC | PRN

## 2020-01-29 MED ORDER — KETOROLAC TROMETHAMINE 30 MG/ML IJ SOLN: 30.0000 mg | Freq: Four times a day (QID) | INTRAMUSCULAR | Status: DC | PRN

## 2020-01-29 MED ORDER — ONDANSETRON HCL 4 MG/2ML IJ SOLN: 4.0000 mg | Freq: Once | INTRAMUSCULAR | Status: DC | PRN

## 2020-01-29 MED ORDER — SIMETHICONE 80 MG PO CHEW
80.0000 mg | CHEWABLE_TABLET | Freq: Three times a day (TID) | ORAL | Status: DC
  Administered 2020-01-29 – 2020-01-31 (×7): 80 mg via ORAL
  Filled 2020-01-29 (×7): qty 1

## 2020-01-29 MED ORDER — OXYTOCIN-SODIUM CHLORIDE 30-0.9 UT/500ML-% IV SOLN
INTRAVENOUS | Status: AC
  Filled 2020-01-29: qty 1000

## 2020-01-29 MED ORDER — OXYTOCIN-SODIUM CHLORIDE 30-0.9 UT/500ML-% IV SOLN
INTRAVENOUS | Status: DC | PRN
  Administered 2020-01-29 (×2): 30 [IU] via INTRAVENOUS

## 2020-01-29 MED ORDER — NALBUPHINE HCL 10 MG/ML IJ SOLN: 5.0000 mg | Freq: Once | INTRAMUSCULAR | Status: DC | PRN

## 2020-01-29 MED ORDER — MEPERIDINE HCL 25 MG/ML IJ SOLN: 6.2500 mg | INTRAMUSCULAR | Status: DC | PRN

## 2020-01-29 MED ORDER — ENOXAPARIN SODIUM 40 MG/0.4ML ~~LOC~~ SOLN
40.0000 mg | SUBCUTANEOUS | Status: DC
  Administered 2020-01-30 – 2020-01-31 (×2): 40 mg via SUBCUTANEOUS
  Filled 2020-01-29 (×2): qty 0.4

## 2020-01-29 MED ORDER — SIMETHICONE 80 MG PO CHEW: 80.0000 mg | CHEWABLE_TABLET | ORAL | Status: DC | PRN

## 2020-01-29 MED ORDER — SENNOSIDES-DOCUSATE SODIUM 8.6-50 MG PO TABS
2.0000 | ORAL_TABLET | ORAL | Status: DC
  Administered 2020-01-30 – 2020-01-31 (×2): 2 via ORAL
  Filled 2020-01-29 (×2): qty 2

## 2020-01-29 MED ORDER — MONTELUKAST SODIUM 10 MG PO TABS
10.0000 mg | ORAL_TABLET | Freq: Every day | ORAL | Status: DC
  Administered 2020-01-30 – 2020-01-31 (×2): 10 mg via ORAL
  Filled 2020-01-29 (×3): qty 1

## 2020-01-29 MED ORDER — MORPHINE SULFATE (PF) 0.5 MG/ML IJ SOLN
INTRAMUSCULAR | Status: AC
  Filled 2020-01-29: qty 10

## 2020-01-29 MED ORDER — LACTATED RINGERS IV SOLN: INTRAVENOUS | Status: DC

## 2020-01-29 MED ORDER — BISACODYL 10 MG RE SUPP: 10.0000 mg | Freq: Every day | RECTAL | Status: DC | PRN

## 2020-01-29 MED ORDER — FAMOTIDINE 20 MG PO TABS
20.0000 mg | ORAL_TABLET | Freq: Every day | ORAL | Status: DC
  Administered 2020-01-29 – 2020-01-30 (×2): 20 mg via ORAL
  Filled 2020-01-29 (×2): qty 1

## 2020-01-29 MED ORDER — NALOXONE HCL 4 MG/10ML IJ SOLN
1.0000 ug/kg/h | INTRAVENOUS | Status: DC | PRN
  Filled 2020-01-29: qty 5

## 2020-01-29 MED ORDER — BUPIVACAINE IN DEXTROSE 0.75-8.25 % IT SOLN
INTRATHECAL | Status: DC | PRN
  Administered 2020-01-29: 1.6 mL via INTRATHECAL

## 2020-01-29 MED ORDER — SODIUM CHLORIDE 0.9 % IV SOLN
INTRAVENOUS | Status: DC | PRN
  Administered 2020-01-29: 50 ug/min via INTRAVENOUS

## 2020-01-29 MED ORDER — ZOLPIDEM TARTRATE 5 MG PO TABS: 5.0000 mg | ORAL_TABLET | Freq: Every evening | ORAL | Status: DC | PRN

## 2020-01-29 MED ORDER — INSULIN ASPART 100 UNIT/ML ~~LOC~~ SOLN: 0.0000 [IU] | SUBCUTANEOUS | Status: DC

## 2020-01-29 MED ORDER — DIBUCAINE (PERIANAL) 1 % EX OINT: 1.0000 "application " | TOPICAL_OINTMENT | CUTANEOUS | Status: DC | PRN

## 2020-01-29 MED ORDER — OXYCODONE HCL 5 MG PO TABS
5.0000 mg | ORAL_TABLET | ORAL | Status: DC | PRN
  Administered 2020-01-30: 5 mg via ORAL
  Filled 2020-01-29: qty 1

## 2020-01-29 MED ORDER — NALBUPHINE HCL 10 MG/ML IJ SOLN: 5.0000 mg | INTRAMUSCULAR | Status: DC | PRN

## 2020-01-29 MED ORDER — FERROUS SULFATE 325 (65 FE) MG PO TABS: 325.0000 mg | ORAL_TABLET | Freq: Two times a day (BID) | ORAL | Status: DC

## 2020-01-29 MED ORDER — SOD CITRATE-CITRIC ACID 500-334 MG/5ML PO SOLN
30.0000 mL | ORAL | Status: AC
  Administered 2020-01-29: 30 mL via ORAL
  Filled 2020-01-29: qty 30

## 2020-01-29 MED ORDER — ONDANSETRON HCL 4 MG/2ML IJ SOLN
INTRAMUSCULAR | Status: DC | PRN
  Administered 2020-01-29: 4 mg via INTRAVENOUS

## 2020-01-29 MED ORDER — PRENATAL MULTIVITAMIN CH
1.0000 | ORAL_TABLET | Freq: Every day | ORAL | Status: DC
  Administered 2020-01-29 – 2020-01-31 (×3): 1 via ORAL
  Filled 2020-01-29 (×3): qty 1

## 2020-01-29 MED ORDER — ALBUTEROL SULFATE (2.5 MG/3ML) 0.083% IN NEBU: 2.5000 mg | INHALATION_SOLUTION | RESPIRATORY_TRACT | Status: DC | PRN

## 2020-01-29 MED ORDER — COCONUT OIL OIL: 1.0000 "application " | TOPICAL_OIL | Status: DC | PRN

## 2020-01-29 MED ORDER — NALOXONE HCL 0.4 MG/ML IJ SOLN: 0.4000 mg | INTRAMUSCULAR | Status: DC | PRN

## 2020-01-29 MED ORDER — BUPIVACAINE HCL (PF) 0.5 % IJ SOLN
INTRAMUSCULAR | Status: AC
  Filled 2020-01-29: qty 30

## 2020-01-29 MED ORDER — BUPIVACAINE ON-Q PAIN PUMP (FOR ORDER SET NO CHG)
INJECTION | Status: DC
  Filled 2020-01-29: qty 1

## 2020-01-29 MED ORDER — FENTANYL CITRATE (PF) 100 MCG/2ML IJ SOLN
INTRAMUSCULAR | Status: AC
  Filled 2020-01-29: qty 2

## 2020-01-29 MED ORDER — BUPIVACAINE HCL (PF) 0.5 % IJ SOLN
INTRAMUSCULAR | Status: DC | PRN
  Administered 2020-01-29: 10 mL

## 2020-01-29 MED ORDER — KETOROLAC TROMETHAMINE 30 MG/ML IJ SOLN
30.0000 mg | Freq: Four times a day (QID) | INTRAMUSCULAR | Status: DC | PRN
  Administered 2020-01-29 (×2): 30 mg via INTRAVENOUS
  Filled 2020-01-29 (×2): qty 1

## 2020-01-29 MED ORDER — OXYTOCIN-SODIUM CHLORIDE 30-0.9 UT/500ML-% IV SOLN
2.5000 [IU]/h | INTRAVENOUS | Status: DC
  Administered 2020-01-29 (×2): 2.5 [IU]/h via INTRAVENOUS
  Filled 2020-01-29: qty 500

## 2020-01-29 MED ORDER — SIMETHICONE 80 MG PO CHEW: 80.0000 mg | CHEWABLE_TABLET | ORAL | Status: DC

## 2020-01-29 MED ORDER — FENTANYL CITRATE (PF) 100 MCG/2ML IJ SOLN
INTRAMUSCULAR | Status: DC | PRN
  Administered 2020-01-29: 20 ug via INTRATHECAL

## 2020-01-29 MED ORDER — LACTATED RINGERS IV SOLN: Freq: Once | INTRAVENOUS | Status: DC

## 2020-01-29 MED ORDER — MENTHOL 3 MG MT LOZG
1.0000 | LOZENGE | OROMUCOSAL | Status: DC | PRN
  Filled 2020-01-29: qty 9

## 2020-01-29 MED ORDER — FERROUS SULFATE 325 (65 FE) MG PO TABS
325.0000 mg | ORAL_TABLET | Freq: Two times a day (BID) | ORAL | Status: DC
  Administered 2020-01-29 – 2020-01-31 (×4): 325 mg via ORAL
  Filled 2020-01-29 (×4): qty 1

## 2020-01-29 SURGICAL SUPPLY — 25 items
CANISTER SUCT 3000ML PPV (MISCELLANEOUS) ×3 IMPLANT
CHLORAPREP W/TINT 26 (MISCELLANEOUS) ×6 IMPLANT
DERMABOND ADVANCED (GAUZE/BANDAGES/DRESSINGS) ×2
DERMABOND ADVANCED .7 DNX12 (GAUZE/BANDAGES/DRESSINGS) ×1 IMPLANT
DRSG OPSITE POSTOP 4X10 (GAUZE/BANDAGES/DRESSINGS) ×3 IMPLANT
ELECT CAUTERY BLADE 6.4 (BLADE) ×3 IMPLANT
ELECT REM PT RETURN 9FT ADLT (ELECTROSURGICAL) ×3
ELECTRODE REM PT RTRN 9FT ADLT (ELECTROSURGICAL) ×1 IMPLANT
EXTRACTOR VACUUM KIWI (MISCELLANEOUS) ×3 IMPLANT
GLOVE BIOGEL PI IND STRL 6.5 (GLOVE) ×2 IMPLANT
GLOVE BIOGEL PI INDICATOR 6.5 (GLOVE) ×4
GOWN STRL REUS W/ TWL LRG LVL3 (GOWN DISPOSABLE) ×1 IMPLANT
GOWN STRL REUS W/ TWL XL LVL3 (GOWN DISPOSABLE) ×2 IMPLANT
GOWN STRL REUS W/TWL LRG LVL3 (GOWN DISPOSABLE) ×3
GOWN STRL REUS W/TWL XL LVL3 (GOWN DISPOSABLE) ×6
NS IRRIG 1000ML POUR BTL (IV SOLUTION) ×3 IMPLANT
PACK C SECTION (MISCELLANEOUS) ×3 IMPLANT
PAD OB MATERNITY 4.3X12.25 (PERSONAL CARE ITEMS) ×3 IMPLANT
PAD PREP 24X41 OB/GYN DISP (PERSONAL CARE ITEMS) ×3 IMPLANT
PENCIL SMOKE ULTRAEVAC 22 CON (MISCELLANEOUS) ×3 IMPLANT
SUT MNCRL AB 4-0 PS2 18 (SUTURE) ×3 IMPLANT
SUT PLAIN 3-0 (SUTURE) ×3 IMPLANT
SUT VIC AB 0 CT1 36 (SUTURE) ×9 IMPLANT
SUT VIC AB 2-0 CT1 36 (SUTURE) ×3 IMPLANT
SYR 30ML LL (SYRINGE) ×6 IMPLANT

## 2020-01-29 NOTE — H&P (Signed)
Shelby Aguilar is an 34 y.o. female.   Chief Complaint: History of prior cesarean, desires sterilization HPI: patient presents today for scheduled cesarean section secondary to history of prior cesarean and difficult to control type 2 diabetes. She desires sterilization. She has no other complaints.   pregnancy Problems (from 04/23/19 to present)    Problem Noted Resolved   Severe persistent asthma without complication 11/14/2019 by Natale Milch, MD No   Overview Addendum 12/15/2019  1:50 AM by Natale Milch, MD    11/14/2019- Advair 250/50 2 puffs BID initiated, pulmonology referral made, advised patient to purchase peak flow meter 12/15/2019- Patient reports her asthma is markedly improved with Advair usage.       Previous Version   BMI 39.0-39.9,adult 07/19/2019 by Conard Novak, MD No   History of 2 cesarean sections 07/10/2019 by Nadara Mustard, MD No   Overview Addendum 11/14/2019  9:32 PM by Natale Milch, MD    Repeat cesarean scheduled [redacted] wk gestation 02/06/2020      Previous Version   Obesity affecting pregnancy, antepartum 11/19/2017 by Tresea Mall, CNM No   Overview Signed 07/10/2019  2:47 PM by Nadara Mustard, MD    BMI >=40 [ ]  early 1h gtt -  [ ]  u/s for dating [ ]   [ ]  nutritional goals [ ]  folic acid 1mg  [ ]  bASA (>12 weeks) [ ]  consider nutrition consult [ ]  consider maternal EKG 1st trimester [ ]  Growth u/s 28 [ ] , 32 [ ] , 36 weeks [ ]  [ ]  NST/AFI weekly 36+ weeks (36[] , 37[] , 38[] )       Supervision of high risk pregnancy, antepartum 11/19/2017 by , CNM No   Overview Addendum 08/15/2019  2:01 PM by , MD    [facility] Prenatal Labs  Dating 10 week Blood type: A/Positive/-- (01/19 1452)   Genetic Screen  NIPS: XX Antibody:Negative (01/19 1452)  Anatomic  Rubella: 10.70 (01/19 1452) Varicella: Imm  GTT Early: 147 3 hr GTT 103  / 189 / 160 /149 RPR: Non Reactive (01/19 1452)   Rhogam N/A  HBsAg: Negative (01/19 1452)   TDaP vaccine          Flu Shot:03/2019 HIV: Non Reactive (01/19 1452)   Baby Food     Breast                           GBS:   Contraception  Pap:07/10/19  CBB  No   CS/VBAC CS x2, plans CS   Support Person Husband Montegus            Previous Version       Past Medical History:  Diagnosis Date  . Anxiety   . Asthma   . B12 deficiency   . Depression   . Family history of breast cancer    1/21 cancer genetic tesitng letter sent  . GERD (gastroesophageal reflux disease)   . Gestational diabetes   . Iron deficiency anemia   . PCOS (polycystic ovarian syndrome)     Past Surgical History:  Procedure Laterality Date  . CESAREAN SECTION  2013  . CESAREAN SECTION N/A 07/25/2018   Procedure: CESAREAN SECTION;  Surgeon: [place], MD;  Location: [facility] ORS;  Service: Obstetrics;  Laterality: N/A;    Family History  Problem Relation Age of Onset  . Breast cancer Paternal Grandmother 41  . Diabetes Father   .  Diabetes Sister    Social History:  reports that she has never smoked. She has never used smokeless tobacco. She reports previous alcohol use. She reports that she does not use drugs.  Allergies: No Known Allergies  Medications Prior to Admission  Medication Sig Dispense Refill  . budesonide (PULMICORT) 0.25 MG/2ML nebulizer solution Take 2 mLs (0.25 mg total) by nebulization 2 (two) times daily. 120 mL 2  . chlorhexidine (PERIDEX) 0.12 % solution SMARTSIG:By Mouth    . famotidine (PEPCID) 20 MG tablet Take 1 tablet (20 mg total) by mouth at bedtime. 30 tablet 0  . ferrous sulfate 325 (65 FE) MG tablet Take 325 mg by mouth 2 (two) times daily with a meal.     . insulin lispro (HUMALOG KWIKPEN) 100 UNIT/ML KwikPen Inject 0.1 mLs (10 Units total) into the skin 3 (three) times daily before meals. (Patient taking differently: Inject 12 Units into the skin 3 (three) times daily before meals. ) 15 mL 11  . Insulin NPH, Human,, Isophane,  (HUMULIN N) 100 UNIT/ML Kiwkpen 30 units in the morning, 50 units at night (Patient taking differently: Inject 37-56 Units into the skin See admin instructions. Inject 37 units into the skin in the morning and 56 units at night) 30 mL 11  . Insulin Pen Needle (BD PEN NEEDLE MICRO U/F) 32G X 6 MM MISC USE AS DIRECTED TO INJECT INSULIN 100 each 11  . Lancets (ONETOUCH ULTRASOFT) lancets Use as instructed 200 each 5  . montelukast (SINGULAIR) 10 MG tablet Take 1 tablet (10 mg total) by mouth daily. 30 tablet 2  . ONETOUCH VERIO test strip TEST BLOOD SUGAR 4 TIMES DAILY 100 strip 5  . Prenatal Vit-Fe Fumarate-FA (MULTIVITAMIN-PRENATAL) 27-0.8 MG TABS tablet Take 1 tablet by mouth daily.     . sertraline (ZOLOFT) 50 MG tablet TAKE 1 TABLET BY MOUTH EVERY DAY 90 tablet 1  . albuterol (VENTOLIN HFA) 108 (90 Base) MCG/ACT inhaler Inhale 2 puffs into the lungs every 4 (four) hours as needed for wheezing or shortness of breath. (Patient not taking: Reported on 01/29/2020) 6.7 g 2    Results for orders placed or performed during the hospital encounter of 01/29/20 (from the past 48 hour(s))  CBC     Status: Abnormal   Collection Time: 01/29/20  7:01 AM  Result Value Ref Range   WBC 11.4 (H) 4.0 - 10.5 K/uL   RBC 4.13 3.87 - 5.11 MIL/uL   Hemoglobin 10.7 (L) 12.0 - 15.0 g/dL   HCT 10.2 (L) 36 - 46 %   MCV 79.7 (L) 80.0 - 100.0 fL   MCH 25.9 (L) 26.0 - 34.0 pg   MCHC 32.5 30.0 - 36.0 g/dL   RDW 72.5 (H) 36.6 - 44.0 %   Platelets 189 150 - 400 K/uL   nRBC 0.0 0.0 - 0.2 %    Comment: Performed at [facility], [address] [place], [place] [address]  Glucose, capillary     Status: None   Collection Time: 01/29/20  7:05 AM  Result Value Ref Range   Glucose-Capillary 94 70 - 99 mg/dL    Comment: Glucose reference range applies only to samples taken after fasting for at least 8 hours.   No results found.  Review of Systems  Constitutional: Negative for chills and fever.  HENT: Negative  for congestion, hearing loss and sinus pain.   Respiratory: Negative for cough, shortness of breath and wheezing.   Cardiovascular: Negative for chest pain, palpitations  and leg swelling.  Gastrointestinal: Negative for abdominal pain, constipation, diarrhea, nausea and vomiting.  Genitourinary: Negative for dysuria, flank pain, frequency, hematuria and urgency.  Musculoskeletal: Negative for back pain.  Skin: Negative for rash.  Neurological: Negative for dizziness and headaches.  Psychiatric/Behavioral: Negative for suicidal ideas. The patient is not nervous/anxious.     Blood pressure 121/72, pulse 97, temperature 97.7 F (36.5 C), temperature source Oral, resp. rate 18, height 5\' 3"  (1.6 m), weight 109.3 kg, last menstrual period 04/23/2019, currently breastfeeding. Physical Exam Vitals and nursing note reviewed.  Constitutional:      Appearance: She is well-developed.  HENT:     Head: Normocephalic and atraumatic.  Cardiovascular:     Rate and Rhythm: Normal rate and regular rhythm.  Pulmonary:     Effort: Pulmonary effort is normal.     Breath sounds: Normal breath sounds.  Abdominal:     General: Bowel sounds are normal.     Palpations: Abdomen is soft.  Musculoskeletal:        General: Normal range of motion.  Skin:    General: Skin is warm and dry.  Neurological:     Mental Status: She is alert and oriented to person, place, and time.  Psychiatric:        Behavior: Behavior normal.        Thought Content: Thought content normal.        Judgment: Judgment normal.      Assessment/Plan 34 yo G3P2002 [redacted]w[redacted]d, high risk pregnancy complicated by pregestational diabetes 34 y.o. 34 at [redacted]w[redacted]d with history of prior cesarean section who desires an elective repeat cesarean section with bilateral tubal ligation for sterilization.   Discussed risks, benefits, and alternatives with the patient for the cesarean section and bilateral tubal ligation.  Discussed the risk of  infection bleeding and damage to surrounding pelvic tissues including but not limited to the uterus, fallopian tubes, ovaries, bowel, and bladder.  Discussed proper care for the incision postoperatively to avoid infection and signs and symptoms of infection to watch for.  Discussed possibility of an emergent blood transfusion for heavy blood loss.  Discussed risks associated with blood transfusion including transfusion reaction and chronic infections, or sepsis which could lead to death. Discussed that a tubal ligation is considered a permanent sterilization procedure. Reversal surgery can be a possibility but can be dangerous and associated with risks such as an ectopic pregnancy. Tubal ligation has a failure rate of 1/1,000 and if she is ever suspicious of pregnancy she should take a pregnancy test.  Patient gave consent for the procedure and blood transfusion if needed . Consent forms were completed.   [redacted]w[redacted]d, MD 01/29/2020, 7:21 AM

## 2020-01-29 NOTE — Progress Notes (Signed)
Agree with the details of the below by Ames Dura, NP.  Gailen Shelter, MD [facility]

## 2020-01-29 NOTE — Transfer of Care (Signed)
Immediate Anesthesia Transfer of Care Note  Patient: Shelby Aguilar  Procedure(s) Performed: CESAREAN SECTION WITH BILATERAL TUBAL LIGATION (Bilateral )  Patient Location: PACU  Anesthesia Type:Spinal  Level of Consciousness: awake, alert  and oriented  Airway & Oxygen Therapy: Patient Spontanous Breathing  Post-op Assessment: Report given to RN and Post -op Vital signs reviewed and stable  Post vital signs: Reviewed and stable  Last Vitals:  Vitals Value Taken Time  BP    Temp    Pulse 73 01/29/20 0927  Resp 15 01/29/20 0927  SpO2 98 % 01/29/20 0927  Vitals shown include unvalidated device data.  Last Pain:  Vitals:   01/29/20 0603  TempSrc: Oral  PainSc:          Complications: No complications documented.

## 2020-01-29 NOTE — Op Note (Addendum)
Cesarean Section Procedure Note 01/29/20  Pre-operative Diagnosis:  1. High risk pregnancy  2. History of prior cesarean section  3. [redacted] week gestation  4. Gestational diabetes, insulin control, difficult to control with increasing insulin demands  5. Desires sterilization Post-operative Diagnosis: same, delivered. Procedure: Repeat Low Transverse Cesarean Section and bilateral tubal ligation Surgeon: Adelene Idler MD   Assistant(s): Thomasene Mohair MD  - No other skilled surgical assistant available. Anesthesia: Spinal Estimated Blood Loss: 405 cc Complications: None; patient tolerated the procedure well.   Disposition: PACU - hemodynamically stable. Condition: stable   Findings: A female infant in the cephalic presentation. Amniotic fluid - clear   Birth weight: 7 lbs 5oz Apgars of 8 and 9.  Intact placenta with a three-vessel cord. Grossly normal uterus, tubes and ovaries bilaterally. No intraabdominal adhesions were noted.   Procedure Details    The patient was taken to operating room, identified as the correct patient and the procedure verified as C-Section Delivery. A time out was held and the above information confirmed. After induction of anesthesia, the patient was draped and prepped in the usual sterile manner. A Pfannenstiel incision was made and carried down through the subcutaneous tissue to the fascia. Fascial incision was made and extended transversely with the Mayo scissors. The fascia was separated from the underlying rectus tissue superiorly and inferiorly. The peritoneum was identified and entered bluntly. Peritoneal incision was extended longitudinally. A low transverse hysterotomy was made. The fetus was delivered atraumatically. The umbilical cord was clamped x2 and cut and the infant was handed to the awaiting pediatricians. The placenta was removed intact and appeared normal with a 3-vessel cord. The uterus was exteriorized and cleared of all clot and debris.  The hysterotomy was closed with running sutures of 0 Vicryl suture. A second imbricating layer was placed with the same suture. Excellent hemostasis was observed.  Desire for tubal ligation was again verbally confirmed with the patient. The right fallopian tube was followed from the cornua to the fimbria. The babcock clamp was used to grasp and elevate an avascular midsection of the tube approximately 3-4cm from the cornua. 0-chromic suture was used passed through the mesosalpinx and used to create a knuckle to fallopian tube. The tube was double ligated with 0- chromic suture and the intervening portion of tube was transected and removed with the Metzenbaum scissors. Excellent hemostasis was noted. Attention was then turned to the left fallopian tube after confirmation of identification by tracing the tube out to the fimbriae. The same procedure was then performed on the left fallopian tube. Again, excellent hemostasis was noted at the end of the procedure at both sites of the tubal ligation.    The uterus was returned to the abdomen. The pelvis was irrigated and again, excellent hemostasis was noted. The peritoneum was closed with a running stitch of 2-0 Vicryl. The On Q Pain pump System was then placed.  Trocars were placed through the abdominal wall into the subfascial space and these were used to thread the silver soaker cathaters into place.The rectus muscles were inspected and were hemostatic. The rectus fascia was then reapproximated with running sutures of 0-vicryl, with careful placement not to incorporate the cathaters. Subcutaneous tissues are then irrigated with saline and hemostasis assured with the bovie. The subcutaneous fat was approximated with 3-0 plain and a running stitch.  The skin was closed with 4-0 monocryl suture in a subcuticular fashion followed by skin adhesive. The cathaters are flushed each with 5 mL of Bupivicaine  and stabilized into place with dressing. Instrument, sponge, and  needle counts were correct prior to the abdominal closure and at the conclusion of the case.  The patient tolerated the procedure well and was transferred to the recovery room in stable condition.   Dr. Jean Rosenthal assisted in all portions of this procedure in a case requiring a high level assistant. He retracted tissue, applied fundal pressure and assisted with dissection.   Natale Milch MD [facility], [facility] 01/29/20 9:29 AM

## 2020-01-29 NOTE — Lactation Note (Signed)
This note was copied from a baby's chart. Lactation Consultation Note  Patient Name: Shelby Aguilar Today's Date: 01/29/2020 Reason for consult: Initial assessment;Early term 37-38.6wks;Other (Comment) (BS, c/s delivery)  LC in to see mom and baby Shelby Aguilar. Mom is G3P3, insulin dependent GDM, delivered via c-section earlier this morning. Baby's initial BS was 21, followed by 2 breastfeeding sessions and 26mL of donor breast milk, following BS have been above minimum levels.  Mom has prior breastfeeding experience. She breastfed her first for 13 months, and pumped/bottle fed her second for 11 months due to tongue/lip tie (that was corrected). Mom is comfortable with getting baby into modified cradle position, manipulation of breast tissue to encourage optimal latch, and differentiating between good/bad latch. Baby is able to grasp the breast easily after a few attempts, and appears to have a strong rhythmic sucking pattern, no pain or discomfort noted with her latching per mom, and visible jaw movement indicates occasional swallows. Baby fed off/on for total of 35 minutes, mom attempting to offer opposite side, baby latching easily. LC reviewed newborn stomach size, normal feeding behaviors, early hunger cues, importance of feeding with cues and skin to skin time, output expectations, milk supply and demand, and normal course of lactation. Mom reports over supply with other children, and LC provided reassurance that LC can support her through management of that. Mom feels confident in breastfeeding, and acknowledges the [facility] number on whiteboard to call if she has any questions or needs any assistance.  Maternal Data Formula Feeding for Exclusion: No Has patient been taught Hand Expression?: Yes Does the patient have breastfeeding experience prior to this delivery?: Yes  Feeding Feeding Type: Breast Fed  LATCH Score Latch: Grasps breast easily, tongue down, lips flanged, rhythmical  sucking.  Audible Swallowing: A few with stimulation  Type of Nipple: Everted at rest and after stimulation  Comfort (Breast/Nipple): Soft / non-tender  Hold (Positioning): Assistance needed to correctly position infant at breast and maintain latch. (head support of baby)  LATCH Score: 8  Interventions Interventions: Breast feeding basics reviewed;Skin to skin;Support pillows;Position options;Hand express (head support of baby to latch)  Lactation Tools Discussed/Used     Consult Status Consult Status: PRN    Danford Bad 01/29/2020, 2:44 PM

## 2020-01-29 NOTE — Discharge Instructions (Signed)
Discharge Instructions:   Follow-up Appointment: 1 week, please call the office to schedule  If there are any new medications, they have been ordered and will be available for pickup at the listed pharmacy on your way home from the hospital.   Call the office if you have any of the following: headache, visual changes, fever >101.0 F, chills, shortness of breath, breast concerns, excessive vaginal bleeding, incision drainage or problems, leg pain or redness, depression or any other concerns. If you have vaginal discharge with an odor, let your doctor know.   It is normal to bleed for up to 6 weeks. You should not soak through more than 1 pad in 1 hour. If you have a blood clot larger than your fist with continued bleeding, call your doctor.   After a c-section, you should expect a small amount of blood or clear fluid coming from the incision and abdominal cramping/soreness. Inspect your incision site daily. Stand in front of a mirror to look for any redness, incision opening, or discolored/odorness drainage. Take a shower daily and continue good hygiene. Use own towel and washcloth (do not share). Make sure your sheets on your bed are clean. No pets sleeping around your incision site. Dressing will be removed at your postpartum visit. If the dressing does become wet or soiled underneath, it is okay to remove it before your visit.    On-Q pump: You will remove on day 4 after insertion or if the ball becomes flat before day 4. You will remove on: 02/02/2020  Activity: Do not lift > 15 lbs for 6 weeks (do not lift anything heavier than your baby). No intercourse, tampons, swimming pools, hot tubs, baths (only showers) for 6 weeks.  No driving for 1-2 weeks. Do not drive while taking narcotic or opioid pain medication.  Continue taking your prenatal vitamin, especially if breastfeeding. Increase calories and fluids (water) while breastfeeding.   Your milk will come in, in the next couple of days (right  now it is colostrum). You may have a slight fever when your milk comes in, but it should go away on its own.  If it does not, and rises above 101 F please call the doctor. You will also feel achy and your breasts will be firm. They will also start to leak. If you are breastfeeding, continue as you have been and you can pump/express milk for comfort.   If you have too much milk, your breasts can become engorged, which could lead to mastitis. This is an infection of the milk ducts. It can be very painful and you will need to notify your doctor to obtain a prescription for antibiotics. You can also treat it with a shower or hot/cold compress.   For concerns about your baby, please call your pediatrician.  For breastfeeding concerns, the lactation consultant can be reached at [phone].   Postpartum blues (feelings of happy one minute and sad another minute) are normal for the first few weeks but if it gets worse let your doctor know.   Congratulations! We enjoyed caring for you and your new bundle of joy!

## 2020-01-29 NOTE — Plan of Care (Signed)
Patient delivered at 0813 baby girl "Shelby Aguilar". Patient in PACU and recovering well. Pain being managed and patient bonding well with infant.

## 2020-01-29 NOTE — Anesthesia Procedure Notes (Signed)
Spinal  Patient location during procedure: OR Staffing Performed: anesthesiologist  Preanesthetic Checklist Completed: patient identified, IV checked, site marked, risks and benefits discussed, surgical consent, monitors and equipment checked, pre-op evaluation and timeout performed Spinal Block Patient position: sitting Prep: DuraPrep Patient monitoring: heart rate, cardiac monitor, continuous pulse ox and blood pressure Approach: midline Location: L4-5 Injection technique: single-shot Needle Needle type: Sprotte and Whitacre  Needle gauge: 24 G Needle length: 9 cm Assessment Sensory level: T4 Additional Notes Easy 1 pass and no pain.  Good csf flow. No paresthesia. vsst . Fawn Kirk

## 2020-01-29 NOTE — Anesthesia Preprocedure Evaluation (Signed)
Anesthesia Evaluation  Patient identified by MRN, date of birth, ID band Patient awake    Reviewed: Allergy & Precautions, H&P , NPO status , Patient's Chart, lab work & pertinent test results, reviewed documented beta blocker date and time   Airway Mallampati: II  TM Distance: >3 FB Neck ROM: full    Dental no notable dental hx. (+) Teeth Intact   Pulmonary asthma , Current Smoker,    Pulmonary exam normal breath sounds clear to auscultation       Cardiovascular Exercise Tolerance: Good negative cardio ROS   Rhythm:regular Rate:Normal     Neuro/Psych PSYCHIATRIC DISORDERS Anxiety Depression Chronic low back pain pre pregnancy and continues.  negative neurological ROS     GI/Hepatic Neg liver ROS, GERD  Medicated,  Endo/Other  diabetes, Well Controlled, Type 1, Insulin DependentMorbid obesity  Renal/GU      Musculoskeletal   Abdominal   Peds  Hematology  (+) Blood dyscrasia, anemia ,   Anesthesia Other Findings   Reproductive/Obstetrics (+) Pregnancy                             Anesthesia Physical Anesthesia Plan  ASA: III  Anesthesia Plan: Spinal   Post-op Pain Management:  Regional for Post-op pain   Induction:   PONV Risk Score and Plan: 3  Airway Management Planned:   Additional Equipment:   Intra-op Plan:   Post-operative Plan:   Informed Consent: I have reviewed the patients History and Physical, chart, labs and discussed the procedure including the risks, benefits and alternatives for the proposed anesthesia with the patient or authorized representative who has indicated his/her understanding and acceptance.       Plan Discussed with:   Anesthesia Plan Comments:         Anesthesia Quick Evaluation

## 2020-01-30 LAB — CBC
HCT: 31.2 % — ABNORMAL LOW (ref 36.0–46.0)
Hemoglobin: 10.5 g/dL — ABNORMAL LOW (ref 12.0–15.0)
MCH: 26.2 pg (ref 26.0–34.0)
MCHC: 33.7 g/dL (ref 30.0–36.0)
MCV: 77.8 fL — ABNORMAL LOW (ref 80.0–100.0)
Platelets: 168 10*3/uL (ref 150–400)
RBC: 4.01 MIL/uL (ref 3.87–5.11)
RDW: 15.9 % — ABNORMAL HIGH (ref 11.5–15.5)
WBC: 11.6 10*3/uL — ABNORMAL HIGH (ref 4.0–10.5)
nRBC: 0 % (ref 0.0–0.2)

## 2020-01-30 LAB — GLUCOSE, CAPILLARY
Glucose-Capillary: 111 mg/dL — ABNORMAL HIGH (ref 70–99)
Glucose-Capillary: 107 mg/dL — ABNORMAL HIGH (ref 70–99)
Glucose-Capillary: 124 mg/dL — ABNORMAL HIGH (ref 70–99)
Glucose-Capillary: 110 mg/dL — ABNORMAL HIGH (ref 70–99)

## 2020-01-30 LAB — HEMOGLOBIN A1C
Hgb A1c MFr Bld: 5.6 % (ref 4.8–5.6)
Mean Plasma Glucose: 114 mg/dL

## 2020-01-30 LAB — SURGICAL PATHOLOGY

## 2020-01-30 MED ORDER — INSULIN ASPART 100 UNIT/ML ~~LOC~~ SOLN
0.0000 [IU] | Freq: Three times a day (TID) | SUBCUTANEOUS | Status: DC
  Administered 2020-01-30: 3 [IU] via SUBCUTANEOUS
  Filled 2020-01-30: qty 1

## 2020-01-30 NOTE — Anesthesia Postprocedure Evaluation (Signed)
Anesthesia Post Note  Patient: Shelby Aguilar  Procedure(s) Performed: CESAREAN SECTION WITH BILATERAL TUBAL LIGATION (Bilateral )  Patient location during evaluation: Mother Baby Anesthesia Type: Spinal Level of consciousness: oriented and awake and alert Pain management: pain level controlled Vital Signs Assessment: post-procedure vital signs reviewed and stable Respiratory status: spontaneous breathing and respiratory function stable Cardiovascular status: blood pressure returned to baseline and stable Postop Assessment: no headache, no backache, no apparent nausea or vomiting and able to ambulate Anesthetic complications: no   No complications documented.   Last Vitals:  Vitals:   01/30/20 0130 01/30/20 0310  BP:    Pulse:    Resp:    Temp:    SpO2: 100% 100%    Last Pain:  Vitals:   01/30/20 0310  TempSrc:   PainSc: 0-No pain                 Lynden Oxford

## 2020-01-30 NOTE — Lactation Note (Signed)
This note was copied from a baby's chart. Lactation Consultation Note  Patient Name: Girl Luverna Degenhart [id number]'Y Date: 01/30/2020 Reason for consult: Follow-up assessment;Early term 37-38.6wks;Other (Comment) (c-section)  Lactation follow-up. Breastfeeding continuing to go well per mom, and RN report. Baby sleeping soundly at this time. No concerns reported; no stool since delivery, voiding well. Audible swallows when baby feeds, bilirubin levels within normal ranges. Encouraged continued feedings on demand, skin to skin as much as possible. Call out for support PRN.  Maternal Data Formula Feeding for Exclusion: No Has patient been taught Hand Expression?: Yes Does the patient have breastfeeding experience prior to this delivery?: Yes  Feeding    LATCH Score                   Interventions Interventions: Breast feeding basics reviewed  Lactation Tools Discussed/Used     Consult Status Consult Status: PRN    Danford Bad 01/30/2020, 11:11 AM

## 2020-01-30 NOTE — Anesthesia Post-op Follow-up Note (Signed)
°  Anesthesia Pain Follow-up Note  Patient: Shelby Aguilar  Day #: 1  Date of Follow-up: 01/30/2020 Time: 7:34 AM  Last Vitals:  Vitals:   01/30/20 0130 01/30/20 0310  BP:    Pulse:    Resp:    Temp:    SpO2: 100% 100%    Level of Consciousness: alert  Pain: mild   Side Effects:None  Catheter Site Exam:clean, dry  Anti-Coag Meds (From admission, onward)   Start     Dose/Rate Route Frequency Ordered Stop   01/30/20 0800  enoxaparin (LOVENOX) injection 40 mg        40 mg Subcutaneous Every 24 hours 01/29/20 1232         Plan: Catheter removed/tip intact at surgeon's request and D/C from anesthesia care at surgeon's request  Lynden Oxford

## 2020-01-30 NOTE — Progress Notes (Signed)
Subjective: Postpartum Day 1: Cesarean Delivery Patient reports tolerating PO, + flatus and no problems voiding.  She is up ambulating in the room. Her pain is well controlled with oral medication. Objective: Vital signs in last 24 hours: Temp:  [98.1 F (36.7 C)-98.5 F (36.9 C)] 98.2 F (36.8 C) (08/11 0808) Pulse Rate:  [67-80] 70 (08/11 0808) Resp:  [9-25] 18 (08/11 0808) BP: (104-124)/(63-77) 117/73 (08/11 0808) SpO2:  [93 %-100 %] 99 % (08/11 8295)  Physical Exam:  General: alert, cooperative, no distress and morbidly obese Lochia: appropriate Uterine Fundus: firm Incision: healing well, no significant drainage, and On Q is in place. DVT Evaluation: No evidence of DVT seen on physical exam. Negative Homan's sign.  Recent Labs    01/29/20 0701 01/30/20 0606  HGB 10.7* 10.5*  HCT 32.9* 31.2*    Assessment/Plan: Status post Cesarean section. Doing well postoperatively.  Continue current care.  Mirna Mires 01/30/2020, 10:09 AM

## 2020-01-31 DIAGNOSIS — O0993 Supervision of high risk pregnancy, unspecified, third trimester: Secondary | ICD-10-CM

## 2020-01-31 LAB — GLUCOSE, CAPILLARY
Glucose-Capillary: 96 mg/dL (ref 70–99)
Glucose-Capillary: 89 mg/dL (ref 70–99)

## 2020-01-31 MED ORDER — OXYCODONE HCL 5 MG PO TABS: 5.0000 mg | ORAL_TABLET | Freq: Four times a day (QID) | ORAL | 0 refills | Status: AC | PRN

## 2020-01-31 NOTE — Lactation Note (Signed)
This note was copied from a baby's chart. Lactation Consultation Note  Patient Name: Shelby Aguilar [id number] Date: 01/31/2020 Reason for consult: Follow-up assessment;Early term 37-38.6wks;Other (Comment) (c-section)  Lactation follow-up before discharge. Mom reports cluster feeding overnight, no questions/concerns. Mom feels confident in breastfeeding abilities, and knows resources if she has questions/concerns post discharge. Reviewed with mom breastfeeding expectations for upcoming days/weeks, output expectations, growth spurts/cluster feeding, milk supply and demand, and normal course of lactation. LC provided mom with guidance on milk production management (with history of over supply), breast and nipple care, and management of breast fullness, signs of engorgement, plugged ducts, and mastitis and when to seek care. Informational sheet given for outpatient lactation services and community resources.  Maternal Data Formula Feeding for Exclusion: No Has patient been taught Hand Expression?: Yes Does the patient have breastfeeding experience prior to this delivery?: Yes  Feeding Feeding Type: Breast Fed  LATCH Score                   Interventions Interventions: Breast feeding basics reviewed  Lactation Tools Discussed/Used     Consult Status Consult Status: Complete    Danford Bad 01/31/2020, 9:45 AM

## 2020-01-31 NOTE — Discharge Summary (Signed)
OB Discharge Summary     Patient Name: Shelby Aguilar DOB: Aug 09, 1985 MRN: [id number]  Date of admission: 01/29/2020 Delivering MD: Adelene Idler  Date of Delivery: 01/29/2020  Date of discharge: 01/31/2020  Admitting diagnosis: Supervision of high risk pregnancy in third trimester [O09.93] Intrauterine pregnancy: [redacted]w[redacted]d     Secondary diagnosis: Gestational Diabetes medication controlled (A2)     Discharge diagnosis: Term Pregnancy Delivered and GDM A2                                                                                                Post partum procedures:postpartum tubal ligation  Augmentation: N/A  Complications: None  Hospital course:  Sceduled C/S   34 y.o. yo G3P3003 at [redacted]w[redacted]d was admitted to the hospital 01/29/2020 for scheduled cesarean section with the following indication:Elective Repeat.Delivery details are as follows:  Membrane Rupture Time/Date: 8:12 AM ,01/29/2020   Delivery Method:C-Section, Low Transverse  Details of operation can be found in separate operative note.    Patient had an uncomplicated postpartum course.  She is tolerating carb modified diet. Her pain is well controlled with PO medication and On Q pump. She is ambulating and voiding without difficulty. Has had BM since delivery. Patient is discharged home in stable condition on  01/31/20        Newborn Data: Birth date:01/29/2020  Birth time:8:13 AM  Gender:Female   [place] Living status:Living  Apgars:8 ,9  Weight:3320 g   7 pounds 5 ounces  Physical exam  Vitals:   01/30/20 1120 01/30/20 1518 01/30/20 2235 01/31/20 0812  BP:  119/78 126/72 115/72  Pulse:  75 72 71  Resp:  20 18 20   Temp:  98 F (36.7 C) 98 F (36.7 C) 98.1 F (36.7 C)  TempSrc:  Oral Oral Oral  SpO2: 98% 99% 100% 100%  Weight:      Height:       General: alert, cooperative and no distress Lochia: appropriate Uterine Fundus: firm Incision: Healing well with no significant drainage, Dressing is clean,  dry, and intact, On Q pump intact DVT Evaluation: No evidence of DVT seen on physical exam.  Labs: Lab Results  Component Value Date   WBC 11.6 (H) 01/30/2020   HGB 10.5 (L) 01/30/2020   HCT 31.2 (L) 01/30/2020   MCV 77.8 (L) 01/30/2020   PLT 168 01/30/2020    Discharge instruction: per After Visit Summary.  Medications:  Allergies as of 01/31/2020   No Known Allergies     Medication List    STOP taking these medications   albuterol 108 (90 Base) MCG/ACT inhaler Commonly known as: Ventolin HFA   BD Pen Needle Micro U/F 32G X 6 MM Misc Generic drug: Insulin Pen Needle   famotidine 20 MG tablet Commonly known as: PEPCID   insulin lispro 100 UNIT/ML KwikPen Commonly known as: HumaLOG KwikPen   Insulin NPH (Human) (Isophane) 100 UNIT/ML Kiwkpen Commonly known as: HUMULIN N     TAKE these medications   budesonide 0.25 MG/2ML nebulizer solution Commonly known as: PULMICORT Take 2 mLs (0.25 mg total) by nebulization 2 (two) times daily.  chlorhexidine 0.12 % solution Commonly known as: PERIDEX SMARTSIG:By Mouth   ferrous sulfate 325 (65 FE) MG tablet Take 325 mg by mouth 2 (two) times daily with a meal.   montelukast 10 MG tablet Commonly known as: SINGULAIR Take 1 tablet (10 mg total) by mouth daily.   multivitamin-prenatal 27-0.8 MG Tabs tablet Take 1 tablet by mouth daily.   onetouch ultrasoft lancets Use as instructed   OneTouch Verio test strip Generic drug: glucose blood TEST BLOOD SUGAR 4 TIMES DAILY   oxyCODONE 5 MG immediate release tablet Commonly known as: Oxy IR/ROXICODONE Take 1-2 tablets (5-10 mg total) by mouth every 6 (six) hours as needed for up to 5 days for moderate pain or severe pain.   sertraline 50 MG tablet Commonly known as: ZOLOFT TAKE 1 TABLET BY MOUTH EVERY DAY            Discharge Care Instructions  (From admission, onward)         Start     Ordered   01/31/20 0000  Discharge wound care:       Comments: Keep  incision dry, clean.   01/31/20 1027          Diet: carb modified diet  Activity: Advance as tolerated. Pelvic rest for 6 weeks.   Outpatient follow up:  Follow-up Information    Schuman, Jaquelyn Bitter, MD In 1 week.   Specialty: Obstetrics and Gynecology Why: For incision check Contact information: [address] [place] [address] [phone]  Will need 2 hour gtt at 6 week postpartum visit               Postpartum contraception: Tubal Ligation Rhogam Given postpartum: NA Rubella vaccine given postpartum: no Varicella vaccine given postpartum: no TDaP given antepartum or postpartum: given antepartum   Newborn Delivery   Birth date/time: 01/29/2020 08:13:00 Delivery type: C-Section, Low Transverse Trial of labor: No C-section categorization: Repeat       Baby Feeding: Breast  Disposition:home with mother  SIGNED:  Tresea Mall, CNM 01/31/2020 10:28 AM

## 2020-01-31 NOTE — Progress Notes (Signed)
Pt. discharged home in stable condition. Maternal and child discharge instructions and follow-up care reviewed-pt.verbalized understanding. Pt. discharged home in private vehicle with support person. Pt. stable at discharge.  

## 2020-02-01 ENCOUNTER — Other Ambulatory Visit: Payer: Managed Care, Other (non HMO)

## 2020-02-03 ENCOUNTER — Ambulatory Visit: Payer: Self-pay

## 2020-02-03 NOTE — Lactation Note (Signed)
This note was copied from a baby's chart. Lactation Consultation Note  Patient Name: Shelby Aguilar [email or web address] Date: 02/03/2020 Reason for consult: Follow-up assessment;Difficult latch;Early term 37-38.6wks;Infant weight loss;Hyperbilirubinemia (Kept falling asleep at the breast)  Shelby Aguilar Setting is 2 days old today (Born [birth year] at 08:13 am).  Her birth weight was 7-5 (3320 gms).  On 01/29/20 at 1925 pm her weight was 3280 gms with a 1.2% weight loss.  On 01/30/20 before discharge her weight was 3100 gms with a 6.6% weight loss.  At her Pediatric visit on 02/01/20 mom said she weighed 6-9.  Mom and baby came in today for an out patient lactation consult because she would not go to the breast after she let the father of the baby give a bottle of formula.  Since then she has only been pumping and giving bottles every 2 to 3 hours but cannot get her to take but 1/2 to 1 1/2 oz before falling asleep and refusing to take any more.  She sucks down the bottle in 5 to 10 minutes.  Mom reports that she usually spits a small amount after about every feeding.  She is having 6 to 8 voids and 3 or more yellow seedy stools now in 24 hours.  Today's nude weight was 3000 gms (6-9.8).  Shelby Aguilar was slightly jaundiced.  Transcutaneous bilirubin was 15.2.  She had fed 3 hours before with a bottle and got to take a little over an oz.  She was sleepy and lethargic upon first appearance.  She woke up and was very vigorous when large void and yellow seedy stool was changed. Tongue tie and lip tie noted causing her to curl in top lip.  Mom reports breastfeeding her first baby for 13 months.  Her second baby had a tongue and lip tie causing her a great deal of pain.  She had it repaired but pumped for 12 months with him.  She has always had an over abundant milk supply. We tried her in football hold which mom said she had been feeding her in at first, but she refused with or without a nipple shield.  We were able to get her to  latch with #24 nipple shield to left breast curling out top lip and flanging lower lip.  Mom has large nipples and breasts are hard and full.  She required frequent massaging of the breast and stimulation to keep her actively sucking at the breast.  Frequent audible swallows were heard.  After less than 10 minutes she fell asleep and kept pushing out the nipple shield.  Per pre and post feeding weight she had only taken 16 ml at the breast.  We aroused her and burped her.  She spit scant amount of undigested breast milk.  She went back to the same breast to make sure she got all hind milk.  Same scenario with massaging of the breast and frequent stimulation for another 10 minutes before she fell sound asleep, refusing to go back on the breast.  She only took another 22 ml for a total of 38 ml intake at this breast feed in 20 minutes.  Explained to parents that for Shelby Aguilar to gain weight, she needed to take in over 2 oz 8 or more times a day for at least 18 oz in 24 hours.  Shelby Aguilar has a follow up appointment with [facility] at 10:00 am.  Encouraged mom to put Shelby Aguilar to the breast whenever she demonstrated feeding cues for  the next 24 hours but not to let her go more than 3 hours in between feedings.  If she did not have a really good feeding with frequent audible swallows, she needed to give her pumped breast milk, preferably 2 to 3 oz.  Will follow up with mom in am, but encouraged mom to call with any questions or concerns.     Maternal Data Formula Feeding for Exclusion: No Has patient been taught Hand Expression?: Yes Does the patient have breastfeeding experience prior to this delivery?: Yes  Feeding Feeding Type: Breast Fed  LATCH Score Latch: Repeated attempts needed to sustain latch, nipple held in mouth throughout feeding, stimulation needed to elicit sucking reflex.  Audible Swallowing: Spontaneous and intermittent  Type of Nipple: Everted at rest and after stimulation (Large  nipples)  Comfort (Breast/Nipple): Engorged, cracked, bleeding, large blisters, severe discomfort  Hold (Positioning): Assistance needed to correctly position infant at breast and maintain latch.  LATCH Score: 6  Interventions Interventions: Assisted with latch;Skin to skin;Breast massage;Hand express;Breast compression;Adjust position;Support pillows;Position options  Lactation Tools Discussed/Used Tools: Nipple Shields Nipple shield size: 24 WIC Program: No   Consult Status Consult Status: PRN    Louis Meckel 02/03/2020, 3:47 PM

## 2020-02-04 ENCOUNTER — Telehealth: Payer: Self-pay

## 2020-02-04 ENCOUNTER — Other Ambulatory Visit: Payer: Managed Care, Other (non HMO)

## 2020-02-04 NOTE — Telephone Encounter (Signed)
Pt calling; has c/s 8/10th; first day of missed work was 8/2 d/t issues she was having; was supposed to have 12wks FMLA/disabiliy; CRS adv her to talk to who fills them out.  [phone]

## 2020-02-05 ENCOUNTER — Ambulatory Visit (INDEPENDENT_AMBULATORY_CARE_PROVIDER_SITE_OTHER): Payer: Managed Care, Other (non HMO) | Admitting: Obstetrics and Gynecology

## 2020-02-05 ENCOUNTER — Encounter: Payer: Self-pay | Admitting: Obstetrics and Gynecology

## 2020-02-05 ENCOUNTER — Other Ambulatory Visit: Payer: Self-pay

## 2020-02-05 VITALS — BP 123/70 | Ht 63.0 in | Wt 231.4 lb

## 2020-02-05 DIAGNOSIS — Z98891 History of uterine scar from previous surgery: Secondary | ICD-10-CM

## 2020-02-05 NOTE — Progress Notes (Signed)
  OBSTETRICS POSTPARTUM CLINIC PROGRESS NOTE  Subjective:     Shelby Aguilar is a 34 y.o. G50P3003 female who presents for a postpartum visit. She is 1 week postpartum following a Term pregnancy and delivery by C-section repeat; no problems after deliver.  I have fully reviewed the prenatal and intrapartum course. Anesthesia: spinal.  Postpartum course has been complicated by uncomplicated.  Baby is feeding by Bottle and Breast.  Bleeding: patient has not  resumed menses.  Bowel function is normal. Bladder function is normal.  Patient is not sexually active. Contraception method desired is tubal ligation.  Postpartum depression screening: negative. The following portions of the patient's history were reviewed and updated as appropriate: allergies, current medications, past family history, past medical history, past social history, past surgical history and problem list.  Review of Systems Pertinent items are noted in HPI.  Objective:    BP 123/70   Ht 5\' 3"  (1.6 m)   Wt 231 lb 6.4 oz (105 kg)   Breastfeeding Yes   BMI 40.99 kg/m   General:  alert and no distress   Breasts:  inspection negative, no nipple discharge or bleeding, no masses or nodularity palpable  Lungs: clear to auscultation bilaterally  Heart:  regular rate and rhythm, S1, S2 normal, no murmur, click, rub or gallop  Abdomen: soft, non-tender; bowel sounds normal; no masses,  no organomegaly.   Well healed Pfannenstiel incision                            Assessment:  Post Partum Care visit There are no diagnoses linked to this encounter.  Plan:  See orders and Patient Instructions Follow up in: 5 weeks or as needed.   MD, Adelene [facility], [facility] 02/05/2020 4:27 PM

## 2020-02-06 ENCOUNTER — Ambulatory Visit: Payer: Managed Care, Other (non HMO) | Admitting: Obstetrics and Gynecology

## 2020-02-06 NOTE — Telephone Encounter (Signed)
FMLA ready for pick up. Pt aware via vm

## 2020-02-11 ENCOUNTER — Other Ambulatory Visit: Payer: Managed Care, Other (non HMO)

## 2020-02-12 ENCOUNTER — Inpatient Hospital Stay: Payer: Managed Care, Other (non HMO)

## 2020-02-12 ENCOUNTER — Inpatient Hospital Stay: Payer: Managed Care, Other (non HMO) | Attending: Hematology and Oncology | Admitting: Hematology and Oncology

## 2020-02-12 DIAGNOSIS — D509 Iron deficiency anemia, unspecified: Secondary | ICD-10-CM | POA: Insufficient documentation

## 2020-02-15 ENCOUNTER — Other Ambulatory Visit: Payer: Self-pay | Admitting: Primary Care

## 2020-03-11 ENCOUNTER — Encounter: Payer: Self-pay | Admitting: Obstetrics and Gynecology

## 2020-03-11 ENCOUNTER — Other Ambulatory Visit: Payer: Self-pay

## 2020-03-11 ENCOUNTER — Inpatient Hospital Stay: Payer: Managed Care, Other (non HMO) | Attending: Hematology and Oncology

## 2020-03-11 ENCOUNTER — Ambulatory Visit (INDEPENDENT_AMBULATORY_CARE_PROVIDER_SITE_OTHER): Payer: Managed Care, Other (non HMO) | Admitting: Obstetrics and Gynecology

## 2020-03-11 VITALS — BP 120/70 | HR 70 | Resp 18 | Ht 63.0 in | Wt 223.8 lb

## 2020-03-11 DIAGNOSIS — E538 Deficiency of other specified B group vitamins: Secondary | ICD-10-CM | POA: Diagnosis present

## 2020-03-11 DIAGNOSIS — G4730 Sleep apnea, unspecified: Secondary | ICD-10-CM

## 2020-03-11 DIAGNOSIS — O99345 Other mental disorders complicating the puerperium: Secondary | ICD-10-CM

## 2020-03-11 DIAGNOSIS — Z98891 History of uterine scar from previous surgery: Secondary | ICD-10-CM

## 2020-03-11 DIAGNOSIS — Z131 Encounter for screening for diabetes mellitus: Secondary | ICD-10-CM

## 2020-03-11 DIAGNOSIS — N760 Acute vaginitis: Secondary | ICD-10-CM

## 2020-03-11 DIAGNOSIS — F53 Postpartum depression: Secondary | ICD-10-CM

## 2020-03-11 DIAGNOSIS — N941 Unspecified dyspareunia: Secondary | ICD-10-CM

## 2020-03-11 DIAGNOSIS — R102 Pelvic and perineal pain: Secondary | ICD-10-CM

## 2020-03-11 MED ORDER — CYANOCOBALAMIN 1000 MCG/ML IJ SOLN
1000.0000 ug | Freq: Once | INTRAMUSCULAR | Status: AC
  Administered 2020-03-11: 1000 ug via INTRAMUSCULAR

## 2020-03-11 NOTE — Patient Instructions (Signed)
Screening for Type 2 Diabetes  A screening test for type 2 diabetes (type 2 diabetes mellitus) is a blood test to measure your blood sugar (glucose) level. This test is done to check for early signs of diabetes, before you develop symptoms.  Type 2 diabetes is a long-term (chronic) disease. In type 2 diabetes, one or both of these problems may be present:  The pancreas does not make enough of a hormone called insulin.  Cells in the body do not respond properly to insulin that the body makes (insulin resistance). Normally, insulin allows blood sugar (glucose) to enter cells in the body. The cells use glucose for energy. Insulin resistance or lack of insulin causes excess glucose to build up in the blood instead of going into cells. This results in high blood glucose levels (hyperglycemia), which can cause many complications. You may be screened for type 2 diabetes as part of your regular health care, especially if you have a high risk for diabetes. Screening can help to identify type 2 diabetes at its early stage (prediabetes). Identifying and treating prediabetes may delay or prevent the development of type 2 diabetes. What are the risk factors for type 2 diabetes? The following factors may make you more likely to develop type 2 diabetes:  Having a parent or sibling (first-degree relative) who has diabetes.  Being overweight or obese.  Being of American-Indian, African-American, Hispanic, Latino, Asian, or [place] descent.  Not getting enough exercise (having a sedentary lifestyle).  Being older than age 53.  Having a history of diabetes during pregnancy (gestational diabetes).  Having low levels of good cholesterol (HDL-C) or high levels of blood fats (triglycerides).  Having high blood glucose in a previous blood test.  Having high blood pressure.  Having certain diseases or conditions that may be caused by insulin resistance, including: ? Acanthosis nigricans. This is a  condition that causes dark skin on the neck, armpits, and groin. ? Polycystic ovary syndrome (PCOS). ? Cardiovascular heart disease. Who should be screened for type 2 diabetes? Adults  Adults age 27 and older. These adults should be screened once every three years.  Adults who are younger than age 36, are overweight, and have one other risk factor. These adults should be screened once every three years.  Adults who have normal blood glucose levels and two or more risk factors. These adults may be screened once every year (annually).  Women who have had gestational diabetes in the past. These women should be screened once every three years.  Pregnant women who have risk factors. These women should be screened at their first prenatal visit and again between weeks 24 and 28 of pregnancy. Children and adolescents  Children and adolescents should be screened for type 2 diabetes if they are overweight and have any of the following risk factors: ? A family history of type 2 diabetes. ? Being a member of a high-risk ethnic group. ? Signs of insulin resistance or conditions that are associated with insulin resistance. ? A mother who had gestational diabetes while pregnant with him or her.  Screening should be done at least once every three years, starting at age 15 or at the onset of puberty, whichever comes first. Your health care provider or your child's health care provider may recommend having a screening more or less often. What happens during screening? During screening, your health care provider may ask questions about:  Your health and your risk factors, including your activity level and any medical conditions  that you have.  The health of your first-degree relatives.  Past pregnancies, if this applies. Your health care provider will also do a physical exam, including a blood pressure measurement and blood tests. There are four blood tests that can be used to screen for type 2  diabetes. You may have one or more of the following:  A fasting blood glucose (FBG) test. You will not be allowed to eat (you will fast) for 8 hours or more before a blood sample is taken.  A random blood glucose test. This test checks your blood glucose at any time of the day regardless of when you ate.  An oral glucose tolerance test (OGTT). This test measures your blood glucose at two times: ? After you have not eaten (have fasted) overnight. This is your baseline glucose level. ? Two hours after you drink a glucose-containing beverage.  An A1c (hemoglobin A1c) blood test. This test provides information about blood glucose control over the previous 2-3 months. What do the results mean? Your test results are a measurement of how much glucose is in your blood. Normal blood glucose levels mean that you do not have diabetes or prediabetes. High blood glucose levels may mean that you have prediabetes or diabetes. Depending on the results, other tests may be needed to confirm the diagnosis. You may be diagnosed with type 2 diabetes if:  Your FBG level is 126 mg/dL (7.0 mmol/L) or higher.  Your random blood glucose level is 200 mg/dL (42.5 mmol/L) or higher.  Your A1c level is 6.5% or higher.  Your OGTT result is higher than 200 mg/dL (95.6 mmol/L). These blood tests may be repeated to confirm your diagnosis. Talk with your health care provider about what your results mean. Summary  A screening test for type 2 diabetes (type 2 diabetes mellitus) is a blood test to measure your blood sugar (glucose) level.  Know what your risk factors are for developing type 2 diabetes.  If you are at risk, get screening tests as often as told by your health care provider.  Screening may help you identify type 2 diabetes at its early stage (prediabetes). Identifying and treating prediabetes may delay or prevent the development of type 2 diabetes. This information is not intended to replace advice given to  you by your health care provider. Make sure you discuss any questions you have with your health care provider. Document Revised: 09/29/2018 Document Reviewed: 07/10/2016 Elsevier Patient Education  2020 Elsevier Inc. Sleep Apnea Sleep apnea is a condition in which breathing pauses or becomes shallow during sleep. Episodes of sleep apnea usually last 10 seconds or longer, and they may occur as many as 20 times an hour. Sleep apnea disrupts your sleep and keeps your body from getting the rest that it needs. This condition can increase your risk of certain health problems, including:  Heart attack.  Stroke.  Obesity.  Diabetes.  Heart failure.  Irregular heartbeat. What are the causes? There are three kinds of sleep apnea:  Obstructive sleep apnea. This kind is caused by a blocked or collapsed airway.  Central sleep apnea. This kind happens when the part of the brain that controls breathing does not send the correct signals to the muscles that control breathing.  Mixed sleep apnea. This is a combination of obstructive and central sleep apnea. The most common cause of this condition is a collapsed or blocked airway. An airway can collapse or become blocked if:  Your throat muscles are abnormally relaxed.  Your tongue and tonsils are larger than normal.  You are overweight.  Your airway is smaller than normal. What increases the risk? You are more likely to develop this condition if you:  Are overweight.  Smoke.  Have a smaller than normal airway.  Are elderly.  Are female.  Drink alcohol.  Take sedatives or tranquilizers.  Have a family history of sleep apnea. What are the signs or symptoms? Symptoms of this condition include:  Trouble staying asleep.  Daytime sleepiness and tiredness.  Irritability.  Loud snoring.  Morning headaches.  Trouble concentrating.  Forgetfulness.  Decreased interest in sex.  Unexplained sleepiness.  Mood  swings.  Personality changes.  Feelings of depression.  Waking up often during the night to urinate.  Dry mouth.  Sore throat. How is this diagnosed? This condition may be diagnosed with:  A medical history.  A physical exam.  A series of tests that are done while you are sleeping (sleep study). These tests are usually done in a sleep lab, but they may also be done at home. How is this treated? Treatment for this condition aims to restore normal breathing and to ease symptoms during sleep. It may involve managing health issues that can affect breathing, such as high blood pressure or obesity. Treatment may include:  Sleeping on your side.  Using a decongestant if you have nasal congestion.  Avoiding the use of depressants, including alcohol, sedatives, and narcotics.  Losing weight if you are overweight.  Making changes to your diet.  Quitting smoking.  Using a device to open your airway while you sleep, such as: ? An oral appliance. This is a custom-made mouthpiece that shifts your lower jaw forward. ? A continuous positive airway pressure (CPAP) device. This device blows air through a mask when you breathe out (exhale). ? A nasal expiratory positive airway pressure (EPAP) device. This device has valves that you put into each nostril. ? A bi-level positive airway pressure (BPAP) device. This device blows air through a mask when you breathe in (inhale) and breathe out (exhale).  Having surgery if other treatments do not work. During surgery, excess tissue is removed to create a wider airway. It is important to get treatment for sleep apnea. Without treatment, this condition can lead to:  High blood pressure.  Coronary artery disease.  In men, an inability to achieve or maintain an erection (impotence).  Reduced thinking abilities. Follow these instructions at home: Lifestyle  Make any lifestyle changes that your health care provider recommends.  Eat a healthy,  well-balanced diet.  Take steps to lose weight if you are overweight.  Avoid using depressants, including alcohol, sedatives, and narcotics.  Do not use any products that contain nicotine or tobacco, such as cigarettes, e-cigarettes, and chewing tobacco. If you need help quitting, ask your health care provider. General instructions  Take over-the-counter and prescription medicines only as told by your health care provider.  If you were given a device to open your airway while you sleep, use it only as told by your health care provider.  If you are having surgery, make sure to tell your health care provider you have sleep apnea. You may need to bring your device with you.  Keep all follow-up visits as told by your health care provider. This is important. Contact a health care provider if:  The device that you received to open your airway during sleep is uncomfortable or does not seem to be working.  Your symptoms do not improve.  Your symptoms get worse. Get help right away if:  You develop: ? Chest pain. ? Shortness of breath. ? Discomfort in your back, arms, or stomach.  You have: ? Trouble speaking. ? Weakness on one side of your body. ? Drooping in your face. These symptoms may represent a serious problem that is an emergency. Do not wait to see if the symptoms will go away. Get medical help right away. Call your local emergency services (911 in the [place] Do not drive yourself to the hospital. Summary  Sleep apnea is a condition in which breathing pauses or becomes shallow during sleep.  The most common cause is a collapsed or blocked airway.  The goal of treatment is to restore normal breathing and to ease symptoms during sleep. This information is not intended to replace advice given to you by your health care provider. Make sure you discuss any questions you have with your health care provider. Document Revised: 11/22/2018 Document Reviewed: 01/31/2018 Elsevier  Patient Education  2020 Elsevier Inc. Perinatal Depression When a woman feels excessive sadness, anger, or anxiety during pregnancy or during the first 12 months after she gives birth, she has a condition called perinatal depression. Depression can interfere with work, school, relationships, and other everyday activities. If it is not managed properly, it can also cause problems in the mother and her baby. Sometimes, perinatal depression is left untreated because symptoms are thought to be normal mood swings during and right after pregnancy. If you have symptoms of depression, it is important to talk with your health care provider. What are the causes? The exact cause of this condition is not known. Hormonal changes during and after pregnancy may play a role in causing perinatal depression. What increases the risk? You are more likely to develop this condition if:  You have a personal or family history of depression, anxiety, or mood disorders.  You experience a stressful life event during pregnancy, such as the death of a loved one.  You have a lot of regular life stress.  You do not have support from family members or loved ones, or you are in an abusive relationship. What are the signs or symptoms? Symptoms of this condition include:  Feeling sad or hopeless.  Feelings of guilt.  Feeling irritable or overwhelmed.  Changes in your appetite.  Lack of energy or motivation.  Sleep problems.  Difficulty concentrating or completing tasks.  Loss of interest in hobbies or relationships.  Headaches or stomach problems that do not go away. How is this diagnosed? This condition is diagnosed based on a physical exam and mental evaluation. In some cases, your health care provider may use a depression screening tool. These tools include a list of questions that can help a health care provider diagnose depression. Your health care provider may refer you to a mental health expert who  specializes in depression. How is this treated? This condition may be treated with:  Medicines. Your health care provider will only give you medicines that have been proven safe for pregnancy and breastfeeding.  Talk therapy with a mental health professional to help change your patterns of thinking (cognitive behavioral therapy).  Support groups.  Brain stimulation or light therapies.  Stress reduction therapies, such as mindfulness. Follow these instructions at home: Lifestyle  Do not use any products that contain nicotine or tobacco, such as cigarettes and e-cigarettes. If you need help quitting, ask your health care provider.  Do not use alcohol when you are pregnant. After your  baby is born, limit alcohol intake to no more than 1 drink a day. One drink equals 12 oz of beer, 5 oz of wine, or 1 oz of hard liquor.  Consider joining a support group for new mothers. Ask your health care provider for recommendations.  Take good care of yourself. Make sure you: ? Get plenty of sleep. If you are having trouble sleeping, talk with your health care provider. ? Eat a healthy diet. This includes plenty of fruits and vegetables, whole grains, and lean proteins. ? Exercise regularly, as told by your health care provider. Ask your health care provider what exercises are safe for you. General instructions  Take over-the-counter and prescription medicines only as told by your health care provider.  Talk with your partner or family members about your feelings during pregnancy. Share any concerns or anxieties that you may have.  Ask for help with tasks or chores when you need it. Ask friends and family members to provide meals, watch your children, or help with cleaning.  Keep all follow-up visits as told by your health care provider. This is important. Contact a health care provider if:  You (or people close to you) notice that you have any symptoms of depression.  You have depression and  your symptoms get worse.  You experience side effects from medicines, such as nausea or sleep problems. Get help right away if:  You feel like hurting yourself, your baby, or someone else. If you ever feel like you may hurt yourself or others, or have thoughts about taking your own life, get help right away. You can go to your nearest emergency department or call:  Your local emergency services (911 in the [place]  A suicide crisis helpline, such as the National Suicide Prevention Lifeline at 458-258-4370. This is open 24 hours a day. Summary  Perinatal depression is when a woman feels excessive sadness, anger, or anxiety during pregnancy or during the first 12 months after she gives birth.  If perinatal depression is not treated, it can lead to health problems for the mother and her baby.  This condition is treated with medicines, talk therapy, stress reduction therapies, or a combination of two or more treatments.  Talk with your partner or family members about your feelings. Do not be afraid to ask for help. This information is not intended to replace advice given to you by your health care provider. Make sure you discuss any questions you have with your health care provider. Document Revised: 11/22/2018 Document Reviewed: 08/04/2016 Elsevier Patient Education  2020 [employer].

## 2020-03-11 NOTE — Progress Notes (Signed)
  OBSTETRICS POSTPARTUM CLINIC PROGRESS NOTE  Subjective:     Shelby Aguilar is a 34 y.o. [id number] female who presents for a postpartum visit. She is 6 weeks postpartum following a Term pregnancy and delivery by C-section repeat; no problems after deliver.  I have fully reviewed the prenatal and intrapartum course. Anesthesia: spinal.  Postpartum course has been complicated by complicated by pain with intercourse.  Baby is feeding by Breast.  Bleeding: patient has not  resumed menses.  Bowel function is normal. Bladder function is normal.  Patient is sexually active. Contraception method desired is tubal ligation.  Postpartum depression screening: positive. Edinburgh 10.  The following portions of the patient's history were reviewed and updated as appropriate: allergies, current medications, past family history, past medical history, past social history, past surgical history and problem list.  Review of Systems Pertinent items are noted in HPI.  Objective:    BP 120/70   Pulse 70   Resp 18   Ht 5\' 3"  (1.6 m)   Wt 223 lb 12.8 oz (101.5 kg)   SpO2 98%   Breastfeeding Yes   BMI 39.64 kg/m   General:  alert and no distress   Breasts:  inspection negative, no nipple discharge or bleeding, no masses or nodularity palpable  Lungs: clear to auscultation bilaterally  Heart:  regular rate and rhythm, S1, S2 normal, no murmur, click, rub or gallop  Abdomen: soft, non-tender; bowel sounds normal; no masses,  no organomegaly.   Well healed Pfannenstiel incision   Vulva:  normal  Vagina: normal vagina, no discharge, exudate, lesion, or erythema  Cervix:  no cervical motion tenderness and no lesions  Corpus: normal size, contour, position, consistency, mobility, non-tender  Adnexa:  normal adnexa and no mass, fullness, tenderness  Rectal Exam: Not performed.        Patient tender with palpation of her pelvic muscles on bimanual exam. Tenderness of pelvic muscles > uterine or adnexal  tenderness.    Assessment:  Post Partum Care visit 1. History of 2 cesarean sections  Healing well  2. Sleep apnea in adult Resent referral, positive testing during pregnancy - Ambulatory referral to Sleep Studies  3. Diabetes mellitus screening  - Glucose Tolerance, 2 Hours w/1 Hour  4. Pelvic pain in female  - Ambulatory referral to Physical Therapy  5. Dyspareunia in female  - Ambulatory referral to Physical Therapy  6. Acute vaginitis  - NuSwab BV and Candida, NAA  7. Postpartum depression Patient recently restarted Zoloft which has helped. Discussed Zulresso infusion medication as an option if needed.     Plan:  See orders and Patient Instructions Follow up in: 4 weeks or as needed.   MD, Adelene Idler OB/GYN, [facility] 03/11/2020 2:08 PM

## 2020-03-14 LAB — NUSWAB BV AND CANDIDA, NAA
Candida albicans, NAA: NEGATIVE
Candida glabrata, NAA: NEGATIVE

## 2020-03-17 ENCOUNTER — Encounter: Payer: Self-pay | Admitting: Physical Therapy

## 2020-03-17 ENCOUNTER — Other Ambulatory Visit: Payer: Self-pay

## 2020-03-17 ENCOUNTER — Ambulatory Visit: Payer: Managed Care, Other (non HMO) | Attending: Obstetrics and Gynecology | Admitting: Physical Therapy

## 2020-03-17 DIAGNOSIS — R278 Other lack of coordination: Secondary | ICD-10-CM | POA: Diagnosis present

## 2020-03-17 DIAGNOSIS — M62838 Other muscle spasm: Secondary | ICD-10-CM | POA: Insufficient documentation

## 2020-03-17 DIAGNOSIS — R102 Pelvic and perineal pain: Secondary | ICD-10-CM | POA: Diagnosis present

## 2020-03-17 NOTE — Therapy (Signed)
[place] [facility] [address] [place], [place], [address] Phone: [phone]   Fax:  [phone]  Physical Therapy Evaluation  Patient Details  Name: Shelby Aguilar MRN: [id number] Date of Birth: [birth year] Referring Provider (PT): [place], [place]   Encounter Date: 03/17/2020   PT End of Session - 03/17/20 1515    Visit Number 1    Number of Visits 8    Date for PT Re-Evaluation 05/12/20    PT Start Time 1405    PT Stop Time 1500    PT Time Calculation (min) 55 min    Activity Tolerance Patient tolerated treatment well    Behavior During Therapy [facility] for tasks assessed/performed           Past Medical History:  Diagnosis Date  . Anxiety   . Asthma   . B12 deficiency   . Depression   . Family history of breast cancer    1/21 cancer genetic tesitng letter sent  . GERD (gastroesophageal reflux disease)   . Gestational diabetes   . Iron deficiency anemia   . PCOS (polycystic ovarian syndrome)     Past Surgical History:  Procedure Laterality Date  . CESAREAN SECTION  2013  . CESAREAN SECTION N/A 07/25/2018   Procedure: CESAREAN SECTION;  Surgeon: Natale Milch, MD;  Location: [facility] ORS;  Service: Obstetrics;  Laterality: N/A;  . CESAREAN SECTION WITH BILATERAL TUBAL LIGATION Bilateral 01/29/2020   Procedure: CESAREAN SECTION WITH BILATERAL TUBAL LIGATION;  Surgeon: Natale Milch, MD;  Location: [facility] ORS;  Service: Obstetrics;  Laterality: Bilateral;    There were no vitals filed for this visit.        [facility] PT Assessment - 03/17/20 0001      Assessment   Medical Diagnosis Pelvic Pain     Referring Provider (PT) Schuman, Christanna    Hand Dominance Right    Next MD Visit 04/2020    Prior Therapy No      Balance Screen   Has the patient fallen in the past 6 months No          PELVIC HEALTH PHYSICAL THERAPY EVALUATION  SCREENING Red Flags: None Have you had any night  sweats? Unexplained weight loss? Saddle anesthesia? Unexplained changes in bowel or bladder habits?  Precautions: None  SUBJECTIVE  Chief Complaint: Patient states that since her most recent delivery she has had pain with intercourse; patient notes that during her last pregnancy she had a stabbing pain during intercourse, but that resolved after delivery. Patient notes that she has been dealing with UI since 2020 (after the birth of her second child). Patient reports pain now is so severe it prevents her from participating in intimacy with her partner. Patient does have occasional stabbing pain in the pelvis without penetration and with no known cause. Patient notes that with increased coughing/laughing/nseezing she has UI as well burning.   Pertinent History:  Falls Negative.  Scoliosis Negative. Pulmonary disease/dysfunction Positive for asthma. Surgical history: Positive for c-section x2.   Recent Procedures/Tests/Findings: negative for UTI  Obstetrical History: G3P3 Deliveries: c-section   Gynecological History: Endometriosis: Positive for PCOS Pain with exam: Yes   Urinary History: Incontinence: Positive. Onset: 2020 Triggers: coughing/laughing/sneezing; some urgency. Poise pad: 1/day. Amount: Mod. Fluid Intake: 64 oz+ H20, 1 cup coffee caffeinated, 2 cups of juices/sodas Nocturia: 2-3x/night Frequency of urination: every 1-2 hours Pain with urination: Positive for very often burning. Difficulty initiating urination: Negative Frequent UTI: Negative.  Patient  notes quick urination with complete emptying and pushing.  Gastrointestinal History: Bristol Stool Chart: Type 2/3/4 Frequency of BMs: 3x/day Pain with defecation: Negative Straining with defecation: Positive for occasional. Patient notes that since this pregnancy she requires increased time to empty. Incontinence: Positive for flatus. Onset: 2020 Triggers: coughing/laughing/sneezing Amount: Min.  Sexual  activity/pain: Pain with intercourse: Positive.   Initial penetration: Yes  Deep thrustingNo  Pain with external stimulation: Yes; near clitoris and introitus.  Location of pain: external PFM (top triangle and vaginal opening) Current pain:  0/10  Max pain:  10/10 Least pain: 0 /10 Pain quality: pain quality: burning, stabbing and shredded Radiating pain: No    Patient assessment of present state: "Kids"  Current activities for pleasure:  Naps; pedicures  Patient Goals:  Sexual intimacy; wear menstrual cup; not wear a poise every day  Patient perception of overall health: Good  OBJECTIVE  Mental Status Patient is oriented to person, place and time.  Recent memory is intact.  Remote memory is intact.  Attention span and concentration are intact.  Expressive speech is intact.  Patient's fund of knowledge is within normal limits for educational level.  POSTURE/OBSERVATIONS:  Lumbar lordosis: mildly increased commensurate with post-partum stage Iliac crest height: appearing equal bilaterally Lumbar lateral shift: negative Pelvic obliquity: negative   GAIT: Wide based gait; hyperextension at thoracolumbar jxn; increased lordosis   RANGE OF MOTION: deferred 2/2 to time constraints   LEFT RIGHT  Lumbar forward flexion (65):      Lumbar extension (30):     Lumbar lateral flexion (25):     Thoracic and Lumbar rotation (30 degrees):       Hip Flexion (0-125):      Hip IR (0-45):     Hip ER (0-45):     Hip Abduction (0-40):     Hip extension (0-15):       SENSATION: deferred 2/2 to time constraints   STRENGTH: MMT deferred 2/2 to time constraints  RLE LLE  Hip Flexion    Hip Extension    Hip Abduction     Hip Adduction     Hip ER     Hip IR     Knee Extension    Knee Flexion    Dorsiflexion     Plantarflexion (seated)     ABDOMINAL: deferred 2/2 to time constraints Palpation: Diastasis: Scar mobility: Rib flare:  SPECIAL TESTS: deferred 2/2 to  time constraints  PHYSICAL PERFORMANCE MEASURES: STS: WNL   EXTERNAL PELVIC EXAM: deferred 2/2 to time constraints Palpation: Breath coordination: Cued Lengthen: Cued Contraction: Cough:  INTERNAL VAGINAL EXAM: deferred 2/2 to time constraints Introitus Appears:  Skin integrity:  Scar mobility: Strength (PERF):  Symmetry: Palpation: Prolapse:   INTERNAL RECTAL EXAM: not indicated Strength (PERF): Symmetry: Palpation: Prolapse:   OUTCOME MEASURES: FOTO (PFDI Pain 21; Urinary Problem 51)   ASSESSMENT Patient is a 34 year old presenting to clinic with chief complaints of urinary incontinence and pelvic pain. Upon examination, patient demonstrates deficits in PFM function, PFM coordination, IAP management, posture, pain as evidenced by 10/10 pain with penetration, pain with urination, SUI, frequent urination with inability to delay. Patient's responses on FOTO outcome measures (PFDI Pain 21; Urinary Problem 51) indicate moderate functional limitations/disability/distress. Patient's progress may be limited due to time demands of infant caregiving; however, patient's motivation is advantageous. Patient was able to achieve basic understanding of PFM functions as well as fear-avoidance cycle for pelvic pain during today's evaluation and responded positively to educational  interventions. Patient will benefit from continued skilled therapeutic intervention to address deficits in PFM function, PFM coordination, IAP management, posture, pain in order to increase function, and improve overall QOL.  EDUCATION Patient educated on prognosis, POC, and provided with HEP including: diaphragmatic breathing. Patient articulated understanding and returned demonstration. Patient will benefit from further education in order to maximize compliance and understanding for long-term therapeutic gains.  TREATMENT Neuromuscular Re-education: Patient educated on primary functions of the pelvic floor  including: posture/balance, sexual pleasure, storage and elimination of waste from the body, abdominal cavity closure, and breath coordination. Patient educated on fear avoidance cycle of pelvic pain/pain with penetration.    Objective measurements completed on examination: See above findings.          PT Long Term Goals - 03/17/20 1534      PT LONG TERM GOAL #1   Title Patient will demonstrate independence with HEP in order to maximize therapeutic gains and improve carryover from physical therapy sessions to ADLs in the home and community.    Baseline IE: not initiated    Time 8    Period Weeks    Status New    Target Date 05/12/20      PT LONG TERM GOAL #2   Title Patient will decrease worst pain as reported on NPRS by at least 2 points to demonstrate clinically significant reduction in pain in order to restore/improve function and overall QOL.    Baseline IE: 10/10 pain with penetration    Time 8    Period Weeks    Status New    Target Date 05/12/20      PT LONG TERM GOAL #3   Title Patient will demonstrate improved function as evidenced by a score of 66 on FOTO measure for full participation in activities at home and in the community.    Baseline IE: 51    Time 8    Period Weeks    Status New    Target Date 05/12/20      PT LONG TERM GOAL #4   Title Patient will report use of pantyliners/no protective undergarment and <3 incidents of stress urinary incontinence over the course of 1 week while coughing/sneezing/laughing/prolonged activity in order to demonstrate improved PFM coordination, strength, and function for improved overall QOL.    Baseline IE: poise pad/day    Time 8    Period Weeks    Status New    Target Date 05/12/20      PT LONG TERM GOAL #5   Title Patient will report being able to return to activities including, but not limited to: menstrual cup and intercourse without pain or limitation to indicate complete resolution of the chief complaint and  return to prior level of participation at home and in the community.    Baseline IE: 10/10 pain with penetration    Time 8    Period Weeks    Status New    Target Date 05/12/20                  Plan - 03/17/20 1536    Clinical Impression Statement Patient is a 34 year old presenting to clinic with chief complaints of urinary incontinence and pelvic pain. Upon examination, patient demonstrates deficits in PFM function, PFM coordination, IAP management, posture, pain as evidenced by 10/10 pain with penetration, pain with urination, SUI, frequent urination with inability to delay. Patient's responses on FOTO outcome measures (PFDI Pain 21; Urinary Problem 51) indicate moderate functional limitations/disability/distress.  Patient's progress may be limited due to time demands of infant caregiving; however, patient's motivation is advantageous. Patient was able to achieve basic understanding of PFM functions as well as fear-avoidance cycle for pelvic pain during today's evaluation and responded positively to educational interventions. Patient will benefit from continued skilled therapeutic intervention to address deficits in PFM function, PFM coordination, IAP management, posture, pain in order to increase function, and improve overall QOL.    Personal Factors and Comorbidities Behavior Pattern;Comorbidity 3+;Past/Current Experience;Time since onset of injury/illness/exacerbation;Sex    Comorbidities asthma, depression, GERD, PCOS, hx of cesarean section x2, anemia, anxiety    Examination-Activity Limitations Caring for Others;Other;Sleep;Continence    Examination-Participation Restrictions Interpersonal Relationship;Shop;Laundry;Cleaning;Community Activity    Stability/Clinical Decision Making Evolving/Moderate complexity    Clinical Decision Making Moderate    Rehab Potential Good    PT Frequency 1x / week    PT Duration 8 weeks    PT Treatment/Interventions ADLs/Self Care Home  Management;Aquatic Therapy;Moist Heat;Cryotherapy;Electrical Stimulation;Neuromuscular re-education;Therapeutic exercise;Therapeutic activities;Manual techniques;Scar mobilization;Taping;Dry needling;Spinal Manipulations;Joint Manipulations;Passive range of motion;Patient/family education;Orthotic Fit/Training    PT Next Visit Plan External PFM assessment; PFM lengthening    PT Home Exercise Plan diaphragmatic breathing    Consulted and Agree with Plan of Care Patient           Patient will benefit from skilled therapeutic intervention in order to improve the following deficits and impairments:  Abnormal gait, Decreased endurance, Improper body mechanics, Postural dysfunction, Pain, Impaired flexibility, Increased fascial restricitons, Decreased strength, Decreased coordination, Decreased range of motion, Decreased scar mobility  Visit Diagnosis: Pelvic pain  Other lack of coordination  Other muscle spasm     Problem List Patient Active Problem List   Diagnosis Date Noted  . Iron deficiency anemia 02/12/2020  . Single liveborn infant, delivered by cesarean   . Encounter for care or examination of lactating mother   . Postpartum care following cesarean delivery   . Supervision of high risk pregnancy in third trimester 01/29/2020  . Insulin controlled gestational diabetes mellitus (GDM) in third trimester   . GERD (gastroesophageal reflux disease) 01/22/2020  . Labor and delivery, indication for care 01/07/2020  . [redacted] weeks gestation of pregnancy   . Thyromegaly 12/24/2019  . B12 deficiency 12/19/2019  . Normocytic anemia 12/17/2019  . Severe persistent asthma without complication 11/14/2019  . Amenorrhea 10/16/2019  . Pregestational diabetes mellitus, modified White class B 08/02/2019  . BMI 39.0-39.9,adult 07/19/2019  . History of 2 cesarean sections 07/10/2019  . Obesity affecting pregnancy, antepartum 11/19/2017  . Supervision of high risk pregnancy, antepartum 11/19/2017   . PCOD (polycystic ovarian disease) 10/22/2014  . GOITER, UNSPECIFIED 05/03/2007   Sheria Lang PT, DPT [phone]  03/17/2020, 3:40 PM  [place] [facility] [address] [place], [place], [address] Phone: [phone]   Fax:  [phone]  Name: Shelby Aguilar MRN: [id number] Date of Birth: [birth year]

## 2020-03-23 ENCOUNTER — Other Ambulatory Visit: Payer: Self-pay | Admitting: Obstetrics and Gynecology

## 2020-03-24 ENCOUNTER — Encounter: Payer: Self-pay | Admitting: Physical Therapy

## 2020-03-24 ENCOUNTER — Ambulatory Visit: Payer: Managed Care, Other (non HMO) | Attending: Obstetrics and Gynecology | Admitting: Physical Therapy

## 2020-03-24 ENCOUNTER — Other Ambulatory Visit: Payer: Self-pay

## 2020-03-24 DIAGNOSIS — R102 Pelvic and perineal pain: Secondary | ICD-10-CM | POA: Insufficient documentation

## 2020-03-24 DIAGNOSIS — M62838 Other muscle spasm: Secondary | ICD-10-CM | POA: Insufficient documentation

## 2020-03-24 DIAGNOSIS — R278 Other lack of coordination: Secondary | ICD-10-CM | POA: Insufficient documentation

## 2020-03-24 NOTE — Therapy (Signed)
[facility] [address] [place], [place], [address] Phone: [phone]   Fax:  [phone]  Physical Therapy Treatment  Patient Details  Name: Shelby Aguilar MRN: [id number] Date of Birth: [birth year] Referring Provider (PT): Adelene Idler   Encounter Date: 03/24/2020   PT End of Session - 03/24/20 1303    Visit Number 2    Number of Visits 8    Date for PT Re-Evaluation 05/12/20    PT Start Time 1300    PT Stop Time 1355    PT Time Calculation (min) 55 min    Activity Tolerance Patient tolerated treatment well    Behavior During Therapy [facility] for tasks assessed/performed           Past Medical History:  Diagnosis Date  . Anxiety   . Asthma   . B12 deficiency   . Depression   . Family history of breast cancer    1/21 cancer genetic tesitng letter sent  . GERD (gastroesophageal reflux disease)   . Gestational diabetes   . Iron deficiency anemia   . PCOS (polycystic ovarian syndrome)     Past Surgical History:  Procedure Laterality Date  . CESAREAN SECTION  2013  . CESAREAN SECTION N/A 07/25/2018   Procedure: CESAREAN SECTION;  Surgeon: Natale Milch, MD;  Location: [facility] ORS;  Service: Obstetrics;  Laterality: N/A;  . CESAREAN SECTION WITH BILATERAL TUBAL LIGATION Bilateral 01/29/2020   Procedure: CESAREAN SECTION WITH BILATERAL TUBAL LIGATION;  Surgeon: Natale Milch, MD;  Location: [facility] ORS;  Service: Obstetrics;  Laterality: Bilateral;    There were no vitals filed for this visit.   Subjective Assessment - 03/24/20 1302    Subjective Patient notes that she has had a busy day; she had an adjustment orm a chiropractor this morning which felt great. Patient notes no significant changes since last visit.    Currently in Pain? No/denies          TREATMENT  Pre-treatment assessment: L IC significantly elevated  Stork (-) B EXTERNAL PELVIC EXAM: Patient educated on the purpose of the  pelvic exam and articulated understanding; patient consented to the exam verbally. Palpation: TTP at top of triangle (9 o'clock to 3 o'clock) Breath coordination: present minimally Cued Lengthen: abdominal compensations Cued Contraction: 4/5 MMT Cough: no PFM movement  Neuromuscular Re-education: Supine hooklying diaphragmatic breathing with VCs and TCs for downregulation of the nervous system and improved management of IAP Supine hooklying, PFM lengthening with inhalation. VCs and TCs to decrease compensatory patterns and encourage optimal relaxation of the PFM. Supine hooklying, anterior pelvic tilt with PFM lengthening with inhalation. VCs and TCs to decrease compensatory patterns and encourage optimal relaxation of the PFM. Seated PFM lengthening with inhalation (with and without wash cloth roll feedback). VCs and TCs to decrease compensatory patterns and encourage optimal relaxation of the PFM. Seated PFM lengthening with anterior pelvic tilt for improved stretch.  Patient education on use of heel lift for improved posture and distribution of forces through the pelvis.   Patient educated throughout session on appropriate technique and form using multi-modal cueing, HEP, and activity modification. Patient articulated understanding and returned demonstration.  Patient Response to interventions: Patient confident with HEP.  ASSESSMENT Patient presents to clinic with excellent motivation to participate in therapy. Patient demonstrates deficits in  PFM function, PFM coordination, IAP management, posture, pain. Patient able to achieve palpable PFM lengthening with coordinated breath during today's session and responded positively to active interventions.  Patient will benefit from continued skilled therapeutic intervention to address remaining deficits in  PFM function, PFM coordination, IAP management, posture, pain in order to increase function and improve overall QOL.    PT Long Term Goals  - 03/17/20 1534      PT LONG TERM GOAL #1   Title Patient will demonstrate independence with HEP in order to maximize therapeutic gains and improve carryover from physical therapy sessions to ADLs in the home and community.    Baseline IE: not initiated    Time 8    Period Weeks    Status New    Target Date 05/12/20      PT LONG TERM GOAL #2   Title Patient will decrease worst pain as reported on NPRS by at least 2 points to demonstrate clinically significant reduction in pain in order to restore/improve function and overall QOL.    Baseline IE: 10/10 pain with penetration    Time 8    Period Weeks    Status New    Target Date 05/12/20      PT LONG TERM GOAL #3   Title Patient will demonstrate improved function as evidenced by a score of 66 on FOTO measure for full participation in activities at home and in the community.    Baseline IE: 51    Time 8    Period Weeks    Status New    Target Date 05/12/20      PT LONG TERM GOAL #4   Title Patient will report use of pantyliners/no protective undergarment and <3 incidents of stress urinary incontinence over the course of 1 week while coughing/sneezing/laughing/prolonged activity in order to demonstrate improved PFM coordination, strength, and function for improved overall QOL.    Baseline IE: poise pad/day    Time 8    Period Weeks    Status New    Target Date 05/12/20      PT LONG TERM GOAL #5   Title Patient will report being able to return to activities including, but not limited to: menstrual cup and intercourse without pain or limitation to indicate complete resolution of the chief complaint and return to prior level of participation at home and in the community.    Baseline IE: 10/10 pain with penetration    Time 8    Period Weeks    Status New    Target Date 05/12/20                 Plan - 03/24/20 1304    Clinical Impression Statement Patient presents to clinic with excellent motivation to participate in  therapy. Patient demonstrates deficits in  PFM function, PFM coordination, IAP management, posture, pain. Patient able to achieve palpable PFM lengthening with coordinated breath during today's session and responded positively to active interventions. Patient will benefit from continued skilled therapeutic intervention to address remaining deficits in  PFM function, PFM coordination, IAP management, posture, pain in order to increase function and improve overall QOL.    Personal Factors and Comorbidities Behavior Pattern;Comorbidity 3+;Past/Current Experience;Time since onset of injury/illness/exacerbation;Sex    Comorbidities asthma, depression, GERD, PCOS, hx of cesarean section x2, anemia, anxiety    Examination-Activity Limitations Caring for Others;Other;Sleep;Continence    Examination-Participation Restrictions Interpersonal Relationship;Shop;Laundry;Cleaning;Community Activity    Stability/Clinical Decision Making Evolving/Moderate complexity    Rehab Potential Good    PT Frequency 1x / week    PT Duration 8 weeks    PT Treatment/Interventions ADLs/Self Care Home Management;Aquatic Therapy;Moist Heat;Cryotherapy;Electrical Stimulation;Neuromuscular  re-education;Therapeutic exercise;Therapeutic activities;Manual techniques;Scar mobilization;Taping;Dry needling;Spinal Manipulations;Joint Manipulations;Passive range of motion;Patient/family education;Orthotic Fit/Training    PT Next Visit Plan fascial release PFM lengthening    PT Home Exercise Plan diaphragmatic breathing    Consulted and Agree with Plan of Care Patient           Patient will benefit from skilled therapeutic intervention in order to improve the following deficits and impairments:  Abnormal gait, Decreased endurance, Improper body mechanics, Postural dysfunction, Pain, Impaired flexibility, Increased fascial restricitons, Decreased strength, Decreased coordination, Decreased range of motion, Decreased scar mobility  Visit  Diagnosis: Pelvic pain  Other lack of coordination  Other muscle spasm     Problem List Patient Active Problem List   Diagnosis Date Noted  . Iron deficiency anemia 02/12/2020  . Single liveborn infant, delivered by cesarean   . Encounter for care or examination of lactating mother   . Postpartum care following cesarean delivery   . Supervision of high risk pregnancy in third trimester 01/29/2020  . Insulin controlled gestational diabetes mellitus (GDM) in third trimester   . GERD (gastroesophageal reflux disease) 01/22/2020  . Labor and delivery, indication for care 01/07/2020  . [redacted] weeks gestation of pregnancy   . Thyromegaly 12/24/2019  . B12 deficiency 12/19/2019  . Normocytic anemia 12/17/2019  . Severe persistent asthma without complication 11/14/2019  . Amenorrhea 10/16/2019  . Pregestational diabetes mellitus, modified White class B 08/02/2019  . BMI 39.0-39.9,adult 07/19/2019  . History of 2 cesarean sections 07/10/2019  . Obesity affecting pregnancy, antepartum 11/19/2017  . Supervision of high risk pregnancy, antepartum 11/19/2017  . PCOD (polycystic ovarian disease) 10/22/2014  . GOITER, UNSPECIFIED 05/03/2007   Sheria Lang PT, DPT [phone]  03/24/2020, 2:17 PM  [place] [facility] [address] [place], [place], [address] Phone: [phone]   Fax:  [phone]  Name: Shelby Aguilar MRN: [id number] Date of Birth: 02/03/1986

## 2020-03-31 ENCOUNTER — Ambulatory Visit: Payer: Managed Care, Other (non HMO) | Admitting: Physical Therapy

## 2020-03-31 ENCOUNTER — Other Ambulatory Visit: Payer: Self-pay

## 2020-03-31 ENCOUNTER — Encounter: Payer: Self-pay | Admitting: Physical Therapy

## 2020-03-31 DIAGNOSIS — R278 Other lack of coordination: Secondary | ICD-10-CM

## 2020-03-31 DIAGNOSIS — M62838 Other muscle spasm: Secondary | ICD-10-CM

## 2020-03-31 DIAGNOSIS — R102 Pelvic and perineal pain: Secondary | ICD-10-CM | POA: Diagnosis not present

## 2020-03-31 NOTE — Therapy (Signed)
[facility] [address] [place], [place], [address] Phone: [phone]   Fax:  [phone]  Physical Therapy Treatment  Patient Details  Name: Shelby Aguilar MRN: [id number] Date of Birth: [birth year] Referring Provider (PT): Adelene Idler   Encounter Date: 03/31/2020   PT End of Session - 03/31/20 1259    Visit Number 3    Number of Visits 8    Date for PT Re-Evaluation 05/12/20    PT Start Time 1300    PT Stop Time 1355    PT Time Calculation (min) 55 min    Activity Tolerance Patient tolerated treatment well    Behavior During Therapy [facility] for tasks assessed/performed           Past Medical History:  Diagnosis Date  . Anxiety   . Asthma   . B12 deficiency   . Depression   . Family history of breast cancer    1/21 cancer genetic tesitng letter sent  . GERD (gastroesophageal reflux disease)   . Gestational diabetes   . Iron deficiency anemia   . PCOS (polycystic ovarian syndrome)     Past Surgical History:  Procedure Laterality Date  . CESAREAN SECTION  2013  . CESAREAN SECTION N/A 07/25/2018   Procedure: CESAREAN SECTION;  Surgeon: Natale Milch, MD;  Location: [facility] ORS;  Service: Obstetrics;  Laterality: N/A;  . CESAREAN SECTION WITH BILATERAL TUBAL LIGATION Bilateral 01/29/2020   Procedure: CESAREAN SECTION WITH BILATERAL TUBAL LIGATION;  Surgeon: Natale Milch, MD;  Location: [facility] ORS;  Service: Obstetrics;  Laterality: Bilateral;    There were no vitals filed for this visit.   Subjective Assessment - 03/31/20 1301    Subjective Patient notes that she has not had any burning senesation. Patient notes exercises getting easier and notes that throughout the day she is attempting to release her PFM. Patient also adds that she is trying to avoid power peeing. Patient also notes that she coughed a lot yesterday and did not have any UI.    Currently in Pain? No/denies            TREATMENT  Neuromuscular Re-education: Supine hooklying diaphragmatic breathing with VCs and TCs for downregulation of the nervous system and improved management of IAP Supine hooklying, PFM lengthening with inhalation. VCs and TCs to decrease compensatory patterns and encourage optimal relaxation of the PFM. Supine knee to chest with PFM lengthening, BLE, for improved PFM spasm release Supine double knee to chest with PFM lengthening, BLE, for improved PFM spasm release Supine butterfly with PFM lengthening, BLE, for improved PFM spasm release Child's Pose with PFM lengthening, for improved PFM spasm release Kneeling TrA activation for pubic symphysis stabilization and decreased pain Patient education on pain management pre and post penetration, circular model of female sexual response, and positions for penetration that may be less aggravating/pain provoking.   Patient educated throughout session on appropriate technique and form using multi-modal cueing, HEP, and activity modification. Patient articulated understanding and returned demonstration.  Patient Response to interventions: Patient comfortable with HEP.  ASSESSMENT Patient presents to clinic with excellent motivation to participate in therapy. Patient demonstrates deficits in  PFM function, PFM coordination, IAP management, posture, pain. Patient had some symptom onset with unilateral PFM stretches coupled with PFM lengthening during today's session, but responded positively to educational interventions. Patient will benefit from continued skilled therapeutic intervention to address remaining deficits in  PFM function, PFM coordination, IAP management, posture, pain in order to  increase function and improve overall QOL.      PT Long Term Goals - 03/17/20 1534      PT LONG TERM GOAL #1   Title Patient will demonstrate independence with HEP in order to maximize therapeutic gains and improve carryover from physical therapy  sessions to ADLs in the home and community.    Baseline IE: not initiated    Time 8    Period Weeks    Status New    Target Date 05/12/20      PT LONG TERM GOAL #2   Title Patient will decrease worst pain as reported on NPRS by at least 2 points to demonstrate clinically significant reduction in pain in order to restore/improve function and overall QOL.    Baseline IE: 10/10 pain with penetration    Time 8    Period Weeks    Status New    Target Date 05/12/20      PT LONG TERM GOAL #3   Title Patient will demonstrate improved function as evidenced by a score of 66 on FOTO measure for full participation in activities at home and in the community.    Baseline IE: 51    Time 8    Period Weeks    Status New    Target Date 05/12/20      PT LONG TERM GOAL #4   Title Patient will report use of pantyliners/no protective undergarment and <3 incidents of stress urinary incontinence over the course of 1 week while coughing/sneezing/laughing/prolonged activity in order to demonstrate improved PFM coordination, strength, and function for improved overall QOL.    Baseline IE: poise pad/day    Time 8    Period Weeks    Status New    Target Date 05/12/20      PT LONG TERM GOAL #5   Title Patient will report being able to return to activities including, but not limited to: menstrual cup and intercourse without pain or limitation to indicate complete resolution of the chief complaint and return to prior level of participation at home and in the community.    Baseline IE: 10/10 pain with penetration    Time 8    Period Weeks    Status New    Target Date 05/12/20                 Plan - 03/31/20 1259    Clinical Impression Statement Patient presents to clinic with excellent motivation to participate in therapy. Patient demonstrates deficits in  PFM function, PFM coordination, IAP management, posture, pain. Patient had some symptom onset with unilateral PFM stretches coupled with PFM  lengthening during today's session, but responded positively to educational interventions. Patient will benefit from continued skilled therapeutic intervention to address remaining deficits in  PFM function, PFM coordination, IAP management, posture, pain in order to increase function and improve overall QOL.    Personal Factors and Comorbidities Behavior Pattern;Comorbidity 3+;Past/Current Experience;Time since onset of injury/illness/exacerbation;Sex    Comorbidities asthma, depression, GERD, PCOS, hx of cesarean section x2, anemia, anxiety    Examination-Activity Limitations Caring for Others;Other;Sleep;Continence    Examination-Participation Restrictions Interpersonal Relationship;Shop;Laundry;Cleaning;Community Activity    Stability/Clinical Decision Making Evolving/Moderate complexity    Rehab Potential Good    PT Frequency 1x / week    PT Duration 8 weeks    PT Treatment/Interventions ADLs/Self Care Home Management;Aquatic Therapy;Moist Heat;Cryotherapy;Electrical Stimulation;Neuromuscular re-education;Therapeutic exercise;Therapeutic activities;Manual techniques;Scar mobilization;Taping;Dry needling;Spinal Manipulations;Joint Manipulations;Passive range of motion;Patient/family education;Orthotic Fit/Training    PT Next Visit Plan  fascial release PFM lengthening    PT Home Exercise Plan diaphragmatic breathing    Consulted and Agree with Plan of Care Patient           Patient will benefit from skilled therapeutic intervention in order to improve the following deficits and impairments:  Abnormal gait, Decreased endurance, Improper body mechanics, Postural dysfunction, Pain, Impaired flexibility, Increased fascial restricitons, Decreased strength, Decreased coordination, Decreased range of motion, Decreased scar mobility  Visit Diagnosis: Pelvic pain  Other lack of coordination  Other muscle spasm     Problem List Patient Active Problem List   Diagnosis Date Noted  . Iron  deficiency anemia 02/12/2020  . Single liveborn infant, delivered by cesarean   . Encounter for care or examination of lactating mother   . Postpartum care following cesarean delivery   . Supervision of high risk pregnancy in third trimester 01/29/2020  . Insulin controlled gestational diabetes mellitus (GDM) in third trimester   . GERD (gastroesophageal reflux disease) 01/22/2020  . Labor and delivery, indication for care 01/07/2020  . [redacted] weeks gestation of pregnancy   . Thyromegaly 12/24/2019  . B12 deficiency 12/19/2019  . Normocytic anemia 12/17/2019  . Severe persistent asthma without complication 11/14/2019  . Amenorrhea 10/16/2019  . Pregestational diabetes mellitus, modified White class B 08/02/2019  . BMI 39.0-39.9,adult 07/19/2019  . History of 2 cesarean sections 07/10/2019  . Obesity affecting pregnancy, antepartum 11/19/2017  . Supervision of high risk pregnancy, antepartum 11/19/2017  . PCOD (polycystic ovarian disease) 10/22/2014  . GOITER, UNSPECIFIED 05/03/2007   Sheria Lang PT, DPT [phone]  03/31/2020, 2:40 PM  [place] [facility] [address] [place], [place], [address] Phone: [phone]   Fax:  [phone]  Name: Shelby Aguilar MRN: [id number] Date of Birth: 11-Aug-1985

## 2020-04-07 ENCOUNTER — Encounter: Payer: Managed Care, Other (non HMO) | Admitting: Physical Therapy

## 2020-04-08 ENCOUNTER — Encounter: Payer: Self-pay | Admitting: Physical Therapy

## 2020-04-08 ENCOUNTER — Inpatient Hospital Stay: Payer: Managed Care, Other (non HMO) | Attending: Hematology and Oncology

## 2020-04-08 ENCOUNTER — Ambulatory Visit: Payer: Managed Care, Other (non HMO) | Admitting: Physical Therapy

## 2020-04-08 ENCOUNTER — Other Ambulatory Visit: Payer: Self-pay

## 2020-04-08 DIAGNOSIS — R102 Pelvic and perineal pain: Secondary | ICD-10-CM

## 2020-04-08 DIAGNOSIS — E538 Deficiency of other specified B group vitamins: Secondary | ICD-10-CM | POA: Insufficient documentation

## 2020-04-08 DIAGNOSIS — R278 Other lack of coordination: Secondary | ICD-10-CM

## 2020-04-08 DIAGNOSIS — M62838 Other muscle spasm: Secondary | ICD-10-CM

## 2020-04-08 NOTE — Therapy (Signed)
[facility] [address] [place], [place], [address] Phone: [phone]   Fax:  [phone]  Physical Therapy Treatment  Patient Details  Name: Shelby Aguilar MRN: [id number] Date of Birth: 10/19/1985 Referring Provider (PT): Adelene Idler   Encounter Date: 04/08/2020   PT End of Session - 04/08/20 9417    Visit Number 4    Number of Visits 8    Date for PT Re-Evaluation 05/12/20    PT Start Time 0802    PT Stop Time 0855    PT Time Calculation (min) 53 min    Activity Tolerance Patient tolerated treatment well    Behavior During Therapy [facility] for tasks assessed/performed           Past Medical History:  Diagnosis Date  . Anxiety   . Asthma   . B12 deficiency   . Depression   . Family history of breast cancer    1/21 cancer genetic tesitng letter sent  . GERD (gastroesophageal reflux disease)   . Gestational diabetes   . Iron deficiency anemia   . PCOS (polycystic ovarian syndrome)     Past Surgical History:  Procedure Laterality Date  . CESAREAN SECTION  2013  . CESAREAN SECTION N/A 07/25/2018   Procedure: CESAREAN SECTION;  Surgeon: Natale Milch, MD;  Location: [facility] ORS;  Service: Obstetrics;  Laterality: N/A;  . CESAREAN SECTION WITH BILATERAL TUBAL LIGATION Bilateral 01/29/2020   Procedure: CESAREAN SECTION WITH BILATERAL TUBAL LIGATION;  Surgeon: Natale Milch, MD;  Location: [facility] ORS;  Service: Obstetrics;  Laterality: Bilateral;    There were no vitals filed for this visit.   Subjective Assessment - 04/08/20 0804    Subjective Patient notes that she had a nice weekend away. Patient has been able to go without a poise pad for over a week. She continues to develop PFM awareness with relaxation emphasis throughout the day. Patient also notes that she was able to be intimate with her husband and participate in penetrative sex without pain/limitation. Patient notes she had no residual  soreness after intercourse.    Currently in Pain? No/denies          TREATMENT Manual Therapy: Patient educated on the purpose of the pelvic exam and articulated understanding; patient consented to the exam verbally. MFR/TPR of PFM at 9-11 o'clock and 1-2 o'clock for decreased pain and spasm MFR of abdomen for decreased tension in urogenital triangle and improved tissue extensibility  Neuromuscular Re-education: Supine hooklying diaphragmatic breathing with VCs and TCs for downregulation of the nervous system and improved management of IAP Supine hooklying, PFM lengthening with inhalation. VCs and TCs to decrease compensatory patterns and encourage optimal relaxation of the PFM. Patient educated extensively on PFM anatomy and muscular and nerve contributions to symptoms.  Patient educated throughout session on appropriate technique and form using multi-modal cueing, HEP, and activity modification. Patient articulated understanding and returned demonstration.  Patient Response to interventions: Patient comfortable with HEP addition of fascial release.  ASSESSMENT Patient presents to clinic with excellent motivation to participate in therapy. Patient demonstrates deficits in PFM function, PFM coordination, IAP management, posture, pain. Patient had concordant symptoms/pain with MFR of superior portions of urogenital triangle during today's session, but responded positively to educational interventions. Patient will benefit from continued skilled therapeutic intervention to address remaining deficits in  PFM function, PFM coordination, IAP management, posture, pain in order to increase function and improve overall QOL.       PT  Long Term Goals - 03/17/20 1534      PT LONG TERM GOAL #1   Title Patient will demonstrate independence with HEP in order to maximize therapeutic gains and improve carryover from physical therapy sessions to ADLs in the home and community.    Baseline IE: not  initiated    Time 8    Period Weeks    Status New    Target Date 05/12/20      PT LONG TERM GOAL #2   Title Patient will decrease worst pain as reported on NPRS by at least 2 points to demonstrate clinically significant reduction in pain in order to restore/improve function and overall QOL.    Baseline IE: 10/10 pain with penetration    Time 8    Period Weeks    Status New    Target Date 05/12/20      PT LONG TERM GOAL #3   Title Patient will demonstrate improved function as evidenced by a score of 66 on FOTO measure for full participation in activities at home and in the community.    Baseline IE: 51    Time 8    Period Weeks    Status New    Target Date 05/12/20      PT LONG TERM GOAL #4   Title Patient will report use of pantyliners/no protective undergarment and <3 incidents of stress urinary incontinence over the course of 1 week while coughing/sneezing/laughing/prolonged activity in order to demonstrate improved PFM coordination, strength, and function for improved overall QOL.    Baseline IE: poise pad/day    Time 8    Period Weeks    Status New    Target Date 05/12/20      PT LONG TERM GOAL #5   Title Patient will report being able to return to activities including, but not limited to: menstrual cup and intercourse without pain or limitation to indicate complete resolution of the chief complaint and return to prior level of participation at home and in the community.    Baseline IE: 10/10 pain with penetration    Time 8    Period Weeks    Status New    Target Date 05/12/20                 Plan - 04/08/20 2703    Clinical Impression Statement Patient presents to clinic with excellent motivation to participate in therapy. Patient demonstrates deficits in PFM function, PFM coordination, IAP management, posture, pain. Patient had concordant symptoms/pain with MFR of superior portions of urogenital triangle during today's session, but responded positively to  educational interventions. Patient will benefit from continued skilled therapeutic intervention to address remaining deficits in  PFM function, PFM coordination, IAP management, posture, pain in order to increase function and improve overall QOL.    Personal Factors and Comorbidities Behavior Pattern;Comorbidity 3+;Past/Current Experience;Time since onset of injury/illness/exacerbation;Sex    Comorbidities asthma, depression, GERD, PCOS, hx of cesarean section x2, anemia, anxiety    Examination-Activity Limitations Caring for Others;Other;Sleep;Continence    Examination-Participation Restrictions Interpersonal Relationship;Shop;Laundry;Cleaning;Community Activity    Stability/Clinical Decision Making Evolving/Moderate complexity    Rehab Potential Good    PT Frequency 1x / week    PT Duration 8 weeks    PT Treatment/Interventions ADLs/Self Care Home Management;Aquatic Therapy;Moist Heat;Cryotherapy;Electrical Stimulation;Neuromuscular re-education;Therapeutic exercise;Therapeutic activities;Manual techniques;Scar mobilization;Taping;Dry needling;Spinal Manipulations;Joint Manipulations;Passive range of motion;Patient/family education;Orthotic Fit/Training    PT Next Visit Plan PFM MFR; Alcock's canal release    PT Home Exercise Plan diaphragmatic breathing  Consulted and Agree with Plan of Care Patient           Patient will benefit from skilled therapeutic intervention in order to improve the following deficits and impairments:  Abnormal gait, Decreased endurance, Improper body mechanics, Postural dysfunction, Pain, Impaired flexibility, Increased fascial restricitons, Decreased strength, Decreased coordination, Decreased range of motion, Decreased scar mobility  Visit Diagnosis: Pelvic pain  Other lack of coordination  Other muscle spasm     Problem List Patient Active Problem List   Diagnosis Date Noted  . Iron deficiency anemia 02/12/2020  . Single liveborn infant, delivered  by cesarean   . Encounter for care or examination of lactating mother   . Postpartum care following cesarean delivery   . Supervision of high risk pregnancy in third trimester 01/29/2020  . Insulin controlled gestational diabetes mellitus (GDM) in third trimester   . GERD (gastroesophageal reflux disease) 01/22/2020  . Labor and delivery, indication for care 01/07/2020  . [redacted] weeks gestation of pregnancy   . Thyromegaly 12/24/2019  . B12 deficiency 12/19/2019  . Normocytic anemia 12/17/2019  . Severe persistent asthma without complication 11/14/2019  . Amenorrhea 10/16/2019  . Pregestational diabetes mellitus, modified White class B 08/02/2019  . BMI 39.0-39.9,adult 07/19/2019  . History of 2 cesarean sections 07/10/2019  . Obesity affecting pregnancy, antepartum 11/19/2017  . Supervision of high risk pregnancy, antepartum 11/19/2017  . PCOD (polycystic ovarian disease) 10/22/2014  . GOITER, UNSPECIFIED 05/03/2007   Sheria Lang PT, DPT [phone]  04/08/2020, 12:36 PM  [facility] [address] [place], [place], [address] Phone: [phone]   Fax:  [phone]  Name: Shelby Aguilar MRN: [id number] Date of Birth: 04/12/1986

## 2020-04-10 ENCOUNTER — Other Ambulatory Visit: Payer: Self-pay

## 2020-04-10 ENCOUNTER — Inpatient Hospital Stay: Payer: Managed Care, Other (non HMO)

## 2020-04-10 DIAGNOSIS — E538 Deficiency of other specified B group vitamins: Secondary | ICD-10-CM

## 2020-04-10 MED ORDER — CYANOCOBALAMIN 1000 MCG/ML IJ SOLN
1000.0000 ug | Freq: Once | INTRAMUSCULAR | Status: AC
  Administered 2020-04-10: 1000 ug via INTRAMUSCULAR

## 2020-04-10 MED ORDER — CYANOCOBALAMIN 1000 MCG/ML IJ SOLN
INTRAMUSCULAR | Status: AC
  Filled 2020-04-10: qty 1

## 2020-04-14 ENCOUNTER — Ambulatory Visit: Payer: Managed Care, Other (non HMO) | Admitting: Physical Therapy

## 2020-04-15 ENCOUNTER — Other Ambulatory Visit: Payer: Self-pay

## 2020-04-15 ENCOUNTER — Ambulatory Visit (INDEPENDENT_AMBULATORY_CARE_PROVIDER_SITE_OTHER): Payer: Managed Care, Other (non HMO) | Admitting: Obstetrics and Gynecology

## 2020-04-15 ENCOUNTER — Encounter: Payer: Self-pay | Admitting: Obstetrics and Gynecology

## 2020-04-15 ENCOUNTER — Other Ambulatory Visit: Payer: Managed Care, Other (non HMO)

## 2020-04-15 VITALS — BP 118/70 | Ht 63.0 in | Wt 216.2 lb

## 2020-04-15 DIAGNOSIS — R102 Pelvic and perineal pain: Secondary | ICD-10-CM

## 2020-04-15 DIAGNOSIS — F53 Postpartum depression: Secondary | ICD-10-CM

## 2020-04-15 DIAGNOSIS — Z131 Encounter for screening for diabetes mellitus: Secondary | ICD-10-CM | POA: Diagnosis not present

## 2020-04-15 DIAGNOSIS — Z98891 History of uterine scar from previous surgery: Secondary | ICD-10-CM | POA: Diagnosis not present

## 2020-04-15 DIAGNOSIS — O99345 Other mental disorders complicating the puerperium: Secondary | ICD-10-CM

## 2020-04-15 DIAGNOSIS — G4730 Sleep apnea, unspecified: Secondary | ICD-10-CM

## 2020-04-15 NOTE — Progress Notes (Signed)
Pt is not doing her 2 hr GTT today but she is doing the GYN part of her visit. She also need you to sign a form for her work place.

## 2020-04-15 NOTE — Progress Notes (Signed)
Patient ID: Shelby Aguilar, female   DOB: 13-Jan-1986, 34 y.o.   MRN: [id number]  Reason for Consult: Gynecologic Exam   Referred by Etheleen Nicks, NP  Subjective:     HPI:  Shelby Aguilar is a 34 y.o. female. She is feeling well. Reports occasional sharp pain in breast with breastfeeding.    Past Medical History:  Diagnosis Date  . Anxiety   . Asthma   . B12 deficiency   . Depression   . Family history of breast cancer    1/21 cancer genetic tesitng letter sent  . GERD (gastroesophageal reflux disease)   . Gestational diabetes   . Iron deficiency anemia   . PCOS (polycystic ovarian syndrome)    Family History  Problem Relation Age of Onset  . Breast cancer Paternal Grandmother 38  . Diabetes Father   . Diabetes Sister    Past Surgical History:  Procedure Laterality Date  . CESAREAN SECTION  2013  . CESAREAN SECTION N/A 07/25/2018   Procedure: CESAREAN SECTION;  Surgeon: Natale Milch, MD;  Location: [facility] ORS;  Service: Obstetrics;  Laterality: N/A;  . CESAREAN SECTION WITH BILATERAL TUBAL LIGATION Bilateral 01/29/2020   Procedure: CESAREAN SECTION WITH BILATERAL TUBAL LIGATION;  Surgeon: Natale Milch, MD;  Location: [facility] ORS;  Service: Obstetrics;  Laterality: Bilateral;    Short Social History:  Social History   Tobacco Use  . Smoking status: Never Smoker  . Smokeless tobacco: Never Used  Substance Use Topics  . Alcohol use: Not Currently    No Known Allergies  Current Outpatient Medications  Medication Sig Dispense Refill  . budesonide (PULMICORT) 0.25 MG/2ML nebulizer solution Take 2 mLs (0.25 mg total) by nebulization 2 (two) times daily. (Patient not taking: Reported on 02/05/2020) 120 mL 2  . chlorhexidine (PERIDEX) 0.12 % solution SMARTSIG:By Mouth (Patient not taking: Reported on 02/05/2020)    . famotidine (PEPCID) 20 MG tablet TAKE 1 TABLET BY MOUTH EVERYDAY AT BEDTIME (Patient not taking: Reported on 03/11/2020) 30 tablet 0    . ferrous sulfate 325 (65 FE) MG tablet Take 325 mg by mouth 2 (two) times daily with a meal.     . Lancets (ONETOUCH ULTRASOFT) lancets Use as instructed (Patient not taking: Reported on 02/05/2020) 200 each 5  . montelukast (SINGULAIR) 10 MG tablet Take 1 tablet (10 mg total) by mouth daily. (Patient not taking: Reported on 03/11/2020) 30 tablet 2  . ONETOUCH VERIO test strip TEST BLOOD SUGAR 4 TIMES DAILY (Patient not taking: Reported on 02/05/2020) 100 strip 5  . Prenatal Vit-Fe Fumarate-FA (MULTIVITAMIN-PRENATAL) 27-0.8 MG TABS tablet Take 1 tablet by mouth daily.  (Patient not taking: Reported on 03/11/2020)    . sertraline (ZOLOFT) 50 MG tablet TAKE 1 TABLET BY MOUTH EVERY DAY 90 tablet 1   No current facility-administered medications for this visit.    Review of Systems  Constitutional: Negative for chills, fatigue, fever and unexpected weight change.  HENT: Negative for trouble swallowing.  Eyes: Negative for loss of vision.  Respiratory: Negative for cough, shortness of breath and wheezing.  Cardiovascular: Negative for chest pain, leg swelling, palpitations and syncope.  GI: Negative for abdominal pain, blood in stool, diarrhea, nausea and vomiting.  GU: Negative for difficulty urinating, dysuria, frequency and hematuria.  Musculoskeletal: Negative for back pain, leg pain and joint pain.  Skin: Negative for rash.  Neurological: Negative for dizziness, headaches, light-headedness, numbness and seizures.  Psychiatric: Negative for behavioral problem, confusion, depressed mood and  sleep disturbance.        Objective:  Objective   Vitals:   04/15/20 0924  BP: 118/70  Weight: 216 lb 3.2 oz (98.1 kg)  Height: 5\' 3"  (1.6 m)   Body mass index is 38.3 kg/m.  Physical Exam Vitals and nursing note reviewed.  Constitutional:      Appearance: She is well-developed.  HENT:     Head: Normocephalic and atraumatic.  Eyes:     Pupils: Pupils are equal, round, and reactive to light.   Cardiovascular:     Rate and Rhythm: Normal rate and regular rhythm.  Pulmonary:     Effort: Pulmonary effort is normal. No respiratory distress.  Skin:    General: Skin is warm and dry.  Neurological:     Mental Status: She is alert and oriented to person, place, and time.  Psychiatric:        Behavior: Behavior normal.        Thought Content: Thought content normal.        Judgment: Judgment normal.         Assessment/Plan:     34 yo [id number] 1. DM screening- patient will reschedule 2 hr GTT 2. Sleep apnea screening- pending, patient in process of scheduling 3. PPD- improving, patient not taking zoloft.  4. Pelvic pain- patient notes significant improvement with pelvic PT.  5. Patient okay to return to work without restriction in November.     December MD [facility], [facility] 04/15/2020 9:30 AM

## 2020-04-21 ENCOUNTER — Other Ambulatory Visit: Payer: Managed Care, Other (non HMO)

## 2020-04-21 ENCOUNTER — Ambulatory Visit: Payer: Managed Care, Other (non HMO) | Admitting: Physical Therapy

## 2020-04-24 ENCOUNTER — Encounter: Payer: Self-pay | Admitting: Physical Therapy

## 2020-04-24 ENCOUNTER — Ambulatory Visit: Payer: Managed Care, Other (non HMO) | Attending: Obstetrics and Gynecology | Admitting: Physical Therapy

## 2020-04-24 ENCOUNTER — Other Ambulatory Visit: Payer: Self-pay

## 2020-04-24 DIAGNOSIS — R102 Pelvic and perineal pain: Secondary | ICD-10-CM | POA: Insufficient documentation

## 2020-04-24 DIAGNOSIS — R278 Other lack of coordination: Secondary | ICD-10-CM | POA: Insufficient documentation

## 2020-04-24 DIAGNOSIS — M62838 Other muscle spasm: Secondary | ICD-10-CM | POA: Insufficient documentation

## 2020-04-24 NOTE — Therapy (Signed)
[place] [facility] [address] [place], [place], [address] Phone: [phone]   Fax:  [phone]  Physical Therapy Treatment  Patient Details  Name: Shelby Aguilar MRN: [id number] Date of Birth: [birth year] Referring Provider (PT): [place], [place]   Encounter Date: 04/24/2020   PT End of Session - 04/24/20 1414    Visit Number 5    Number of Visits 8    Date for PT Re-Evaluation 05/12/20    PT Start Time 1400    PT Stop Time 1445    PT Time Calculation (min) 45 min    Activity Tolerance Patient tolerated treatment well;Other (comment)   treatment limited 2/2 to school pick-up time conflict   Behavior During Therapy [facility] for tasks assessed/performed           Past Medical History:  Diagnosis Date  . Anxiety   . Asthma   . B12 deficiency   . Depression   . Family history of breast cancer    1/21 cancer genetic tesitng letter sent  . GERD (gastroesophageal reflux disease)   . Gestational diabetes   . Iron deficiency anemia   . PCOS (polycystic ovarian syndrome)     Past Surgical History:  Procedure Laterality Date  . CESAREAN SECTION  2013  . CESAREAN SECTION N/A 07/25/2018   Procedure: CESAREAN SECTION;  Surgeon: Natale Milch, MD;  Location: [facility] ORS;  Service: Obstetrics;  Laterality: N/A;  . CESAREAN SECTION WITH BILATERAL TUBAL LIGATION Bilateral 01/29/2020   Procedure: CESAREAN SECTION WITH BILATERAL TUBAL LIGATION;  Surgeon: Natale Milch, MD;  Location: [facility] ORS;  Service: Obstetrics;  Laterality: Bilateral;    There were no vitals filed for this visit.   Subjective Assessment - 04/24/20 1403    Subjective Patient notes that she has had sex twice, both times painful (6/10). Patient notes she has been able to control UI. She was out with her friends and "laughing uncontrollably" and did not have any urinary leakage.    Currently in Pain? No/denies           TREATMENT Manual  Therapy: Patient educated on the purpose of the pelvic exam and articulated understanding; patient consented to the exam verbally. MFR/TPR of Alcock's canal, B, for decreased pain and neural tension  MFR of perineum for decreased tension and improved tissue extensibility at the introitus/posterior fourchette  Neuromuscular Re-education: Supine hooklying diaphragmatic breathing with VCs and TCs for downregulation of the nervous system and improved management of IAP Patient education on self-massage of perineum for pain modulation and control.  Patient educated throughout session on appropriate technique and form using multi-modal cueing, HEP, and activity modification. Patient articulated understanding and returned demonstration.  Patient Response to interventions: Patient comfortable with HEP addition of perineum massage.  ASSESSMENT Patient presents to clinic with excellent motivation to participate in therapy. Patient demonstrates deficits in PFM function, PFM coordination, IAP management, posture, pain. Patient had good release of tension in tissues surrounding Alcock's canal and at the perineum with manual during today's session, but responded positively to manual interventions. Patient will benefit from continued skilled therapeutic intervention to address remaining deficits in  PFM function, PFM coordination, IAP management, posture, pain in order to increase function and improve overall QOL.     PT Long Term Goals - 03/17/20 1534      PT LONG TERM GOAL #1   Title Patient will demonstrate independence with HEP in order to maximize therapeutic gains and improve carryover from physical  therapy sessions to ADLs in the home and community.    Baseline IE: not initiated    Time 8    Period Weeks    Status New    Target Date 05/12/20      PT LONG TERM GOAL #2   Title Patient will decrease worst pain as reported on NPRS by at least 2 points to demonstrate clinically significant reduction  in pain in order to restore/improve function and overall QOL.    Baseline IE: 10/10 pain with penetration    Time 8    Period Weeks    Status New    Target Date 05/12/20      PT LONG TERM GOAL #3   Title Patient will demonstrate improved function as evidenced by a score of 66 on FOTO measure for full participation in activities at home and in the community.    Baseline IE: 51    Time 8    Period Weeks    Status New    Target Date 05/12/20      PT LONG TERM GOAL #4   Title Patient will report use of pantyliners/no protective undergarment and <3 incidents of stress urinary incontinence over the course of 1 week while coughing/sneezing/laughing/prolonged activity in order to demonstrate improved PFM coordination, strength, and function for improved overall QOL.    Baseline IE: poise pad/day    Time 8    Period Weeks    Status New    Target Date 05/12/20      PT LONG TERM GOAL #5   Title Patient will report being able to return to activities including, but not limited to: menstrual cup and intercourse without pain or limitation to indicate complete resolution of the chief complaint and return to prior level of participation at home and in the community.    Baseline IE: 10/10 pain with penetration    Time 8    Period Weeks    Status New    Target Date 05/12/20                 Plan - 04/24/20 1414    Clinical Impression Statement Patient presents to clinic with excellent motivation to participate in therapy. Patient demonstrates deficits in PFM function, PFM coordination, IAP management, posture, pain. Patient had good release of tension in tissues surrounding Alcock's canal and at the perineum with manual during today's session, but responded positively to manual interventions. Patient will benefit from continued skilled therapeutic intervention to address remaining deficits in  PFM function, PFM coordination, IAP management, posture, pain in order to increase function and improve  overall QOL.    Personal Factors and Comorbidities Behavior Pattern;Comorbidity 3+;Past/Current Experience;Time since onset of injury/illness/exacerbation;Sex    Comorbidities asthma, depression, GERD, PCOS, hx of cesarean section x2, anemia, anxiety    Examination-Activity Limitations Caring for Others;Other;Sleep;Continence    Examination-Participation Restrictions Interpersonal Relationship;Shop;Laundry;Cleaning;Community Activity    Stability/Clinical Decision Making Evolving/Moderate complexity    Rehab Potential Good    PT Frequency 1x / week    PT Duration 8 weeks    PT Treatment/Interventions ADLs/Self Care Home Management;Aquatic Therapy;Moist Heat;Cryotherapy;Electrical Stimulation;Neuromuscular re-education;Therapeutic exercise;Therapeutic activities;Manual techniques;Scar mobilization;Taping;Dry needling;Spinal Manipulations;Joint Manipulations;Passive range of motion;Patient/family education;Orthotic Fit/Training    PT Next Visit Plan posterior fourchette stretch    PT Home Exercise Plan diaphragmatic breathing    Consulted and Agree with Plan of Care Patient           Patient will benefit from skilled therapeutic intervention in order to improve the  following deficits and impairments:  Abnormal gait, Decreased endurance, Improper body mechanics, Postural dysfunction, Pain, Impaired flexibility, Increased fascial restricitons, Decreased strength, Decreased coordination, Decreased range of motion, Decreased scar mobility  Visit Diagnosis: Pelvic pain  Other lack of coordination  Other muscle spasm     Problem List Patient Active Problem List   Diagnosis Date Noted  . Iron deficiency anemia 02/12/2020  . Single liveborn infant, delivered by cesarean   . Encounter for care or examination of lactating mother   . Postpartum care following cesarean delivery   . Supervision of high risk pregnancy in third trimester 01/29/2020  . Insulin controlled gestational diabetes  mellitus (GDM) in third trimester   . GERD (gastroesophageal reflux disease) 01/22/2020  . Labor and delivery, indication for care 01/07/2020  . [redacted] weeks gestation of pregnancy   . Thyromegaly 12/24/2019  . B12 deficiency 12/19/2019  . Normocytic anemia 12/17/2019  . Severe persistent asthma without complication 11/14/2019  . Amenorrhea 10/16/2019  . Pregestational diabetes mellitus, modified White class B 08/02/2019  . BMI 39.0-39.9,adult 07/19/2019  . History of 2 cesarean sections 07/10/2019  . Obesity affecting pregnancy, antepartum 11/19/2017  . Supervision of high risk pregnancy, antepartum 11/19/2017  . PCOD (polycystic ovarian disease) 10/22/2014  . GOITER, UNSPECIFIED 05/03/2007   Sheria Lang PT, DPT [phone]  04/24/2020, 2:58 PM  [place] [facility] [address] [place], [place], [address] Phone: [phone]   Fax:  [phone]  Name: Shelby Aguilar MRN: [id number] Date of Birth: [birth year]

## 2020-04-28 ENCOUNTER — Ambulatory Visit: Payer: Managed Care, Other (non HMO) | Admitting: Physical Therapy

## 2020-04-29 ENCOUNTER — Encounter: Payer: Self-pay | Admitting: Physical Therapy

## 2020-04-29 ENCOUNTER — Other Ambulatory Visit: Payer: Self-pay

## 2020-04-29 ENCOUNTER — Ambulatory Visit: Payer: Managed Care, Other (non HMO) | Admitting: Physical Therapy

## 2020-04-29 DIAGNOSIS — M62838 Other muscle spasm: Secondary | ICD-10-CM

## 2020-04-29 DIAGNOSIS — R102 Pelvic and perineal pain: Secondary | ICD-10-CM

## 2020-04-29 DIAGNOSIS — R278 Other lack of coordination: Secondary | ICD-10-CM

## 2020-04-29 NOTE — Therapy (Signed)
[facility] [address] [place], [place], [address] Phone: [phone]   Fax:  [phone]  Physical Therapy Treatment  Patient Details  Name: Shelby Aguilar MRN: [id number] Date of Birth: 10/08/1985 Referring Provider (PT): Adelene Idler   Encounter Date: 04/29/2020   PT End of Session - 04/29/20 0910    Visit Number 6    Number of Visits 8    Date for PT Re-Evaluation 05/12/20    PT Start Time 0900    PT Stop Time 0955    PT Time Calculation (min) 55 min    Activity Tolerance Patient tolerated treatment well    Behavior During Therapy [facility] for tasks assessed/performed           Past Medical History:  Diagnosis Date  . Anxiety   . Asthma   . B12 deficiency   . Depression   . Family history of breast cancer    1/21 cancer genetic tesitng letter sent  . GERD (gastroesophageal reflux disease)   . Gestational diabetes   . Iron deficiency anemia   . PCOS (polycystic ovarian syndrome)     Past Surgical History:  Procedure Laterality Date  . CESAREAN SECTION  2013  . CESAREAN SECTION N/A 07/25/2018   Procedure: CESAREAN SECTION;  Surgeon: Natale Milch, MD;  Location: [facility] ORS;  Service: Obstetrics;  Laterality: N/A;  . CESAREAN SECTION WITH BILATERAL TUBAL LIGATION Bilateral 01/29/2020   Procedure: CESAREAN SECTION WITH BILATERAL TUBAL LIGATION;  Surgeon: Natale Milch, MD;  Location: [facility] ORS;  Service: Obstetrics;  Laterality: Bilateral;    There were no vitals filed for this visit.   Subjective Assessment - 04/29/20 0903    Subjective Patient notes no significant changes since last session. She did have a busy weekend with events so she did not have much time to do her exercises.    Currently in Pain? No/denies           TREATMENT Manual Therapy: Patient educated on the purpose of the pelvic exam and articulated understanding; patient consented to the exam verbally. MFR/TPR of  Alcock's canal, B, for decreased pain and neural tension  MFR of perineum for decreased tension and improved tissue extensibility at the introitus/posterior fourchette  Neuromuscular Re-education: Supine hooklying diaphragmatic breathing with VCs and TCs for downregulation of the nervous system and improved management of IAP  Patient educated throughout session on appropriate technique and form using multi-modal cueing, HEP, and activity modification. Patient articulated understanding and returned demonstration.  Patient Response to interventions: Patient notes 6/10 tenderness at L Alcock's canal resolved from massage  ASSESSMENT Patient presents to clinic with excellent motivation to participate in therapy. Patient demonstrates deficits in PFM function, PFM coordination, IAP management, posture, pain. Patient continues to have reduction of tension in tissues surrounding Alcock's canal and at the perineum with manual during today's session and responded positively to manual interventions. Patient will benefit from continued skilled therapeutic intervention to address remaining deficits in  PFM function, PFM coordination, IAP management, posture, pain in order to increase function and improve overall QOL.     PT Long Term Goals - 03/17/20 1534      PT LONG TERM GOAL #1   Title Patient will demonstrate independence with HEP in order to maximize therapeutic gains and improve carryover from physical therapy sessions to ADLs in the home and community.    Baseline IE: not initiated    Time 8    Period Weeks  Status New    Target Date 05/12/20      PT LONG TERM GOAL #2   Title Patient will decrease worst pain as reported on NPRS by at least 2 points to demonstrate clinically significant reduction in pain in order to restore/improve function and overall QOL.    Baseline IE: 10/10 pain with penetration    Time 8    Period Weeks    Status New    Target Date 05/12/20      PT LONG TERM  GOAL #3   Title Patient will demonstrate improved function as evidenced by a score of 66 on FOTO measure for full participation in activities at home and in the community.    Baseline IE: 51    Time 8    Period Weeks    Status New    Target Date 05/12/20      PT LONG TERM GOAL #4   Title Patient will report use of pantyliners/no protective undergarment and <3 incidents of stress urinary incontinence over the course of 1 week while coughing/sneezing/laughing/prolonged activity in order to demonstrate improved PFM coordination, strength, and function for improved overall QOL.    Baseline IE: poise pad/day    Time 8    Period Weeks    Status New    Target Date 05/12/20      PT LONG TERM GOAL #5   Title Patient will report being able to return to activities including, but not limited to: menstrual cup and intercourse without pain or limitation to indicate complete resolution of the chief complaint and return to prior level of participation at home and in the community.    Baseline IE: 10/10 pain with penetration    Time 8    Period Weeks    Status New    Target Date 05/12/20                 Plan - 04/29/20 1224    Clinical Impression Statement Patient presents to clinic with excellent motivation to participate in therapy. Patient demonstrates deficits in PFM function, PFM coordination, IAP management, posture, pain. Patient continues to have reduction of tension in tissues surrounding Alcock's canal and at the perineum with manual during today's session and responded positively to manual interventions. Patient will benefit from continued skilled therapeutic intervention to address remaining deficits in  PFM function, PFM coordination, IAP management, posture, pain in order to increase function and improve overall QOL.    Personal Factors and Comorbidities Behavior Pattern;Comorbidity 3+;Past/Current Experience;Time since onset of injury/illness/exacerbation;Sex    Comorbidities  asthma, depression, GERD, PCOS, hx of cesarean section x2, anemia, anxiety    Examination-Activity Limitations Caring for Others;Other;Sleep;Continence    Examination-Participation Restrictions Interpersonal Relationship;Shop;Laundry;Cleaning;Community Activity    Stability/Clinical Decision Making Evolving/Moderate complexity    Rehab Potential Good    PT Frequency 1x / week    PT Duration 8 weeks    PT Treatment/Interventions ADLs/Self Care Home Management;Aquatic Therapy;Moist Heat;Cryotherapy;Electrical Stimulation;Neuromuscular re-education;Therapeutic exercise;Therapeutic activities;Manual techniques;Scar mobilization;Taping;Dry needling;Spinal Manipulations;Joint Manipulations;Passive range of motion;Patient/family education;Orthotic Fit/Training    PT Next Visit Plan posterior fourchette stretch    PT Home Exercise Plan diaphragmatic breathing    Consulted and Agree with Plan of Care Patient           Patient will benefit from skilled therapeutic intervention in order to improve the following deficits and impairments:  Abnormal gait, Decreased endurance, Improper body mechanics, Postural dysfunction, Pain, Impaired flexibility, Increased fascial restricitons, Decreased strength, Decreased coordination, Decreased range of motion,  Decreased scar mobility  Visit Diagnosis: Pelvic pain  Other lack of coordination  Other muscle spasm     Problem List Patient Active Problem List   Diagnosis Date Noted  . Iron deficiency anemia 02/12/2020  . Single liveborn infant, delivered by cesarean   . Encounter for care or examination of lactating mother   . Postpartum care following cesarean delivery   . Supervision of high risk pregnancy in third trimester 01/29/2020  . Insulin controlled gestational diabetes mellitus (GDM) in third trimester   . GERD (gastroesophageal reflux disease) 01/22/2020  . Labor and delivery, indication for care 01/07/2020  . [redacted] weeks gestation of pregnancy    . Thyromegaly 12/24/2019  . B12 deficiency 12/19/2019  . Normocytic anemia 12/17/2019  . Severe persistent asthma without complication 11/14/2019  . Amenorrhea 10/16/2019  . Pregestational diabetes mellitus, modified White class B 08/02/2019  . BMI 39.0-39.9,adult 07/19/2019  . History of 2 cesarean sections 07/10/2019  . Obesity affecting pregnancy, antepartum 11/19/2017  . Supervision of high risk pregnancy, antepartum 11/19/2017  . PCOD (polycystic ovarian disease) 10/22/2014  . GOITER, UNSPECIFIED 05/03/2007    Sheria Lang PT, DPT [id number]  04/29/2020, 12:24 PM  [place] [facility] [address] [place], [place], [address] Phone: [phone]   Fax:  [phone]  Name: Shelby Aguilar MRN: [id number] Date of Birth: [birth year]

## 2020-05-05 ENCOUNTER — Encounter: Payer: Managed Care, Other (non HMO) | Admitting: Physical Therapy

## 2020-05-06 ENCOUNTER — Inpatient Hospital Stay: Payer: Managed Care, Other (non HMO) | Attending: Hematology and Oncology

## 2020-05-12 ENCOUNTER — Encounter: Payer: Managed Care, Other (non HMO) | Admitting: Physical Therapy

## 2020-05-19 ENCOUNTER — Ambulatory Visit: Payer: Managed Care, Other (non HMO) | Admitting: Physical Therapy

## 2020-05-26 ENCOUNTER — Ambulatory Visit: Payer: Managed Care, Other (non HMO) | Admitting: Physical Therapy

## 2020-05-29 ENCOUNTER — Encounter: Payer: Self-pay | Admitting: Physical Therapy

## 2020-05-29 ENCOUNTER — Ambulatory Visit: Payer: Managed Care, Other (non HMO) | Attending: Cardiology | Admitting: Physical Therapy

## 2020-05-29 ENCOUNTER — Other Ambulatory Visit: Payer: Self-pay

## 2020-05-29 DIAGNOSIS — M62838 Other muscle spasm: Secondary | ICD-10-CM | POA: Insufficient documentation

## 2020-05-29 DIAGNOSIS — R102 Pelvic and perineal pain: Secondary | ICD-10-CM | POA: Insufficient documentation

## 2020-05-29 DIAGNOSIS — R278 Other lack of coordination: Secondary | ICD-10-CM | POA: Diagnosis present

## 2020-05-29 NOTE — Therapy (Signed)
[facility] [address] [place], [place], [address] Phone: [phone]   Fax:  [phone]  Physical Therapy Treatment/Discharge  Patient Details  Name: Shelby Aguilar MRN: [id number] Date of Birth: [birth year] Referring Provider (PT): Adrian Prows   Encounter Date: 05/29/2020   PT End of Session - 05/29/20 1703    Visit Number 7    Number of Visits 8    Date for PT Re-Evaluation 05/29/20    PT Start Time 1700    PT Stop Time 1755    PT Time Calculation (min) 55 min    Activity Tolerance Patient tolerated treatment well    Behavior During Therapy [facility] for tasks assessed/performed           Past Medical History:  Diagnosis Date  . Anxiety   . Asthma   . B12 deficiency   . Depression   . Family history of breast cancer    1/21 cancer genetic tesitng letter sent  . GERD (gastroesophageal reflux disease)   . Gestational diabetes   . Iron deficiency anemia   . PCOS (polycystic ovarian syndrome)     Past Surgical History:  Procedure Laterality Date  . CESAREAN SECTION  2013  . CESAREAN SECTION N/A 07/25/2018   Procedure: CESAREAN SECTION;  Surgeon: Homero Fellers, MD;  Location: [facility] ORS;  Service: Obstetrics;  Laterality: N/A;  . CESAREAN SECTION WITH BILATERAL TUBAL LIGATION Bilateral 01/29/2020   Procedure: CESAREAN SECTION WITH BILATERAL TUBAL LIGATION;  Surgeon: Homero Fellers, MD;  Location: [facility] ORS;  Service: Obstetrics;  Laterality: Bilateral;    There were no vitals filed for this visit.   Subjective Assessment - 05/29/20 1700    Subjective Patient notes that she has returned to work and is preparing to start a job. Patient notes she feels confident to discharge. She has returned to interocurse pain free and has ceased wearing poise pads.    Currently in Pain? No/denies           TREATMENT Neuromuscular Re-education: Reassessed goals; see below.  Patient education on gradual  reintroduction to menstrual cup use for decrease risk of pain relapse.  Patient educated throughout session on appropriate technique and form using multi-modal cueing, HEP, and activity modification. Patient articulated understanding and returned demonstration.  Patient Response to interventions: Patient confident to self-manage.  ASSESSMENT Patient presents to clinic with excellent motivation to participate in therapy. Patient demonstrates no remaining deficits in PFM function, PFM coordination, IAP management, posture, pain. Patient has met or surpassed all goals and indicates complete confidence in self-management. Patient is appropriate to discharge to self-management in order to maintain PFM function, PFM coordination, IAP management, posture, pain.       PT Long Term Goals - 05/29/20 1703      PT LONG TERM GOAL #1   Title Patient will demonstrate independence with HEP in order to maximize therapeutic gains and improve carryover from physical therapy sessions to ADLs in the home and community.    Baseline IE: not initiated; 12/9: IND    Time 8    Period Weeks    Status Achieved      PT LONG TERM GOAL #2   Title Patient will decrease worst pain as reported on NPRS by at least 2 points to demonstrate clinically significant reduction in pain in order to restore/improve function and overall QOL.    Baseline IE: 10/10 pain with penetration; 12/9: 0/10 pain with penetration    Time 8  Period Weeks    Status Achieved      PT LONG TERM GOAL #3   Title Patient will demonstrate improved function as evidenced by a score of 66 on FOTO measure for full participation in activities at home and in the community.    Baseline IE: 51; 12/9: 79    Time 8    Period Weeks    Status Achieved      PT LONG TERM GOAL #4   Title Patient will report use of pantyliners/no protective undergarment and <3 incidents of stress urinary incontinence over the course of 1 week while  coughing/sneezing/laughing/prolonged activity in order to demonstrate improved PFM coordination, strength, and function for improved overall QOL.    Baseline IE: poise pad/day; 12/9: no poise pad, no leakage    Time 8    Period Weeks    Status Achieved      PT LONG TERM GOAL #5   Title Patient will report being able to return to activities including, but not limited to: menstrual cup and intercourse without pain or limitation to indicate complete resolution of the chief complaint and return to prior level of participation at home and in the community.    Baseline IE: 10/10 pain with penetration; 12/9: 0/10 pain with penetration, no need for menstrual cup as mestruation has not resumed    Time 8    Period Weeks    Status Achieved                 Plan - 05/29/20 1703    Clinical Impression Statement Patient presents to clinic with excellent motivation to participate in therapy. Patient demonstrates no remaining deficits in PFM function, PFM coordination, IAP management, posture, pain. Patient has met or surpassed all goals and indicates complete confidence in self-management. Patient is appropriate to discharge to self-management in order to maintain PFM function, PFM coordination, IAP management, posture, pain.    Personal Factors and Comorbidities Behavior Pattern;Comorbidity 3+;Past/Current Experience;Time since onset of injury/illness/exacerbation;Sex    Comorbidities asthma, depression, GERD, PCOS, hx of cesarean section x2, anemia, anxiety    Examination-Activity Limitations Caring for Others;Other;Sleep;Continence    Examination-Participation Restrictions Interpersonal Relationship;Shop;Laundry;Cleaning;Community Activity    Stability/Clinical Decision Making Evolving/Moderate complexity    Rehab Potential Good    PT Frequency 1x / week    PT Duration 8 weeks    PT Treatment/Interventions ADLs/Self Care Home Management;Aquatic Therapy;Moist Heat;Cryotherapy;Electrical  Stimulation;Neuromuscular re-education;Therapeutic exercise;Therapeutic activities;Manual techniques;Scar mobilization;Taping;Dry needling;Spinal Manipulations;Joint Manipulations;Passive range of motion;Patient/family education;Orthotic Fit/Training    PT Next Visit Plan --    PT Home Exercise Plan --    Consulted and Agree with Plan of Care Patient           Patient will benefit from skilled therapeutic intervention in order to improve the following deficits and impairments:  Abnormal gait,Decreased endurance,Improper body mechanics,Postural dysfunction,Pain,Impaired flexibility,Increased fascial restricitons,Decreased strength,Decreased coordination,Decreased range of motion,Decreased scar mobility  Visit Diagnosis: Pelvic pain  Other lack of coordination  Other muscle spasm     Problem List Patient Active Problem List   Diagnosis Date Noted  . Iron deficiency anemia 02/12/2020  . Single liveborn infant, delivered by cesarean   . Encounter for care or examination of lactating mother   . Postpartum care following cesarean delivery   . Supervision of high risk pregnancy in third trimester 01/29/2020  . Insulin controlled gestational diabetes mellitus (GDM) in third trimester   . GERD (gastroesophageal reflux disease) 01/22/2020  . Labor and delivery, indication for care 01/07/2020  .  [redacted] weeks gestation of pregnancy   . Thyromegaly 12/24/2019  . B12 deficiency 12/19/2019  . Normocytic anemia 12/17/2019  . Severe persistent asthma without complication 06/99/9672  . Amenorrhea 10/16/2019  . Pregestational diabetes mellitus, modified White class B 08/02/2019  . BMI 39.0-39.9,adult 07/19/2019  . History of 2 cesarean sections 07/10/2019  . Obesity affecting pregnancy, antepartum 11/19/2017  . Supervision of high risk pregnancy, antepartum 11/19/2017  . PCOD (polycystic ovarian disease) 10/22/2014  . GOITER, UNSPECIFIED 05/03/2007   Myles Gip PT, DPT [phone]  05/29/2020,  5:37 PM  [facility] [address] [place], [place], [address] Phone: [phone]   Fax:  [phone]  Name: Shelby Aguilar MRN: [id number] Date of Birth: 1985-06-29

## 2020-06-03 ENCOUNTER — Inpatient Hospital Stay: Payer: Managed Care, Other (non HMO) | Attending: Hematology and Oncology

## 2020-07-01 ENCOUNTER — Inpatient Hospital Stay: Payer: Managed Care, Other (non HMO) | Attending: Hematology and Oncology

## 2022-02-10 ENCOUNTER — Encounter: Payer: Self-pay | Admitting: Hematology and Oncology

## 2022-02-11 NOTE — Progress Notes (Unsigned)
Sleep Medicine   Office Visit  Patient Name: Shelby Aguilar DOB: 03/16/1963 MRN [id number]    Chief Complaint: ***  Brief History:  Shelby presents for initial sleep consult with a *** history of ***. Sleep quality is ***. This is noted *** nights. The patient's bed partner reports  *** at night. The patient relates the following symptoms: *** are also present. The patient goes to sleep at *** and wakes up at ***.   Sleep quality is *** when outside home environment.  Patient has noted *** of her legs at night.  The patient  relates *** behavior during the night.  The patient *** a history of psychiatric problems. The Epworth Sleepiness Score is *** out of 24 .  The patient relates  Cardiovascular risk factors include: *** The patient reports ***    ROS  General: (-) fever, (-) chills, (-) night sweat Nose and Sinuses: (-) nasal stuffiness or itchiness, (-) postnasal drip, (-) nosebleeds, (-) sinus trouble. Mouth and Throat: (-) sore throat, (-) hoarseness. Neck: (-) swollen glands, (-) enlarged thyroid, (-) neck pain. Respiratory: *** cough, *** shortness of breath, *** wheezing. Neurologic: *** numbness, *** tingling. Psychiatric: *** anxiety, *** depression Sleep behavior: ***sleep paralysis ***hypnogogic hallucinations ***dream enactment      ***vivid dreams ***cataplexy ***night terrors ***sleep walking   Current Medication: No outpatient encounter medications on file as of 08/12/2022.   No facility-administered encounter medications on file as of 08/12/2022.    Surgical History: *** The histories are not reviewed yet. Please review them in the "History" navigator section and refresh this SmartLink.  Medical History: No past medical history on file.  Family History: Non contributory to the present illness  Social History: Social History   Socioeconomic History   Marital status: Not on file    Spouse name: Not on file   Number of children: Not on file   Years of  education: Not on file   Highest education level: Not on file  Occupational History   Not on file  Tobacco Use   Smoking status: Not on file   Smokeless tobacco: Not on file  Substance and Sexual Activity   Alcohol use: Not on file   Drug use: Not on file   Sexual activity: Not on file  Other Topics Concern   Not on file  Social History Narrative   Not on file   Social Determinants of Health   Financial Resource Strain: Not on file  Food Insecurity: Not on file  Transportation Needs: Not on file  Physical Activity: Not on file  Stress: Not on file  Social Connections: Not on file  Intimate Partner Violence: Not on file    Vital Signs: There were no vitals taken for this visit. There is no height or weight on file to calculate BMI.   Examination: General Appearance: The patient is well-developed, well-nourished, and in no distress. Neck Circumference: *** Skin: Gross inspection of skin unremarkable. Head: normocephalic, no gross deformities. Eyes: no gross deformities noted. ENT: ears appear grossly normal Neurologic: Alert and oriented. No involuntary movements.    STOP BANG RISK ASSESSMENT S (snore) Have you been told that you snore?     YES/N   T (tired) Are you often tired, fatigued, or sleepy during the day?   YES/NO  O (obstruction) Do you stop breathing, choke, or gasp during sleep? YES/NO   P (pressure) Do you have or are you being treated for high blood pressure? YES/NO   B (  BMI) Is your body index greater than 35 kg/m? YES/NO   A (age) Are you 50 years old or older? YES/NO   N (neck) Do you have a neck circumference greater than 16 inches?   YES/NO   G (gender) Are you a female? YES/NO   TOTAL STOP/BANG "YES" ANSWERS                                                                A STOP-Bang score of 2 or less is considered low risk, and a score of 5 or more is high risk for having either moderate or severe OSA. For people who score 3 or 4,  doctors may need to perform further assessment to determine how likely they are to have OSA.         EPWORTH SLEEPINESS SCALE:  Scale:  (0)= no chance of dozing; (1)= slight chance of dozing; (2)= moderate chance of dozing; (3)= high chance of dozing  Chance  Situtation    Sitting and reading: ***    Watching TV: ***    Sitting Inactive in public: ***    As a passenger in car: ***      Lying down to rest: ***    Sitting and talking: ***    Sitting quielty after lunch: ***    In a car, stopped in traffic: ***   TOTAL SCORE:   *** out of 24    SLEEP STUDIES:  ***   LABS: No results found for this or any previous visit (from the past 2160 hour(s)).  Radiology: Patient was never admitted.  No results found.  No results found.    Assessment and Plan: There are no problems to display for this patient.    PLAN OSA:   Patient evaluation suggests high risk of sleep disordered breathing due to *** Patient has comorbid cardiovascular risk factors including: *** which could be exacerbated by pathologic sleep-disordered breathing.  Suggest: *** to assess/treat the patient's sleep disordered breathing. The patient was also counselled on *** to optimize sleep health.  PLAN hypersomnia:  Patient evaluation suggests significant daytime hypersomnia.  The Epworth Sleepiness Score is elevated at *** out of 24. Patient *** drowsy driving. The patient *** MVA due to sleepiness.  The patient *** restless leg symptoms which exacerbate *** for *** nights per week. The patient *** periodic limb movements which exacerbate ***  for *** nights per week. Suggest: ***  Also suggest ***  PLAN insomnia:  Patient evaluation suggests *** insomnia. This is a chronic disorder. This has been a concern for *** and causes impaired daytime functioning. The patient exhibits comorbid ***  The history *** suggest the insomnia predates the use of hypnotic medications. The symptoms *** with the  discontinuation of these medications. There is no obvious medical, psychiatric or pharmacologic abuse issues ot account for the insomnia.  Treatment recommendations include: *** The patient should maintain a sleep log and calculate total sleep time for 1-2 weeks. Set bed and wake times for achieve 85% sleep efficiency for one week. Once this is achieved  time in bed can be gradually increased. A pharmacologic treatment approach would include a trial of *** for the next ***  months. During this time the patient is to maintain a sleep   diary to track progress.    ***  General Counseling: I have discussed the findings of the evaluation and examination with Shelby.  I have also discussed any further diagnostic evaluation thatmay be needed or ordered today. Shelby verbalizes understanding of the findings of todays visit. We also reviewed her medications today and discussed drug interactions and side effects including but not limited excessive drowsiness and altered mental states. We also discussed that there is always a risk not just to her but also people around her. she has been encouraged to call the office with any questions or concerns that should arise related to todays visit.  No orders of the defined types were placed in this encounter.       I have personally obtained a history, evaluated the patient, evaluated pertinent data, formulated the assessment and plan and placed orders.    Makayela Secrest A Rogerio Boutelle, MD FCCP Diplomate ABMS Pulmonary and Critical Care Medicine Sleep medicine  

## 2022-02-15 ENCOUNTER — Ambulatory Visit: Payer: BC Managed Care – PPO | Admitting: Internal Medicine

## 2022-04-25 NOTE — Progress Notes (Signed)
[facility] [place], [place] [address]  Pulmonary Sleep Medicine   Office Visit Note  Patient Name: Shelby Aguilar DOB: 05-01-1986 MRN [id number]    Chief Complaint: Obstructive Sleep Apnea visit  Brief History:  Naasia is seen today for an initial sleep evaluation since set up 12/12/21 on CPAP@9cmh20 . The patient has a 4 month history of sleep apnea. Prior to using CPAP patient reports   snoring, observed apnea, night sweats, daytime sleepiness.            Patient is  not using PAP nightly.  The patient feels like she's suffocating after sleeping with PAP.  The patient reports no complaints otherwise from PAP use. Reported sleepiness is  improved and the Epworth Sleepiness Score is 7 out of 24. The patient rarely take naps. The patient complains of the following: none  The compliance download shows 17%  compliance with an average use time of 5  hours. The AHI is 0.6  The patient does not complain of limb movements disrupting sleep.  ROS  General: (-) fever, (-) chills, (-) night sweat Nose and Sinuses: (-) nasal stuffiness or itchiness, (-) postnasal drip, (-) nosebleeds, (-) sinus trouble. Mouth and Throat: (-) sore throat, (-) hoarseness. Neck: (-) swollen glands, (-) enlarged thyroid, (-) neck pain. Respiratory: - cough, - shortness of breath, - wheezing. Neurologic: - numbness, - tingling. Psychiatric: - anxiety, - depression   Current Medication: Outpatient Encounter Medications as of 04/26/2022  Medication Sig   QVAR REDIHALER 40 MCG/ACT inhaler Inhale into the lungs.   budesonide (PULMICORT) 0.25 MG/2ML nebulizer solution Take 2 mLs (0.25 mg total) by nebulization 2 (two) times daily. (Patient not taking: Reported on 02/05/2020)   chlorhexidine (PERIDEX) 0.12 % solution SMARTSIG:By Mouth (Patient not taking: Reported on 02/05/2020)   escitalopram (LEXAPRO) 10 MG tablet Take by mouth.   famotidine (PEPCID) 20 MG tablet TAKE 1 TABLET BY MOUTH  EVERYDAY AT BEDTIME (Patient not taking: Reported on 03/11/2020)   ferrous sulfate 325 (65 FE) MG tablet Take 325 mg by mouth 2 (two) times daily with a meal.    Lancets (ONETOUCH ULTRASOFT) lancets Use as instructed (Patient not taking: Reported on 02/05/2020)   montelukast (SINGULAIR) 10 MG tablet Take 1 tablet (10 mg total) by mouth daily. (Patient not taking: Reported on 03/11/2020)   ONETOUCH VERIO test strip TEST BLOOD SUGAR 4 TIMES DAILY (Patient not taking: Reported on 02/05/2020)   Prenatal Vit-Fe Fumarate-FA (MULTIVITAMIN-PRENATAL) 27-0.8 MG TABS tablet Take 1 tablet by mouth daily.  (Patient not taking: Reported on 03/11/2020)   sertraline (ZOLOFT) 50 MG tablet TAKE 1 TABLET BY MOUTH EVERY DAY   No facility-administered encounter medications on file as of 04/26/2022.    Surgical History: Past Surgical History:  Procedure Laterality Date   CESAREAN SECTION  2013   CESAREAN SECTION N/A 07/25/2018   Procedure: CESAREAN SECTION;  Surgeon: Homero Fellers, MD;  Location: [facility] ORS;  Service: Obstetrics;  Laterality: N/A;   CESAREAN SECTION WITH BILATERAL TUBAL LIGATION Bilateral 01/29/2020   Procedure: CESAREAN SECTION WITH BILATERAL TUBAL LIGATION;  Surgeon: Homero Fellers, MD;  Location: [facility] ORS;  Service: Obstetrics;  Laterality: Bilateral;    Medical History: Past Medical History:  Diagnosis Date   Anxiety    Asthma    B12 deficiency    Depression    Family history of breast cancer    1/21 cancer genetic tesitng letter sent   GERD (gastroesophageal reflux disease)    Gestational  diabetes    Iron deficiency anemia    PCOS (polycystic ovarian syndrome)     Family History: Non contributory to the present illness  Social History: Social History   Socioeconomic History   Marital status: Married    Spouse name: Not on file   Number of children: Not on file   Years of education: Not on file   Highest education level: Not on file  Occupational History   Not on  file  Tobacco Use   Smoking status: Never   Smokeless tobacco: Never  Vaping Use   Vaping Use: Never used  Substance and Sexual Activity   Alcohol use: Not Currently   Drug use: No   Sexual activity: Yes    Partners: Male    Birth control/protection: Surgical    Comment: BTL  Other Topics Concern   Not on file  Social History Narrative   Not on file   Social Determinants of Health   Financial Resource Strain: Not on file  Food Insecurity: Not on file  Transportation Needs: Not on file  Physical Activity: Insufficiently Active (06/17/2017)   Exercise Vital Sign    Days of Exercise per Week: 3 days    Minutes of Exercise per Session: 30 min  Stress: Stress Concern Present (06/17/2017)   [place]    Feeling of Stress : Rather much  Social Connections: Socially Integrated (06/17/2017)   Social Connection and Isolation Panel [NHANES]    Frequency of Communication with Friends and Family: More than three times a week    Frequency of Social Gatherings with Friends and Family: Never    Attends Religious Services: More than 4 times per year    Active Member of [employer] or Organizations: Yes    Attends Archivist Meetings: More than 4 times per year    Marital Status: Married  Human resources officer Violence: Not At Risk (06/17/2017)   Humiliation, Afraid, Rape, and Kick questionnaire    Fear of Current or Ex-Partner: No    Emotionally Abused: No    Physically Abused: No    Sexually Abused: No    Vital Signs: Height 5\' 3"  (1.6 m), weight 192 lb (87.1 kg), currently breastfeeding. Body mass index is 34.01 kg/m.    Examination: General Appearance: The patient is well-developed, well-nourished, and in no distress. Neck Circumference: - Skin: Gross inspection of skin unremarkable. Head: normocephalic, no gross deformities. Eyes: no gross deformities noted. ENT: ears appear grossly normal Neurologic:  Alert and oriented. No involuntary movements.  STOP BANG RISK ASSESSMENT S (snore) Have you been told that you snore?     YES/N   T (tired) Are you often tired, fatigued, or sleepy during the day?   YES/NO  O (obstruction) Do you stop breathing, choke, or gasp during sleep? YES/NO   P (pressure) Do you have or are you being treated for high blood pressure? YES/NO   B (BMI) Is your body index greater than 35 kg/m? YES/NO   A (age) Are you 24 years old or older? YES/NO   N (neck) Do you have a neck circumference greater than 16 inches?   YES/NO   G (gender) Are you a female? YES/NO   TOTAL STOP/BANG "YES" ANSWERS        A STOP-Bang score of 2 or less is considered low risk, and a score of 5 or more is high risk for having either moderate or severe OSA. For people who score  3 or 4, doctors may need to perform further assessment to determine how likely they are to have OSA.         EPWORTH SLEEPINESS SCALE:  Scale:  (0)= no chance of dozing; (1)= slight chance of dozing; (2)= moderate chance of dozing; (3)= high chance of dozing  Chance  Situtation    Sitting and reading: 3    Watching TV: 0    Sitting Inactive in public: 0    As a passenger in car: 1      Lying down to rest: 3    Sitting and talking: 0    Sitting quielty after lunch: 0    In a car, stopped in traffic: 0   TOTAL SCORE:   7 out of 24    SLEEP STUDIES:  PSG (01/15/20)- Reevaluation after childbirth recommended. REM dependent OSA. Overall AHI 2.4/hr PSG ( 10/10/21) AHI 9.4, min SPO2 83% CPAP ( 10/24/21) CPAP@9cmh20    CPAP COMPLIANCE DATA:  Date Range: -  Average Daily Use: - hours  Median Use: -  Compliance for > 4 Hours: - days  AHI: - respiratory events per hour  Days Used: -  Mask Leak: -  95th Percentile Pressure: -         LABS: No results found for this or any previous visit (from the past 2160 hour(s)).  Radiology: No results found.  No results found.  No results  found.    Assessment and Plan: Patient Active Problem List   Diagnosis Date Noted   Iron deficiency anemia 02/12/2020   Single liveborn infant, delivered by cesarean    Encounter for care or examination of lactating mother    Postpartum care following cesarean delivery    Supervision of high risk pregnancy in third trimester 01/29/2020   Insulin controlled gestational diabetes mellitus (GDM) in third trimester    GERD (gastroesophageal reflux disease) 01/22/2020   Labor and delivery, indication for care 01/07/2020   [redacted] weeks gestation of pregnancy    Thyromegaly 12/24/2019   B12 deficiency 12/19/2019   Normocytic anemia 12/17/2019   Severe persistent asthma without complication 99991111   Amenorrhea 10/16/2019   Pregestational diabetes mellitus, modified White class B 08/02/2019   BMI 39.0-39.9,adult 07/19/2019   History of 2 cesarean sections 07/10/2019   Obesity affecting pregnancy, antepartum 11/19/2017   Supervision of high risk pregnancy, antepartum 11/19/2017   PCOD (polycystic ovarian disease) 10/22/2014   GOITER, UNSPECIFIED 05/03/2007    1. OSA (obstructive sleep apnea) The patient Is not using pap therapy as she has had trouble tolerating it due to feelings of suffocation and claustrophobia. She has had bariatric surgery and lost 30 lbs. Recommend repeat PSG to determine if she still has apnea, as she reports her symptoms have resolved.   2. Obesity (BMI 30-39.9) Obesity Counseling: Had a lengthy discussion regarding patients BMI and weight issues. Patient was instructed on portion control as well as increased activity. Also discussed caloric restrictions with trying to maintain intake less than 2000 Kcal. Discussions were made in accordance with the 5As of weight management. Simple actions such as not eating late and if able to, taking a walk is suggested.      General Counseling: I have discussed the findings of the evaluation and examination with [place].  I have  also discussed any further diagnostic evaluation thatmay be needed or ordered today. [place] verbalizes understanding of the findings of todays visit. We also reviewed her medications today and discussed drug interactions and side effects  including but not limited excessive drowsiness and altered mental states. We also discussed that there is always a risk not just to her but also people around her. she has been encouraged to call the office with any questions or concerns that should arise related to todays visit.  No orders of the defined types were placed in this encounter.   I have personally obtained a history, examined the patient, evaluated laboratory and imaging results, formulated the assessment and plan and placed orders. This patient was seen today by Tressie Ellis, PA-C in collaboration with Dr. Devona Konig.   Allyne Gee, MD [facility] Diplomate ABMS Pulmonary Critical Care Medicine and Sleep Medicine

## 2022-04-26 ENCOUNTER — Ambulatory Visit (INDEPENDENT_AMBULATORY_CARE_PROVIDER_SITE_OTHER): Payer: BC Managed Care – PPO | Admitting: Internal Medicine

## 2022-04-26 VITALS — Ht 63.0 in | Wt 192.0 lb

## 2022-04-26 DIAGNOSIS — E669 Obesity, unspecified: Secondary | ICD-10-CM | POA: Diagnosis not present

## 2022-04-26 DIAGNOSIS — G4733 Obstructive sleep apnea (adult) (pediatric): Secondary | ICD-10-CM | POA: Diagnosis not present

## 2022-07-03 IMAGING — US US MFM OB FOLLOW-UP
1 series · 14 of 28 positions shown · non-contrast
Comparison: none

PATIENT INFO:

PERFORMED BY:
                   Nanda"Ditte Amalie"                         KOSAL
SERVICE(S) PROVIDED:
INDICATIONS:
 33 weeks gestation of pregnancy
 Obesity complicating pregnancy, third
 trimester
FETAL EVALUATION:
 Num Of Fetuses:          1
 Fetal Heart              141
 Rate(bpm):
 Cardiac Activity:        Present
 Presentation:            Vertex
 Placenta:                Posterior
 AFI Sum(cm)     %Tile       Largest Pocket(cm)
 16.2            58
 RUQ(cm)       RLQ(cm)       LUQ(cm)        LLQ(cm)
BIOMETRY:
 BPD:      82.2  mm     G. Age:  33w 1d         34  %    CI:         70.93  %    70 - 86
                                                         FL/HC:       20.3  %    19.9 -
 HC:        311  mm     G. Age:  34w 6d         48  %    HC/AC:       1.05       0.96 -
 AC:      297.1  mm     G. Age:  33w 5d         60  %    FL/BPD:      76.8  %    71 - 87
 FL:       63.1  mm     G. Age:  32w 5d         21  %    FL/AC:       21.2  %    20 - 24
 Est. FW:    1192   g    4 lb 13 oz      44  %
                    m
GESTATIONAL AGE:
 LMP:           35w 3d        Date:  04/23/19                 EDD:    01/28/20
 U/S Today:     33w 4d                                        EDD:    02/10/20
 Best:          33w 3d     Det. By:  Early Ultrasound         EDD:    02/11/20
                                     (07/19/19)
ANATOMY:
 Cavum:                 Visualized             Stomach:                Seen
                        previously
 Ventricles:            Normal appearance      Abdominal Wall:         Visualized
                                                                       previously
 Cerebellum:            Visualized             Cord Vessels:           3 vessels,
                        previously                                     visualized previously
 Posterior Fossa:       Visualized             Kidneys:                Normal appearance
 Face:                  Orbits visualized      Bladder:                Seen
 Lips:                  Visualized             Spine:                  Visualized
                        previously                                     previously
 Heart:                 4-Chamber view         Upper Extremities:      Visualized
                        appears normal                                 previously
 RVOT:                  Normal appearance      Lower Extremities:      Visualized
 LVOT:                  Normal appearance

[Series 1: us mfm ob follow-up · 14 of 36 slices shown]
[im 2/36]
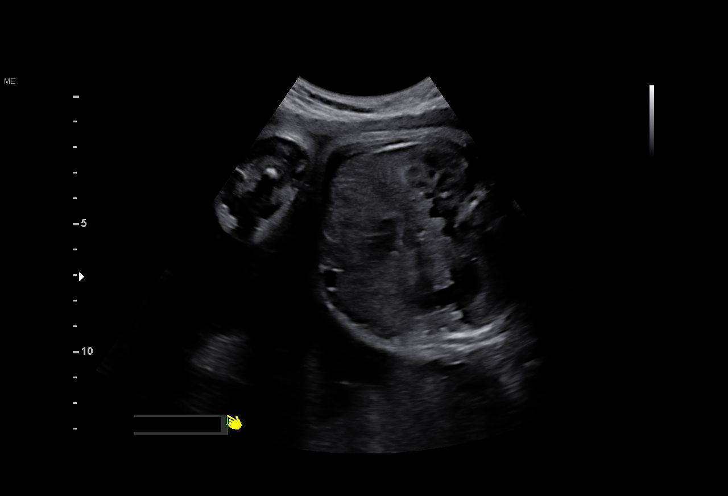
[im 4/36]
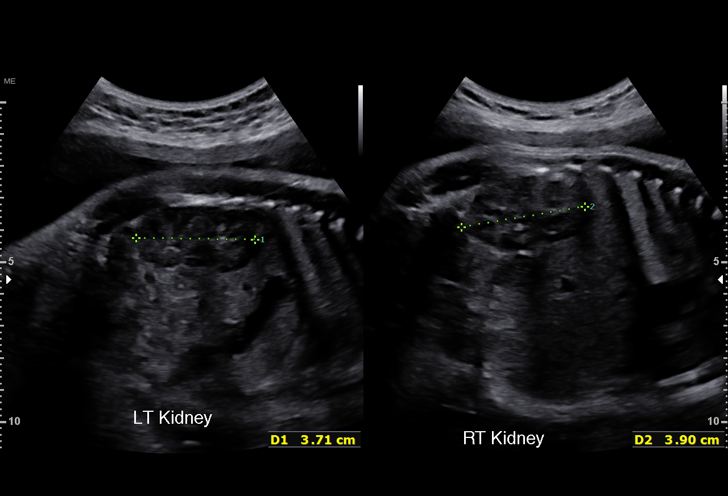
[im 7/36]
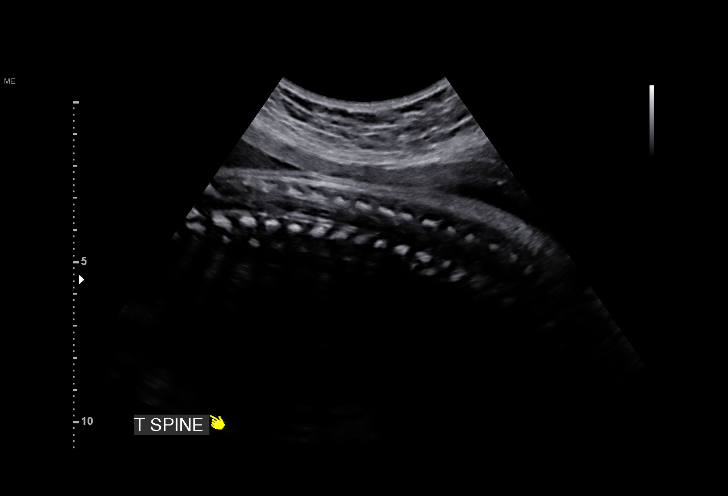
[im 10/36]
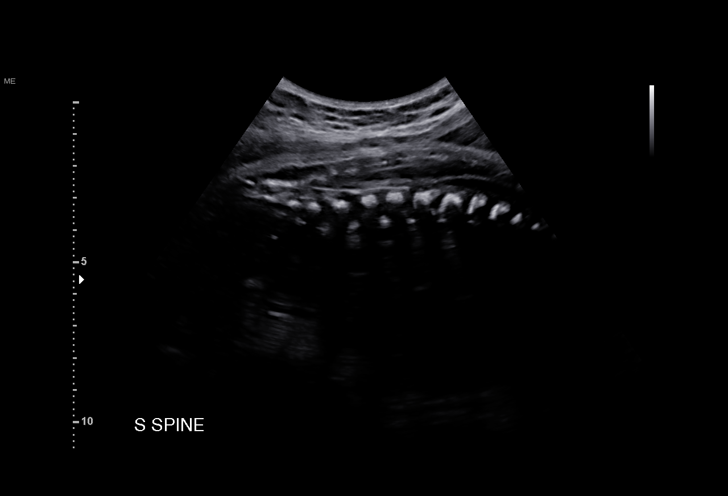
[im 12/36]
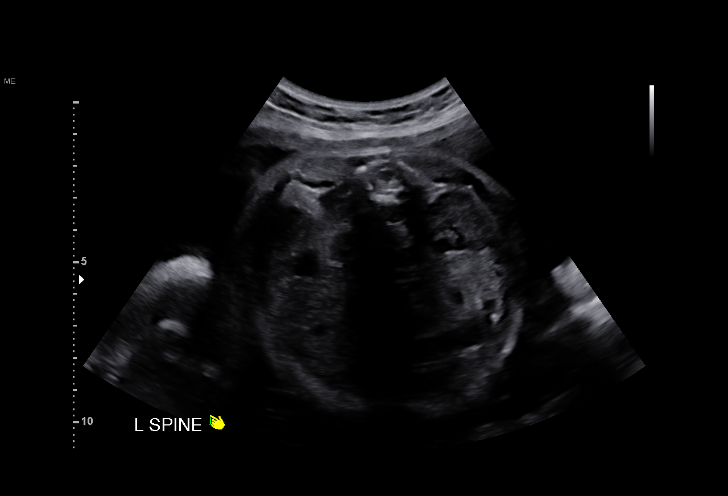
[im 15/36]
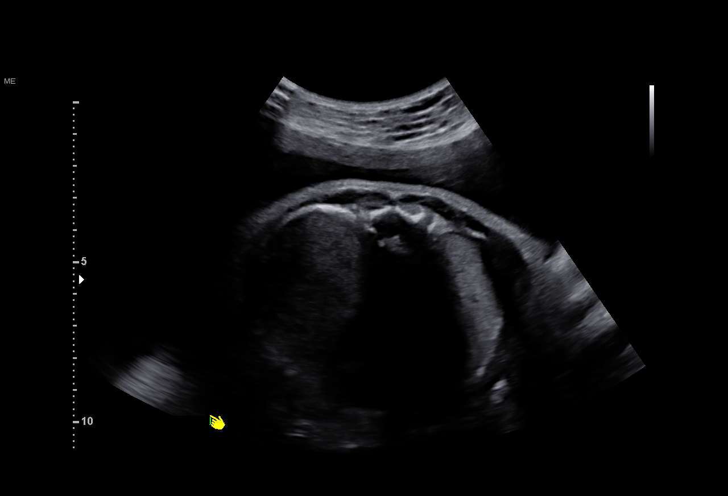
[im 17/36]
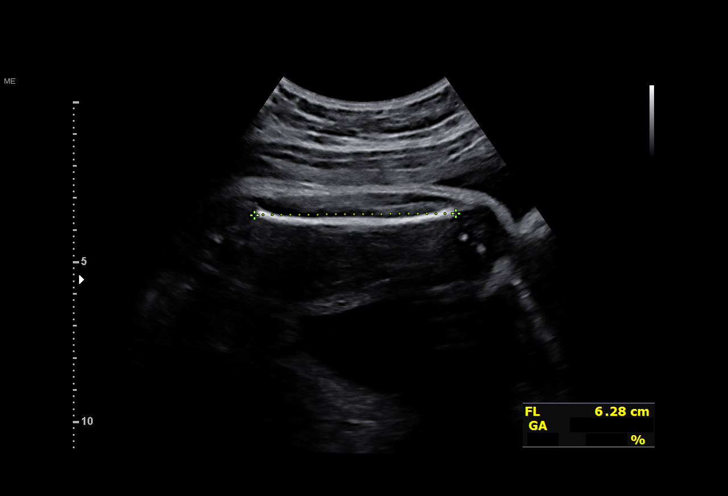
[im 20/36]
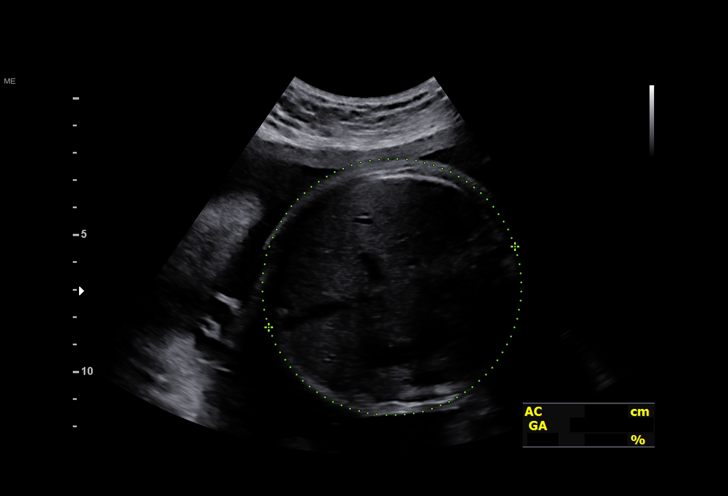
[im 23/36]
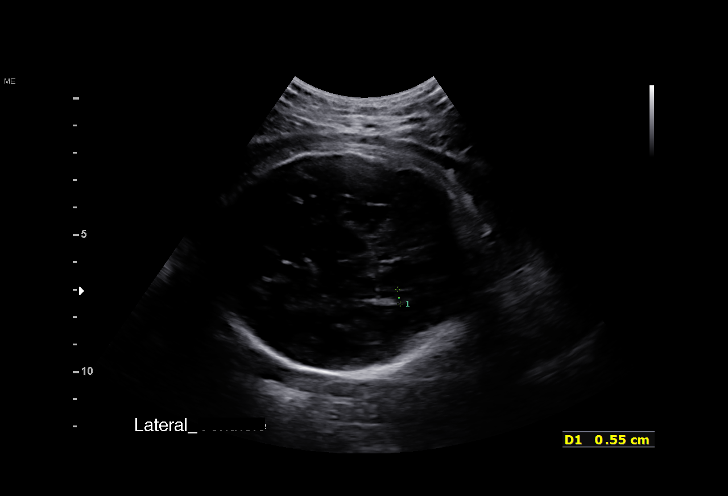
[im 25/36]
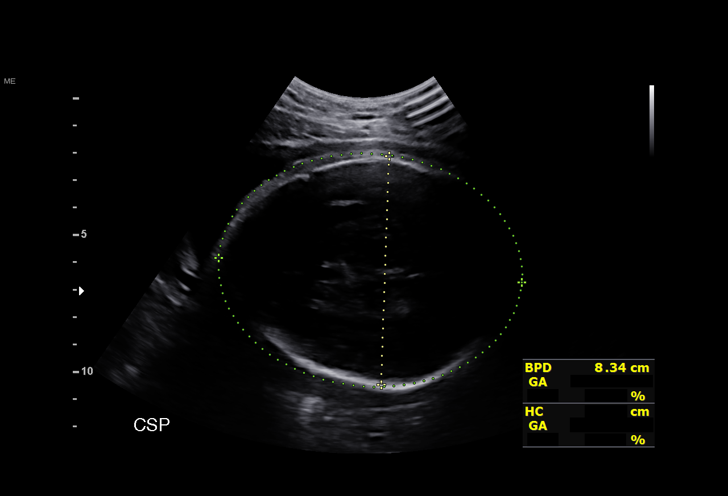
[im 28/36]
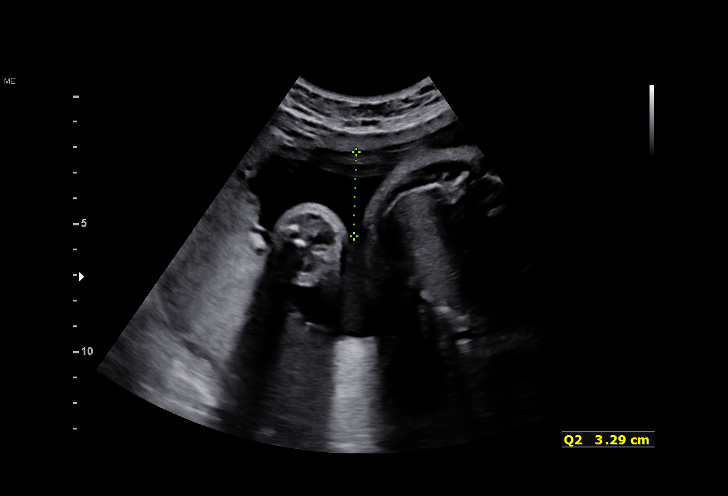
[im 30/36]
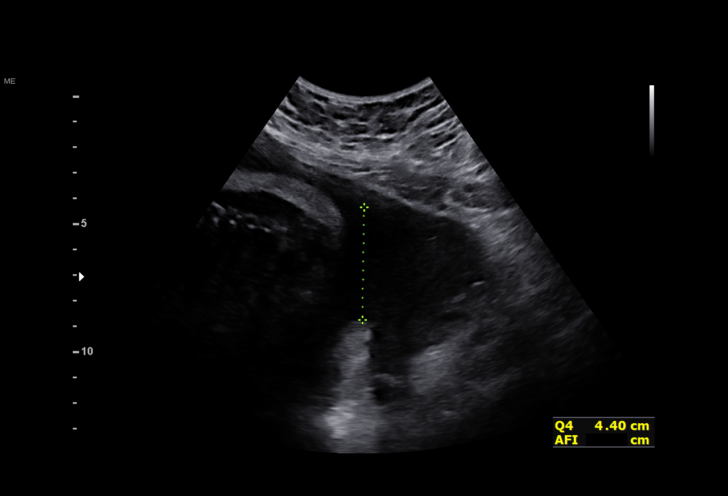
[im 33/36]
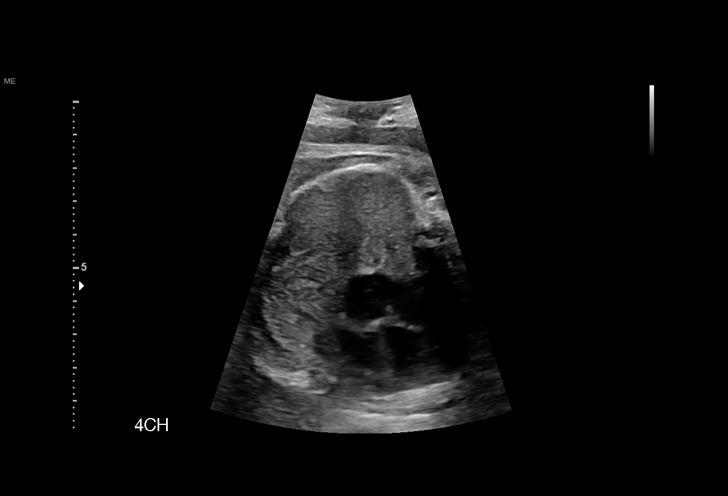
[im 36/36]
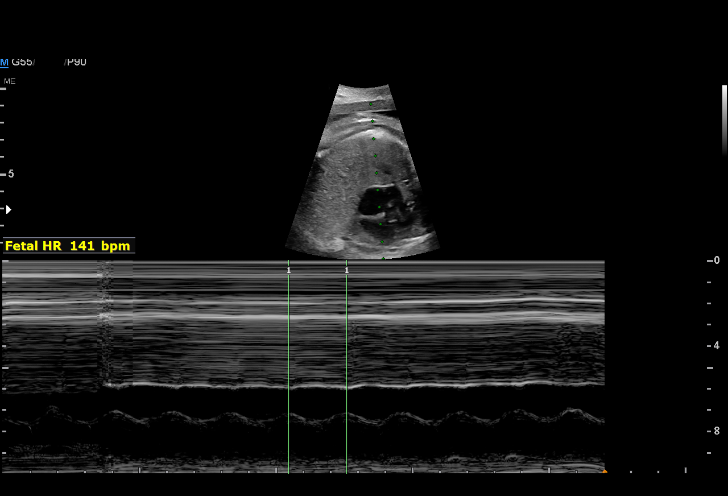

[14 of 28 positions shown; findings below may reference images not displayed]

IMPRESSION: Known CIF03 and a BMI of 44
 Normal interval growth with measurements consistent with
 dates.
 A biophysical profile of [DATE] was observed with good fetal
 movement and amniotic fluid volume

 In review of Ms. Hovorka blood sugar log >50% of her
 FBS and  2hr pp breakfast readings were elevated. <25% of
 her 2hr pp lunch and dinner were abnormal. She is not active
 and in review of her most recent meals we discussed
 adjustments that would improve her fasting blood sugar. She
 notes that her most recent insulin regimen is NPH am/pm
 34/54 Humalog- 03/30/09.

 He blood pressure is normal at 129/74 today.

 She reports good fetal activity.
RECOMMENDATIONS:

 Continue weekly testing
 Consider delivery between 37-39 weeks, closer to 37 weeks
 for poor blood glucose control or poor fetal testing.
 [id number] to assess overall average blood sugars.
 Consider increasing am/pm NPH if blood sugars aren't
 improved in 3 days.

## 2023-11-12 ENCOUNTER — Ambulatory Visit
Admission: EM | Admit: 2023-11-12 | Discharge: 2023-11-12 | Disposition: A | Discharge status: Home / Self Care | Attending: Emergency Medicine | Admitting: Emergency Medicine

## 2023-11-12 ENCOUNTER — Encounter: Payer: Self-pay | Admitting: Hematology and Oncology

## 2023-11-12 ENCOUNTER — Encounter: Payer: Self-pay | Admitting: Emergency Medicine

## 2023-11-12 DIAGNOSIS — N390 Urinary tract infection, site not specified: Secondary | ICD-10-CM | POA: Insufficient documentation

## 2023-11-12 DIAGNOSIS — R809 Proteinuria, unspecified: Secondary | ICD-10-CM | POA: Diagnosis present

## 2023-11-12 LAB — URINALYSIS, W/ REFLEX TO CULTURE (INFECTION SUSPECTED)
Bilirubin Urine: NEGATIVE
Glucose, UA: NEGATIVE mg/dL
Ketones, ur: NEGATIVE mg/dL
Nitrite: NEGATIVE
Protein, ur: >300 mg/dL — AB
RBC / HPF: >50 RBC/hpf (ref 0–5)
Specific Gravity, Urine: 1.02 (ref 1.005–1.030)
pH: 8.5 — ABNORMAL HIGH (ref 5.0–8.0)

## 2023-11-12 MED ORDER — PHENAZOPYRIDINE HCL 200 MG PO TABS: 200.0000 mg | ORAL_TABLET | Freq: Three times a day (TID) | ORAL | 0 refills | Status: AC

## 2023-11-12 MED ORDER — NITROFURANTOIN MONOHYD MACRO 100 MG PO CAPS: 100.0000 mg | ORAL_CAPSULE | Freq: Two times a day (BID) | ORAL | 0 refills | Status: AC

## 2023-11-12 NOTE — ED Provider Notes (Signed)
 [facility]    CSN: [id number] Arrival date & time: 11/12/23  6962      History   Chief Complaint Chief Complaint  Patient presents with  . Urinary Frequency  . Dysuria    HPI UNICE VANTASSEL is a 38 y.o. female.   HPI  38 year old female with past medical history significant for PCOS, goiter, thyromegaly, GERD, severe persistent asthma, IDA, and OSA presents for evaluation of urinary symptoms that began 3 days ago with burning during urination and urinary frequency.  She also describes significant bladder pressure.  Some vaginal itching has also been present.  She started her menstrual cycle last night but notes that in the previous 2 days she had not noticed any blood in her urine.  She denies any nausea or vomiting.  She has chronic low back pain but has not noticed any increase over her baseline.  She denies any vaginal discharge.  Past Medical History:  Diagnosis Date  . Anxiety   . Asthma   . B12 deficiency   . Depression   . Family history of breast cancer    1/21 cancer genetic tesitng letter sent  . GERD (gastroesophageal reflux disease)   . Gestational diabetes   . Iron deficiency anemia   . PCOS (polycystic ovarian syndrome)     Patient Active Problem List   Diagnosis Date Noted  . OSA (obstructive sleep apnea) 04/26/2022  . Iron deficiency anemia 02/12/2020  . Single liveborn infant, delivered by cesarean   . Encounter for care or examination of lactating mother   . Postpartum care following cesarean delivery   . Supervision of high risk pregnancy in third trimester 01/29/2020  . Insulin  controlled gestational diabetes mellitus (GDM) in third trimester   . GERD (gastroesophageal reflux disease) 01/22/2020  . Labor and delivery, indication for care 01/07/2020  . [redacted] weeks gestation of pregnancy   . Thyromegaly 12/24/2019  . B12 deficiency 12/19/2019  . Normocytic anemia 12/17/2019  . Severe persistent asthma without complication  11/14/2019  . Amenorrhea 10/16/2019  . Pregestational diabetes mellitus, modified White class B 08/02/2019  . Obesity (BMI 30-39.9) 07/19/2019  . History of 2 cesarean sections 07/10/2019  . Obesity affecting pregnancy, antepartum 11/19/2017  . Supervision of high risk pregnancy, antepartum 11/19/2017  . PCOD (polycystic ovarian disease) 10/22/2014  . Goiter 05/03/2007    Past Surgical History:  Procedure Laterality Date  . CESAREAN SECTION  2013  . CESAREAN SECTION N/A 07/25/2018   Procedure: CESAREAN SECTION;  Surgeon: Heron Lord, MD;  Location: [facility] ORS;  Service: Obstetrics;  Laterality: N/A;  . CESAREAN SECTION WITH BILATERAL TUBAL LIGATION Bilateral 01/29/2020   Procedure: CESAREAN SECTION WITH BILATERAL TUBAL LIGATION;  Surgeon: Heron Lord, MD;  Location: [facility] ORS;  Service: Obstetrics;  Laterality: Bilateral;    OB History     Gravida  3   Para  3   Term  3   Preterm      AB      Living  3      SAB      IAB      Ectopic      Multiple  0   Live Births  3        Obstetric Comments  IOL with G1 for PUPPS          Home Medications    Prior to Admission medications   Medication Sig Start Date End Date Taking? Authorizing Provider  nitrofurantoin, macrocrystal-monohydrate, (MACROBID) 100  MG capsule Take 1 capsule (100 mg total) by mouth 2 (two) times daily. 11/12/23  Yes Kent Pear, NP  phenazopyridine (PYRIDIUM) 200 MG tablet Take 1 tablet (200 mg total) by mouth 3 (three) times daily. 11/12/23  Yes Kent Pear, NP  famotidine  (PEPCID ) 20 MG tablet TAKE 1 TABLET BY MOUTH EVERYDAY AT BEDTIME Patient not taking: Reported on 03/11/2020 02/15/20   Antonio Baumgarten, NP  ferrous sulfate  325 (65 FE) MG tablet Take 325 mg by mouth 2 (two) times daily with a meal.     [provider]  Lancets (ONETOUCH ULTRASOFT) lancets Use as instructed Patient not taking: Reported on 02/05/2020 08/10/19   Gutierrez, Colleen, CNM  montelukast   (SINGULAIR ) 10 MG tablet Take 1 tablet (10 mg total) by mouth daily. Patient not taking: Reported on 03/11/2020 01/02/20 04/01/20  Marc Senior, MD  [facility] VERIO test strip TEST BLOOD SUGAR 4 TIMES DAILY Patient not taking: Reported on 02/05/2020 01/25/20   Avonne Lemons, CNM  Prenatal Vit-Fe Fumarate-FA (MULTIVITAMIN-PRENATAL) 27-0.8 MG TABS tablet Take 1 tablet by mouth daily.  Patient not taking: Reported on 03/11/2020    [provider]  QVAR REDIHALER 40 MCG/ACT inhaler Inhale into the lungs. 08/07/21   [provider]  sertraline  (ZOLOFT ) 50 MG tablet TAKE 1 TABLET BY MOUTH EVERY DAY 03/23/20  Yes Darl Edu, MD    Family History Family History  Problem Relation Age of Onset  . Breast cancer Paternal Grandmother 10  . Diabetes Father   . Diabetes Sister     Social History Social History   Tobacco Use  . Smoking status: Never  . Smokeless tobacco: Never  Vaping Use  . Vaping status: Never Used  Substance Use Topics  . Alcohol use: Not Currently  . Drug use: No     Allergies   Other   Review of Systems Review of Systems  Constitutional:  Negative for fever.  Gastrointestinal:  Positive for abdominal pain. Negative for nausea and vomiting.       Bladder pressure  Genitourinary:  Positive for dysuria, frequency and vaginal pain. Negative for hematuria, urgency and vaginal discharge.       Vaginal itching.  Musculoskeletal:  Negative for back pain.     Physical Exam Triage Vital Signs ED Triage Vitals  Encounter Vitals Group     BP      Systolic BP Percentile      Diastolic BP Percentile      Pulse      Resp      Temp      Temp src      SpO2      Weight      Height      Head Circumference      Peak Flow      Pain Score      Pain Loc      Pain Education      Exclude from Growth Chart    No data found.  Updated Vital Signs BP 108/73 (BP Location: Left Arm)   Pulse 62   Temp 97.9 F (36.6 C) (Oral)   Resp 14   Ht  5\' 3"  (1.6 m)   Wt 171 lb (77.6 kg)   LMP 11/11/2023 (Exact Date)   SpO2 100%   Breastfeeding No   BMI 30.29 kg/m   Visual Acuity Right Eye Distance:   Left Eye Distance:   Bilateral Distance:    Right Eye Near:   Left Eye Near:  Bilateral Near:     Physical Exam   UC Treatments / Results  Labs (all labs ordered are listed, but only abnormal results are displayed) Labs Reviewed  URINALYSIS, W/ REFLEX TO CULTURE (INFECTION SUSPECTED) - Abnormal; Notable for the following components:      Result Value   APPearance CLOUDY (*)    pH 8.5 (*)    Hgb urine dipstick MODERATE (*)    Protein, ur >300 (*)    Leukocytes,Ua TRACE (*)    Bacteria, UA FEW (*)    All other components within normal limits  URINE CULTURE    EKG   Radiology No results found.  Procedures Procedures (including critical care time)  Medications Ordered in UC Medications - No data to display  Initial Impression / Assessment and Plan / UC Course  I have reviewed the triage vital signs and the nursing notes.  Pertinent labs & imaging results that were available during my care of the patient were reviewed by me and considered in my medical decision making (see chart for details).  Clinical Course as of 11/14/23 0748  Mon Nov 14, 2023  0746 Organism ID, Bacteria(!): STAPHYLOCOCCUS SAPROPHYTICUS [JR]  0747 Culture(!): >=100,000 COLONIES/mL STAPHYLOCOCCUS SAPROPHYTICUS [JR]    Clinical Course User Index [JR] Kent Pear, NP  Patient is a pleasant, nontoxic-appearing 38 year old female presenting for evaluation of UTI symptoms as outlined HPI above.  The patient is not in any acute distress in the exam room.  She has no CVA tenderness on exam.  I will order a urinalysis to evaluate for the presence of UTI.  Urinalysis shows a cloudy appearance with moderate hemoglobin, trace leukocyte esterase but negative for nitrites.  pH is elevated 8.5.  Protein >300.  Reflex microscopic evaluation shows few  bacteria with calcium oxalate crystals present, mucus present, >50 RBCs, 11-20 WBCs.  Urine will reflex to culture.  Review of patient's previous urinalysis shows that patient has had protein in her urine before varying from trace amounts on up to 100 mg/dL.  Patient had blood work performed at [facility] in February which showed normal renal function.  10/14/2023 urinalysis that showed trace protein.  I suspect that the proteinuria is secondary to infection.  I will treat the patient for her UTI with Macrobid 100 mg twice daily for 5 days and Pyridium 200 mg every 8 hours to help with urinary discomfort.  I will encourage the patient to follow-up with her PCP for repeat urinalysis after she completes therapy to ensure resolution of proteinuria.  Urine culture grew out Staphylococcus saprophyticus which shows sensitivity to nitrofurantoin.  No change in antibiotic therapy needed at this time.   Final Clinical Impressions(s) / UC Diagnoses   Final diagnoses:  Lower urinary tract infectious disease  Proteinuria, unspecified type     Discharge Instructions      Take the Macrobid twice daily for 5 days with food for treatment of urinary tract infection.  Use the Pyridium every 8 hours as needed for urinary discomfort.  This will turn your urine a bright red-orange.  Increase your oral fluid intake so that you increase your urine production and or flushing your urinary system.  Take an over-the-counter probiotic, such as Culturelle-Align-Activia, 1 hour after each dose of antibiotic to prevent diarrhea or yeast infections from forming.  We will culture urine and change the antibiotics if necessary.  Return for reevaluation, or see your primary care provider, for any new or worsening symptoms.   Your urine also showed that  you have a significant amount of protein in your urine.  This may be coming from the and infection as a review of your records show that you had protein in your urine previously.   I would encourage you to make an appointment with your PCP for follow-up after you complete antibiotic therapy for repeat urinalysis to ensure the resolution of the proteinuria.   ED Prescriptions     Medication Sig Dispense Auth. Provider   nitrofurantoin, macrocrystal-monohydrate, (MACROBID) 100 MG capsule Take 1 capsule (100 mg total) by mouth 2 (two) times daily. 10 capsule Kent Pear, NP   phenazopyridine (PYRIDIUM) 200 MG tablet Take 1 tablet (200 mg total) by mouth 3 (three) times daily. 6 tablet Kent Pear, NP      PDMP not reviewed this encounter.   Kent Pear, NP 11/12/23 1610    Kent Pear, NP 11/14/23 [phone]

## 2023-11-12 NOTE — ED Triage Notes (Signed)
 Patient c/o dysuria, urinary frequency and bladder pressure that started 2-3 days ago.  Patient states that she has started her menstrual cycle.

## 2023-11-12 NOTE — Discharge Instructions (Addendum)
 Take the Macrobid twice daily for 5 days with food for treatment of urinary tract infection.  Use the Pyridium every 8 hours as needed for urinary discomfort.  This will turn your urine a bright red-orange.  Increase your oral fluid intake so that you increase your urine production and or flushing your urinary system.  Take an over-the-counter probiotic, such as Culturelle-Align-Activia, 1 hour after each dose of antibiotic to prevent diarrhea or yeast infections from forming.  We will culture urine and change the antibiotics if necessary.  Return for reevaluation, or see your primary care provider, for any new or worsening symptoms.   Your urine also showed that you have a significant amount of protein in your urine.  This may be coming from the and infection as a review of your records show that you had protein in your urine previously.  I would encourage you to make an appointment with your PCP for follow-up after you complete antibiotic therapy for repeat urinalysis to ensure the resolution of the proteinuria.

## 2023-11-13 ENCOUNTER — Encounter: Payer: Self-pay | Admitting: Hematology and Oncology

## 2023-11-14 ENCOUNTER — Ambulatory Visit (HOSPITAL_COMMUNITY): Payer: Self-pay

## 2023-11-14 LAB — URINE CULTURE
